# Patient Record
Sex: Female | Born: 1935
Health system: Southern US, Community
[De-identification: ages and names within clinical notes are randomized; demographics above are authoritative.]

## PROBLEM LIST (undated history)

## (undated) DIAGNOSIS — E7800 Pure hypercholesterolemia, unspecified: Secondary | ICD-10-CM

## (undated) DIAGNOSIS — F0390 Unspecified dementia without behavioral disturbance: Secondary | ICD-10-CM

## (undated) DIAGNOSIS — C801 Malignant (primary) neoplasm, unspecified: Secondary | ICD-10-CM

## (undated) DIAGNOSIS — F419 Anxiety disorder, unspecified: Secondary | ICD-10-CM

## (undated) DIAGNOSIS — G4733 Obstructive sleep apnea (adult) (pediatric): Secondary | ICD-10-CM

## (undated) DIAGNOSIS — F329 Major depressive disorder, single episode, unspecified: Secondary | ICD-10-CM

## (undated) DIAGNOSIS — N189 Chronic kidney disease, unspecified: Secondary | ICD-10-CM

## (undated) DIAGNOSIS — M8580 Other specified disorders of bone density and structure, unspecified site: Secondary | ICD-10-CM

## (undated) DIAGNOSIS — I499 Cardiac arrhythmia, unspecified: Secondary | ICD-10-CM

## (undated) DIAGNOSIS — M1990 Unspecified osteoarthritis, unspecified site: Secondary | ICD-10-CM

## (undated) DIAGNOSIS — F32A Depression, unspecified: Secondary | ICD-10-CM

## (undated) DIAGNOSIS — R413 Other amnesia: Secondary | ICD-10-CM

## (undated) HISTORY — DX: Chronic kidney disease, unspecified: N18.9

## (undated) HISTORY — PX: BUNIONECTOMY: SHX129

## (undated) HISTORY — DX: Pure hypercholesterolemia, unspecified: E78.00

## (undated) HISTORY — DX: Obstructive sleep apnea (adult) (pediatric): G47.33

## (undated) HISTORY — DX: Other amnesia: R41.3

## (undated) HISTORY — PX: ANKLE SURGERY: SHX546

## (undated) HISTORY — PX: FRACTURE SURGERY: SHX138

## (undated) HISTORY — DX: Other specified disorders of bone density and structure, unspecified site: M85.80

---
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
## 2009-12-26 ENCOUNTER — Ambulatory Visit (HOSPITAL_COMMUNITY): Admission: RE | Admit: 2009-12-26 | Discharge: 2009-12-26 | Payer: Self-pay | Admitting: Family Medicine

## 2011-01-17 ENCOUNTER — Other Ambulatory Visit (HOSPITAL_COMMUNITY): Payer: Self-pay | Admitting: Family Medicine

## 2011-01-17 DIAGNOSIS — Z1231 Encounter for screening mammogram for malignant neoplasm of breast: Secondary | ICD-10-CM

## 2011-01-29 ENCOUNTER — Ambulatory Visit (HOSPITAL_COMMUNITY)
Admission: RE | Admit: 2011-01-29 | Discharge: 2011-01-29 | Disposition: A | Discharge status: Home / Self Care | Payer: Medicare (Managed Care) | Source: Ambulatory Visit | Attending: Family Medicine | Admitting: Family Medicine

## 2011-01-29 DIAGNOSIS — Z1231 Encounter for screening mammogram for malignant neoplasm of breast: Secondary | ICD-10-CM | POA: Insufficient documentation

## 2012-03-17 ENCOUNTER — Other Ambulatory Visit: Payer: Self-pay | Admitting: Family Medicine

## 2012-03-17 DIAGNOSIS — Z1231 Encounter for screening mammogram for malignant neoplasm of breast: Secondary | ICD-10-CM

## 2012-03-30 ENCOUNTER — Ambulatory Visit: Payer: Medicare (Managed Care)

## 2012-03-31 ENCOUNTER — Ambulatory Visit
Admission: RE | Admit: 2012-03-31 | Discharge: 2012-03-31 | Disposition: A | Discharge status: Home / Self Care | Payer: Medicare Other | Source: Ambulatory Visit | Attending: Family Medicine | Admitting: Family Medicine

## 2012-03-31 DIAGNOSIS — Z1231 Encounter for screening mammogram for malignant neoplasm of breast: Secondary | ICD-10-CM

## 2014-06-14 ENCOUNTER — Other Ambulatory Visit: Payer: Self-pay | Admitting: Family Medicine

## 2014-06-14 DIAGNOSIS — Z1231 Encounter for screening mammogram for malignant neoplasm of breast: Secondary | ICD-10-CM

## 2014-06-29 ENCOUNTER — Ambulatory Visit
Admission: RE | Admit: 2014-06-29 | Discharge: 2014-06-29 | Disposition: A | Discharge status: Home / Self Care | Payer: Medicare Other | Source: Ambulatory Visit | Attending: Family Medicine | Admitting: Family Medicine

## 2014-06-29 DIAGNOSIS — Z1231 Encounter for screening mammogram for malignant neoplasm of breast: Secondary | ICD-10-CM

## 2015-06-30 ENCOUNTER — Other Ambulatory Visit: Payer: Self-pay | Admitting: Family Medicine

## 2015-06-30 DIAGNOSIS — E2839 Other primary ovarian failure: Secondary | ICD-10-CM

## 2015-07-07 ENCOUNTER — Ambulatory Visit
Admission: RE | Admit: 2015-07-07 | Discharge: 2015-07-07 | Disposition: A | Discharge status: Home / Self Care | Payer: Medicare Other | Source: Ambulatory Visit | Attending: Family Medicine | Admitting: Family Medicine

## 2015-07-07 DIAGNOSIS — E2839 Other primary ovarian failure: Secondary | ICD-10-CM

## 2016-12-13 ENCOUNTER — Encounter: Payer: Self-pay | Admitting: *Deleted

## 2016-12-16 ENCOUNTER — Encounter: Payer: Self-pay | Admitting: Diagnostic Neuroimaging

## 2016-12-16 ENCOUNTER — Ambulatory Visit: Payer: Medicare Other | Admitting: Diagnostic Neuroimaging

## 2017-01-08 ENCOUNTER — Encounter: Payer: Self-pay | Admitting: Diagnostic Neuroimaging

## 2017-01-08 ENCOUNTER — Ambulatory Visit (INDEPENDENT_AMBULATORY_CARE_PROVIDER_SITE_OTHER): Payer: PPO | Admitting: Diagnostic Neuroimaging

## 2017-01-08 VITALS — BP 140/84 | HR 86 | Ht 63.0 in | Wt 220.0 lb

## 2017-01-08 DIAGNOSIS — R413 Other amnesia: Secondary | ICD-10-CM | POA: Diagnosis not present

## 2017-01-08 DIAGNOSIS — F03A0 Unspecified dementia, mild, without behavioral disturbance, psychotic disturbance, mood disturbance, and anxiety: Secondary | ICD-10-CM

## 2017-01-08 DIAGNOSIS — F0390 Unspecified dementia without behavioral disturbance: Secondary | ICD-10-CM

## 2017-01-08 NOTE — Patient Instructions (Addendum)
Thank you for coming to see [place] at [facility]. I hope we have been able to provide you high quality care today.  You may receive a patient satisfaction survey over the next few weeks. We would appreciate your feedback and comments so that we may continue to improve ourselves and the health of our patients.   - consider donepezil or memantine  - consider research study  - safety and supervision issues reviewed  - caution with driving; probably to should transition to stop driving   ~~~~~~~~~~~~~~~~~~~~~~~~~~~~~~~~~~~~~~~~~~~~~~~~~~~~~~~~~~~~~~~~~  DR. PENUMALLI'S GUIDE TO HAPPY AND HEALTHY LIVING These are some of my general health and wellness recommendations. Some of them may apply to you better than others. Please use common sense as you try these suggestions and feel free to ask me any questions.   ACTIVITY/FITNESS Mental, social, emotional and physical stimulation are very important for brain and body health. Try learning a new activity (arts, music, language, sports, games).  Keep moving your body to the best of your abilities. You can do this at home, inside or outside, the park, community center, gym or anywhere you like. Consider a physical therapist or personal trainer to get started. Consider the app Sworkit. Fitness trackers such as smart-watches, smart-phones or Fitbits can help as well.   NUTRITION Eat more plants: colorful vegetables, nuts, seeds and berries.  Eat less sugar, salt, preservatives and processed foods.  Avoid toxins such as cigarettes and alcohol.  Drink water when you are thirsty. Warm water with a slice of lemon is an excellent morning drink to start the day.  Consider these websites for more information The Nutrition Source (https://www.henry-hernandez.biz/) Precision Nutrition (WindowBlog.ch)   RELAXATION Consider practicing mindfulness meditation or other relaxation techniques such  as deep breathing, prayer, yoga, tai chi, massage. See website mindful.org or the apps Headspace or Calm to help get started.   SLEEP Try to get at least 7-8+ hours sleep per day. Regular exercise and reduced caffeine will help you sleep better. Practice good sleep hygeine techniques. See website sleep.org for more information.   PLANNING Prepare estate planning, living will, healthcare POA documents. Sometimes this is best planned with the help of an attorney. Theconversationproject.org and agingwithdignity.org are excellent resources.

## 2017-01-08 NOTE — Progress Notes (Signed)
[facility]  PATIENT: Laura Walker DOB: 1930/01/08  REFERRING CLINICIAN: S Wolters HISTORY FROM: patient and daughter [place]) REASON FOR VISIT: new consult / existing patient    HISTORICAL  CHIEF COMPLAINT:  Chief Complaint  Patient presents with  . Dementia    rm 7,  New Pt, dgtr- Libbie, MMSE 27    HISTORY OF PRESENT ILLNESS:   UPDATE 01/08/17: 83 year old female here for evaluation of dementia. Since last visit, patient has had some progression of memory loss over last 5-6 years. This is noted by daughters and son. Still driving short distances. Family slightly concerned about safety, but no accidents recently. Able to do most of her ADLs, but some reminders about hygiene and dressing. Daughter takes care of bills and finances. Patient's other daughter who lives in [place], was visiting a few months ago and concerned about patient's memory problems and dementia symptoms.  PRIOR HPI 10/21/11 (VRP): 83 year old right-handed female with history of hyperlipidemia, seasonal allergies, arthritis, here for evaluation of memory loss.  Patient was previously living independently in [place], [place], then 2 yrs ago moved in with her daughter here in [place].  Around the same time, she and her daughter have noted mild, intermittent short-term memory loss. For example she has difficulty remembering appointment dates, times, names of actors.  She is still able to maintain her activities of daily living.   REVIEW OF SYSTEMS: Full 14 system review of systems performed and negative with exception of: memory loss.   ALLERGIES: Allergies  Allergen Reactions  . Tramadol Nausea Only    HOME MEDICATIONS: No outpatient prescriptions prior to visit.   No facility-administered medications prior to visit.     PAST MEDICAL HISTORY: Past Medical History:  Diagnosis Date  . CKD (chronic kidney disease)   . Hypercholesteremia   . OSA (obstructive sleep apnea)    CPAP    . Osteopenia     PAST SURGICAL HISTORY: Past Surgical History:  Procedure Laterality Date  . ANKLE SURGERY Left    as child, fracture  . BUNIONECTOMY      FAMILY HISTORY: Family History  Problem Relation Age of Onset  . Cancer - Lung Mother   . Heart disease Father   . Atrial fibrillation Sister     SOCIAL HISTORY:  Social History   Social History  . Marital status: Widowed    Spouse name: N/A  . Number of children: 5  . Years of education: 32   Occupational History  .      retired Furniture conservator/restorer   Social History Main Topics  . Smoking status: Former Smoker    Quit date: 12/13/1984  . Smokeless tobacco: Never Used  . Alcohol use Yes     Comment: occas wine  . Drug use: No  . Sexual activity: Not on file   Other Topics Concern  . Not on file   Social History Narrative   Lives with daughter   Caffeine- coffee 2 cups, occas tea     PHYSICAL EXAM  GENERAL EXAM/CONSTITUTIONAL: Vitals:  Vitals:   01/08/17 1507  BP: 140/84  Pulse: 86  Weight: 220 lb (99.8 kg)  Height: '5\' 3"'$  (1.6 m)     Body mass index is 38.97 kg/m.  Visual Acuity Screening   Right eye Left eye Both eyes  Without correction:     With correction: 20/50 20/70   Comments: trifocals    Patient is in no distress; well developed, nourished and groomed; neck is supple  CARDIOVASCULAR:  Examination of carotid arteries is normal; no carotid bruits  Regular rate and rhythm, no murmurs  Examination of peripheral vascular system by observation and palpation is normal  EYES:  Ophthalmoscopic exam of optic discs and posterior segments is normal; no papilledema or hemorrhages  MUSCULOSKELETAL:  Gait, strength, tone, movements noted in Neurologic exam below  NEUROLOGIC: MENTAL STATUS:  MMSE - Mini Mental State Exam 01/08/2017  Orientation to time 4  Orientation to Place 5  Registration 3  Attention/ Calculation 4  Recall 2  Language- name 2 objects 2  Language- repeat 1   Language- follow 3 step command 3  Language- read & follow direction 1  Write a sentence 1  Copy design 1  Total score 27    awake, alert, oriented to person, place and time  recent and remote memory intact  normal attention and concentration  language fluent, comprehension intact, naming intact,   fund of knowledge appropriate  CRANIAL NERVE:   2nd - no papilledema on fundoscopic exam  2nd, 3rd, 4th, 6th - pupils equal and reactive to light, visual fields full to confrontation, extraocular muscles intact, no nystagmus  5th - facial sensation symmetric  7th - facial strength symmetric  8th - hearing intact  9th - palate elevates symmetrically, uvula midline  11th - shoulder shrug symmetric  12th - tongue protrusion midline  MOTOR:   normal bulk and tone, full strength in the BUE, BLE  SENSORY:   normal and symmetric to light touch, temperature, vibration  COORDINATION:   finger-nose-finger, fine finger movements normal  REFLEXES:   deep tendon reflexes TRACE and symmetric  GAIT/STATION:   narrow based gait    DIAGNOSTIC DATA (LABS, IMAGING, TESTING) - I reviewed patient records, labs, notes, testing and imaging myself where available.  No results found for: WBC, HGB, HCT, MCV, PLT No results found for: NA, K, CL, CO2, GLUCOSE, BUN, CREATININE, CALCIUM, PROT, ALBUMIN, AST, ALT, ALKPHOS, BILITOT, GFRNONAA, GFRAA No results found for: CHOL, HDL, LDLCALC, LDLDIRECT, TRIG, CHOLHDL No results found for: HGBA1C No results found for: VITAMINB12 No results found for: TSH   11/07/11  MRI brain (without contrast) demonstrating: 1. Mild perisylvian and mesial temporal atrophy.   2. Moderate chronic small vessel ischemic disease.      ASSESSMENT AND PLAN  83 y.o. year old female here with progressive mild memory loss and cognitive decline since 2010. Patient having some decline in her ADLs. Patient living with daughter in a good supervised and safe  environment. Had long conversation regarding diagnosis, prognosis, treatment options.   Dx: mild dementia  1. Memory loss   2. Mild dementia      PLAN: - consider donepezil or memantine - consider research study - safety and supervision issues reviewed - caution with driving; probably to should transition to stop driving  Return in about 3 months (around 04/07/2017).    Penni Bombard, MD 2/69/4854, 6:27 PM Certified in Neurology, Neurophysiology and Neuroimaging  [facility] [address] [place], [place]  [address] [phone]

## 2017-02-12 DIAGNOSIS — G4733 Obstructive sleep apnea (adult) (pediatric): Secondary | ICD-10-CM | POA: Diagnosis not present

## 2017-02-14 DIAGNOSIS — E559 Vitamin D deficiency, unspecified: Secondary | ICD-10-CM | POA: Diagnosis not present

## 2017-04-15 ENCOUNTER — Ambulatory Visit (INDEPENDENT_AMBULATORY_CARE_PROVIDER_SITE_OTHER): Payer: PPO | Admitting: Diagnostic Neuroimaging

## 2017-04-15 ENCOUNTER — Encounter: Payer: Self-pay | Admitting: Diagnostic Neuroimaging

## 2017-04-15 VITALS — BP 152/84 | HR 71 | Wt 219.6 lb

## 2017-04-15 DIAGNOSIS — R413 Other amnesia: Secondary | ICD-10-CM

## 2017-04-15 DIAGNOSIS — F03A0 Unspecified dementia, mild, without behavioral disturbance, psychotic disturbance, mood disturbance, and anxiety: Secondary | ICD-10-CM

## 2017-04-15 DIAGNOSIS — F0390 Unspecified dementia without behavioral disturbance: Secondary | ICD-10-CM | POA: Diagnosis not present

## 2017-04-15 NOTE — Patient Instructions (Signed)
-   consider research study; (www.[email or web address].clinic for more information)  - caution with driving

## 2017-04-15 NOTE — Progress Notes (Signed)
[facility]  PATIENT: Laura Walker DOB: 1929/12/01  REFERRING CLINICIAN: S Wolters HISTORY FROM: patient REASON FOR VISIT: follow up    HISTORICAL  CHIEF COMPLAINT:  Chief Complaint  Patient presents with  . Memory Loss    rm 7, MMSE 27  . Follow-up    3 month    HISTORY OF PRESENT ILLNESS:   UPDATE 04/15/17: Since last visit, symptoms are stable. Patient here alone for this visit. No new issues or concerns.   UPDATE 01/08/17: 83 year old female here for evaluation of dementia. Since last visit, patient has had some progression of memory loss over last 5-6 years. This is noted by daughters and son. Still driving short distances. Family slightly concerned about safety, but no accidents recently. Able to do most of her ADLs, but some reminders about hygiene and dressing. Daughter takes care of bills and finances. Patient's other daughter who lives in [place], was visiting a few months ago and concerned about patient's memory problems and dementia symptoms.  PRIOR HPI 10/21/11 (VRP): 84 year old right-handed female with history of hyperlipidemia, seasonal allergies, arthritis, here for evaluation of memory loss.  Patient was previously living independently in [place], [place], then 2 yrs ago moved in with her daughter here in [place].  Around the same time, she and her daughter have noted mild, intermittent short-term memory loss. For example she has difficulty remembering appointment dates, times, names of actors.  She is still able to maintain her activities of daily living.   REVIEW OF SYSTEMS: Full 14 system review of systems performed and negative with exception of: memory loss.   ALLERGIES: Allergies  Allergen Reactions  . Tramadol Nausea Only    HOME MEDICATIONS: Outpatient Medications Prior to Visit  Medication Sig Dispense Refill  . Cholecalciferol (VITAMIN D3) 50000 units CAPS Take by mouth.    . Cyanocobalamin (VITAMIN B-12) 5000 MCG LOZG  Take 2,500 mcg by mouth.    . DULoxetine (CYMBALTA) 30 MG capsule 30 mg daily.    . Omega-3 Fatty Acids (FISH OIL ADULT GUMMIES PO) Take by mouth. 1400/900 mg     No facility-administered medications prior to visit.     PAST MEDICAL HISTORY: Past Medical History:  Diagnosis Date  . CKD (chronic kidney disease)   . Hypercholesteremia   . Memory loss   . OSA (obstructive sleep apnea)    CPAP  . Osteopenia     PAST SURGICAL HISTORY: Past Surgical History:  Procedure Laterality Date  . ANKLE SURGERY Left    as child, fracture  . BUNIONECTOMY      FAMILY HISTORY: Family History  Problem Relation Age of Onset  . Cancer - Lung Mother   . Heart disease Father   . Atrial fibrillation Sister     SOCIAL HISTORY:  Social History   Social History  . Marital status: Widowed    Spouse name: N/A  . Number of children: 5  . Years of education: 34   Occupational History  .      retired Furniture conservator/restorer   Social History Main Topics  . Smoking status: Former Smoker    Quit date: 12/13/1984  . Smokeless tobacco: Never Used  . Alcohol use Yes     Comment: occas wine  . Drug use: No  . Sexual activity: Not on file   Other Topics Concern  . Not on file   Social History Narrative   Lives with daughter   Caffeine- coffee 2 cups, occas tea  PHYSICAL EXAM  GENERAL EXAM/CONSTITUTIONAL: Vitals:  Vitals:   04/15/17 1508  BP: (!) 152/84  Pulse: 71  Weight: 219 lb 9.6 oz (99.6 kg)   Body mass index is 38.9 kg/m. No exam data present  Patient is in no distress; well developed, nourished and groomed; neck is supple  CARDIOVASCULAR:  Examination of carotid arteries is normal; no carotid bruits  Regular rate and rhythm, no murmurs  Examination of peripheral vascular system by observation and palpation is normal  EYES:  Ophthalmoscopic exam of optic discs and posterior segments is normal; no papilledema or hemorrhages  MUSCULOSKELETAL:  Gait, strength,  tone, movements noted in Neurologic exam below  NEUROLOGIC: MENTAL STATUS:  MMSE - [place] Exam 04/15/2017 01/08/2017  Orientation to time 5 4  Orientation to Place 5 5  Registration 3 3  Attention/ Calculation 3 4  Recall 2 2  Language- name 2 objects 2 2  Language- repeat 1 1  Language- follow 3 step command 3 3  Language- read & follow direction 1 1  Write a sentence 1 1  Copy design 1 1  Total score 27 27    awake, alert, oriented to person, place and time  recent and remote memory intact  normal attention and concentration  language fluent, comprehension intact, naming intact,   fund of knowledge appropriate  CRANIAL NERVE:   2nd - no papilledema on fundoscopic exam  2nd, 3rd, 4th, 6th - pupils equal and reactive to light, visual fields full to confrontation, extraocular muscles intact, no nystagmus  5th - facial sensation symmetric  7th - facial strength symmetric  8th - hearing intact  9th - palate elevates symmetrically, uvula midline  11th - shoulder shrug symmetric  12th - tongue protrusion midline  MOTOR:   normal bulk and tone, full strength in the BUE, BLE  SENSORY:   normal and symmetric to light touch, temperature  DEC VIB AT HANDS  ABSENT VIB AT TOES  COORDINATION:   finger-nose-finger, fine finger movements normal  REFLEXES:   deep tendon reflexes TRACE and symmetric  GAIT/STATION:   narrow based gait    DIAGNOSTIC DATA (LABS, IMAGING, TESTING) - I reviewed patient records, labs, notes, testing and imaging myself where available.  No results found for: WBC, HGB, HCT, MCV, PLT No results found for: NA, K, CL, CO2, GLUCOSE, BUN, CREATININE, CALCIUM, PROT, ALBUMIN, AST, ALT, ALKPHOS, BILITOT, GFRNONAA, GFRAA No results found for: CHOL, HDL, LDLCALC, LDLDIRECT, TRIG, CHOLHDL No results found for: HGBA1C No results found for: VITAMINB12 No results found for: TSH   11/07/11  MRI brain (without contrast)  demonstrating: 1. Mild perisylvian and mesial temporal atrophy.   2. Moderate chronic small vessel ischemic disease.      ASSESSMENT AND PLAN  83 y.o. year old female here with progressive mild memory loss and cognitive decline since 2010. Patient having some decline in her ADLs. Patient living with daughter in a good supervised and safe environment. Had long conversation regarding diagnosis, prognosis, treatment options.   Dx: mild dementia  1. Memory loss   2. Mild dementia      PLAN: I spent 15 minutes of face to face time with patient. Greater than 50% of time was spent in counseling and coordination of care with patient. In summary we discussed:  - consider research study; patient is interested - safety and supervision issues reviewed - caution with driving; probably to should transition to stop driving  Return in about 4 months (around 08/16/2017). with  daughter     Penni Bombard, MD 05/05/6434, 3:91 PM Certified in Neurology, Neurophysiology and [facility] [address] [place], [place] [address] [phone]

## 2017-07-30 DIAGNOSIS — D692 Other nonthrombocytopenic purpura: Secondary | ICD-10-CM | POA: Diagnosis not present

## 2017-07-30 DIAGNOSIS — E785 Hyperlipidemia, unspecified: Secondary | ICD-10-CM | POA: Diagnosis not present

## 2017-07-30 DIAGNOSIS — Z6838 Body mass index (BMI) 38.0-38.9, adult: Secondary | ICD-10-CM | POA: Diagnosis not present

## 2017-07-30 DIAGNOSIS — Z23 Encounter for immunization: Secondary | ICD-10-CM | POA: Diagnosis not present

## 2017-07-30 DIAGNOSIS — E559 Vitamin D deficiency, unspecified: Secondary | ICD-10-CM | POA: Diagnosis not present

## 2017-07-30 DIAGNOSIS — M1990 Unspecified osteoarthritis, unspecified site: Secondary | ICD-10-CM | POA: Diagnosis not present

## 2017-07-30 DIAGNOSIS — Z79899 Other long term (current) drug therapy: Secondary | ICD-10-CM | POA: Diagnosis not present

## 2017-07-30 DIAGNOSIS — Z0000 Encounter for general adult medical examination without abnormal findings: Secondary | ICD-10-CM | POA: Diagnosis not present

## 2017-07-30 DIAGNOSIS — I10 Essential (primary) hypertension: Secondary | ICD-10-CM | POA: Diagnosis not present

## 2017-08-18 ENCOUNTER — Ambulatory Visit (INDEPENDENT_AMBULATORY_CARE_PROVIDER_SITE_OTHER): Payer: PPO | Admitting: Diagnostic Neuroimaging

## 2017-08-18 ENCOUNTER — Encounter: Payer: Self-pay | Admitting: Diagnostic Neuroimaging

## 2017-08-18 VITALS — BP 164/81 | HR 70 | Ht 63.0 in | Wt 227.0 lb

## 2017-08-18 DIAGNOSIS — R269 Unspecified abnormalities of gait and mobility: Secondary | ICD-10-CM | POA: Diagnosis not present

## 2017-08-18 DIAGNOSIS — R413 Other amnesia: Secondary | ICD-10-CM | POA: Diagnosis not present

## 2017-08-18 DIAGNOSIS — F0390 Unspecified dementia without behavioral disturbance: Secondary | ICD-10-CM

## 2017-08-18 DIAGNOSIS — F03A0 Unspecified dementia, mild, without behavioral disturbance, psychotic disturbance, mood disturbance, and anxiety: Secondary | ICD-10-CM

## 2017-08-18 MED ORDER — MEMANTINE HCL 10 MG PO TABS: 10.0000 mg | ORAL_TABLET | Freq: Two times a day (BID) | ORAL | 12 refills | Status: DC

## 2017-08-18 NOTE — Patient Instructions (Signed)
Thank you for coming to see [place] at [facility]. I hope we have been able to provide you high quality care today.  You may receive a patient satisfaction survey over the next few weeks. We would appreciate your feedback and comments so that we may continue to improve ourselves and the health of our patients.  - start memantine 45m at bedtime; after 1-2 weeks increase to twice a day  - visit [email or web address].[email or web address] for more information on research studies   ~~~~~~~~~~~~~~~~~~~~~~~~~~~~~~~~~~~~~~~~~~~~~~~~~~~~~~~~~~~~~~~~~  DR. Zeppelin Commisso'S GUIDE TO HAPPY AND HEALTHY LIVING These are some of my general health and wellness recommendations. Some of them may apply to you better than others. Please use common sense as you try these suggestions and feel free to ask me any questions.   ACTIVITY/FITNESS Mental, social, emotional and physical stimulation are very important for brain and body health. Try learning a new activity (arts, music, language, sports, games).  Keep moving your body to the best of your abilities.    NUTRITION Eat more plants: colorful vegetables, nuts, seeds and berries.  Eat less sugar, salt, preservatives and processed foods.  Avoid toxins such as cigarettes and alcohol.  Drink water when you are thirsty. Warm water with a slice of lemon is an excellent morning drink to start the day.  Consider these websites for more information The Nutrition Source (hhttps://www.henry-hernandez.biz/ Precision Nutrition (wWindowBlog.ch   RELAXATION Consider practicing mindfulness meditation or other relaxation techniques such as deep breathing, prayer, yoga, tai chi, massage. See website mindful.org or the apps Headspace or Calm to help get started.   SLEEP Try to get at least 7-8+ hours sleep per day. Regular exercise and reduced caffeine will help you sleep better. Practice good sleep hygeine techniques. See website sleep.org for  more information.   PLANNING Prepare estate planning, living will, healthcare POA documents. Sometimes this is best planned with the help of an attorney. Theconversationproject.org and agingwithdignity.org are excellent resources.

## 2017-08-18 NOTE — Progress Notes (Signed)
[facility]  PATIENT: Laura Walker DOB: December 04, 1929  REFERRING CLINICIAN: S Wolters HISTORY FROM: patient and 2 daughters  REASON FOR VISIT: follow up    HISTORICAL  CHIEF COMPLAINT:  Chief Complaint  Patient presents with  . Follow-up  . Memory Loss    stable. 2 daughters with pt.  Had fall, balance.     HISTORY OF PRESENT ILLNESS:   UPDAET 08/18/17: Since last visit, memory loss is stable. She has excellent support from her 2 daughters. Slightly decr insight. She had 1 fall recently, while trying to get up from chair, then right knee gave out slightly. Her daughter was able to slow her fall. No major injuries. Patient using cane most of the time, but not the walker that much.   UPDATE 04/15/17: Since last visit, symptoms are stable. Patient here alone for this visit. No new issues or concerns.   UPDATE 01/08/17: 83 year old female here for evaluation of dementia. Since last visit, patient has had some progression of memory loss over last 5-6 years. This is noted by daughters and son. Still driving short distances. Family slightly concerned about safety, but no accidents recently. Able to do most of her ADLs, but some reminders about hygiene and dressing. Daughter takes care of bills and finances. Patient's other daughter who lives in [place], was visiting a few months ago and concerned about patient's memory problems and dementia symptoms.  PRIOR HPI 10/21/11 (VRP): 83 year old right-handed female with history of hyperlipidemia, seasonal allergies, arthritis, here for evaluation of memory loss.  Patient was previously living independently in [place], [place], then 2 yrs ago moved in with her daughter here in [place].  Around the same time, she and her daughter have noted mild, intermittent short-term memory loss. For example she has difficulty remembering appointment dates, times, names of actors.  She is still able to maintain her activities of daily  living.   REVIEW OF SYSTEMS: Full 14 system review of systems performed and negative with exception of: joint pain incont bladder apnea.     ALLERGIES: Allergies  Allergen Reactions  . Tramadol Nausea Only    HOME MEDICATIONS: Outpatient Medications Prior to Visit  Medication Sig Dispense Refill  . Cyanocobalamin (VITAMIN B-12) 5000 MCG LOZG Take 2,500 mcg by mouth.    . DULoxetine (CYMBALTA) 30 MG capsule 30 mg daily.    . Omega-3 Fatty Acids (FISH OIL ADULT GUMMIES PO) Take by mouth. 1400/900 mg    . Cholecalciferol (VITAMIN D3) 50000 units CAPS Take by mouth.     No facility-administered medications prior to visit.     PAST MEDICAL HISTORY: Past Medical History:  Diagnosis Date  . CKD (chronic kidney disease)   . Hypercholesteremia   . Memory loss   . OSA (obstructive sleep apnea)    CPAP  . Osteopenia     PAST SURGICAL HISTORY: Past Surgical History:  Procedure Laterality Date  . ANKLE SURGERY Left    as child, fracture  . BUNIONECTOMY      FAMILY HISTORY: Family History  Problem Relation Age of Onset  . Cancer - Lung Mother   . Heart disease Father   . Atrial fibrillation Sister     SOCIAL HISTORY:  Social History   Social History  . Marital status: Widowed    Spouse name: N/A  . Number of children: 5  . Years of education: 59   Occupational History  .      retired Furniture conservator/restorer   Social History Main  Topics  . Smoking status: Former Smoker    Quit date: 12/13/1984  . Smokeless tobacco: Never Used  . Alcohol use Yes     Comment: occas wine  . Drug use: No  . Sexual activity: Not on file   Other Topics Concern  . Not on file   Social History Narrative   Lives with daughter   Caffeine- coffee 2 cups, occas tea     PHYSICAL EXAM  GENERAL EXAM/CONSTITUTIONAL: Vitals:  Vitals:   08/18/17 1400  BP: (!) 164/81  Pulse: 70  Weight: 227 lb (103 kg)  Height: 5\' 3"  (1.6 m)   Body mass index is 40.21 kg/m. No exam data  present  Patient is in no distress; well developed, nourished and groomed; neck is supple  CARDIOVASCULAR:  Examination of carotid arteries is normal; no carotid bruits  Regular rate and rhythm, no murmurs  Examination of peripheral vascular system by observation and palpation is normal  EYES:  Ophthalmoscopic exam of optic discs and posterior segments is normal; no papilledema or hemorrhages  MUSCULOSKELETAL:  Gait, strength, tone, movements noted in Neurologic exam below  NEUROLOGIC: MENTAL STATUS:  MMSE - [place] Exam 08/18/2017 04/15/2017 01/08/2017  Orientation to time 5 5 4   Orientation to Place 5 5 5   Registration 3 3 3   Attention/ Calculation 2 3 4   Recall 2 2 2   Language- name 2 objects 2 2 2   Language- repeat 1 1 1   Language- follow 3 step command 3 3 3   Language- read & follow direction 1 1 1   Write a sentence 1 1 1   Copy design 1 1 1   Total score 26 27 27     awake, alert, oriented to person, place and time  recent and remote memory intact  normal attention and concentration  language fluent, comprehension intact, naming intact,   fund of knowledge appropriate  CRANIAL NERVE:   2nd - no papilledema on fundoscopic exam  2nd, 3rd, 4th, 6th - pupils equal and reactive to light, visual fields full to confrontation, extraocular muscles intact, no nystagmus  5th - facial sensation symmetric  7th - facial strength symmetric  8th - hearing intact  9th - palate elevates symmetrically, uvula midline  11th - shoulder shrug symmetric  12th - tongue protrusion midline  MOTOR:   normal bulk and tone, full strength in the BUE, BLE  SENSORY:   normal and symmetric to light touch, temperature  DEC VIB AT HANDS  ABSENT VIB AT TOES  COORDINATION:   finger-nose-finger, fine finger movements normal  REFLEXES:   deep tendon reflexes TRACE and symmetric  GAIT/STATION:   narrow based gait    DIAGNOSTIC DATA (LABS, IMAGING,  TESTING) - I reviewed patient records, labs, notes, testing and imaging myself where available.  No results found for: WBC, HGB, HCT, MCV, PLT No results found for: NA, K, CL, CO2, GLUCOSE, BUN, CREATININE, CALCIUM, PROT, ALBUMIN, AST, ALT, ALKPHOS, BILITOT, GFRNONAA, GFRAA No results found for: CHOL, HDL, LDLCALC, LDLDIRECT, TRIG, CHOLHDL No results found for: HGBA1C No results found for: VITAMINB12 No results found for: TSH   11/07/11  MRI brain (without contrast) demonstrating: 1. Mild perisylvian and mesial temporal atrophy.   2. Moderate chronic small vessel ischemic disease.      ASSESSMENT AND PLAN  83 y.o. year old female here with progressive mild memory loss and cognitive decline since 2010. Patient having some decline in her ADLs. Patient living with daughter in a good supervised and safe environment.  Had long conversation regarding diagnosis, prognosis, treatment options.   Dx: mild dementia  1. Memory loss   2. Mild dementia   3. Gait difficulty      PLAN:  I spent 25 minutes of face to face time with patient. Greater than 50% of time was spent in counseling and coordination of care with patient. In summary we discussed:   MILD DEMENTIA - start memantine 10mg  twice a day  - consider research study; patient and family are interested - safety and supervision issues reviewed; use walker - caution with driving; probably to should transition to stop driving  HYPERTENSION - follow up BP with PCP   Meds ordered this encounter  Medications  . memantine (NAMENDA) 10 MG tablet    Sig: Take 1 tablet (10 mg total) by mouth 2 (two) times daily.    Dispense:  60 tablet    Refill:  12   Return in about 6 months (around 02/15/2018).    Penni Bombard, MD 07/08/4817, 5:63 PM Certified in Neurology, Neurophysiology and Neuroimaging  [facility] [address] [place], [place] [address] [phone]

## 2017-09-30 DIAGNOSIS — G4733 Obstructive sleep apnea (adult) (pediatric): Secondary | ICD-10-CM | POA: Diagnosis not present

## 2017-11-29 DIAGNOSIS — I129 Hypertensive chronic kidney disease with stage 1 through stage 4 chronic kidney disease, or unspecified chronic kidney disease: Secondary | ICD-10-CM | POA: Diagnosis not present

## 2017-11-29 DIAGNOSIS — R7303 Prediabetes: Secondary | ICD-10-CM | POA: Diagnosis not present

## 2017-11-29 DIAGNOSIS — D692 Other nonthrombocytopenic purpura: Secondary | ICD-10-CM | POA: Diagnosis not present

## 2017-11-29 DIAGNOSIS — M19071 Primary osteoarthritis, right ankle and foot: Secondary | ICD-10-CM | POA: Diagnosis not present

## 2017-11-29 DIAGNOSIS — Z87891 Personal history of nicotine dependence: Secondary | ICD-10-CM | POA: Diagnosis not present

## 2017-11-29 DIAGNOSIS — F0390 Unspecified dementia without behavioral disturbance: Secondary | ICD-10-CM | POA: Diagnosis not present

## 2017-11-29 DIAGNOSIS — N2581 Secondary hyperparathyroidism of renal origin: Secondary | ICD-10-CM | POA: Diagnosis not present

## 2017-11-29 DIAGNOSIS — E7800 Pure hypercholesterolemia, unspecified: Secondary | ICD-10-CM | POA: Diagnosis not present

## 2017-11-29 DIAGNOSIS — Z6841 Body Mass Index (BMI) 40.0 and over, adult: Secondary | ICD-10-CM | POA: Diagnosis not present

## 2017-11-29 DIAGNOSIS — M8580 Other specified disorders of bone density and structure, unspecified site: Secondary | ICD-10-CM | POA: Diagnosis not present

## 2017-11-29 DIAGNOSIS — E559 Vitamin D deficiency, unspecified: Secondary | ICD-10-CM | POA: Diagnosis not present

## 2017-11-29 DIAGNOSIS — N183 Chronic kidney disease, stage 3 (moderate): Secondary | ICD-10-CM | POA: Diagnosis not present

## 2017-11-29 DIAGNOSIS — M170 Bilateral primary osteoarthritis of knee: Secondary | ICD-10-CM | POA: Diagnosis not present

## 2017-11-29 DIAGNOSIS — M19072 Primary osteoarthritis, left ankle and foot: Secondary | ICD-10-CM | POA: Diagnosis not present

## 2017-11-29 DIAGNOSIS — G4733 Obstructive sleep apnea (adult) (pediatric): Secondary | ICD-10-CM | POA: Diagnosis not present

## 2017-11-29 DIAGNOSIS — H9190 Unspecified hearing loss, unspecified ear: Secondary | ICD-10-CM | POA: Diagnosis not present

## 2017-12-02 DIAGNOSIS — W19XXXA Unspecified fall, initial encounter: Secondary | ICD-10-CM | POA: Diagnosis not present

## 2017-12-02 DIAGNOSIS — R5381 Other malaise: Secondary | ICD-10-CM | POA: Diagnosis not present

## 2017-12-02 DIAGNOSIS — E559 Vitamin D deficiency, unspecified: Secondary | ICD-10-CM | POA: Diagnosis not present

## 2017-12-02 DIAGNOSIS — M1990 Unspecified osteoarthritis, unspecified site: Secondary | ICD-10-CM | POA: Diagnosis not present

## 2017-12-02 DIAGNOSIS — Z79899 Other long term (current) drug therapy: Secondary | ICD-10-CM | POA: Diagnosis not present

## 2017-12-02 DIAGNOSIS — F0390 Unspecified dementia without behavioral disturbance: Secondary | ICD-10-CM | POA: Diagnosis not present

## 2017-12-08 ENCOUNTER — Ambulatory Visit (INDEPENDENT_AMBULATORY_CARE_PROVIDER_SITE_OTHER): Payer: PPO | Admitting: Orthopedic Surgery

## 2017-12-08 ENCOUNTER — Encounter (INDEPENDENT_AMBULATORY_CARE_PROVIDER_SITE_OTHER): Payer: Self-pay | Admitting: Orthopedic Surgery

## 2017-12-08 ENCOUNTER — Ambulatory Visit (INDEPENDENT_AMBULATORY_CARE_PROVIDER_SITE_OTHER): Payer: PPO

## 2017-12-08 DIAGNOSIS — M170 Bilateral primary osteoarthritis of knee: Secondary | ICD-10-CM

## 2017-12-08 MED ORDER — BUPIVACAINE HCL 0.25 % IJ SOLN
4.0000 mL | INTRAMUSCULAR | Status: AC | PRN
  Administered 2017-12-08: 4 mL via INTRA_ARTICULAR

## 2017-12-08 MED ORDER — LIDOCAINE HCL 1 % IJ SOLN
5.0000 mL | INTRAMUSCULAR | Status: AC | PRN
  Administered 2017-12-08: 5 mL

## 2017-12-08 MED ORDER — TRIAMCINOLONE ACETONIDE 40 MG/ML IJ SUSP
40.0000 mg | INTRAMUSCULAR | Status: AC | PRN
  Administered 2017-12-08: 40 mg via INTRA_ARTICULAR

## 2017-12-08 NOTE — Progress Notes (Signed)
Office Visit Note   Patient: Laura Walker           Date of Birth: 01-Jun-1930           MRN: [id number] Visit Date: 12/08/2017 Requested by: [name], MD [place], [place] [address] PCP: [name], MD  Subjective: Chief Complaint  Patient presents with  . Left Knee - Pain  . Right Knee - Pain    HPI: Laura Walker is an 84 year old patient with bilateral knee pain right worse than left.  She uses a cane and walker to ambulate.  She had gel injections 8 years ago and gave her good relief but now she is having recurrent pain and presents now for further intervention.  She denies any other interval injuries.  She takes meloxicam at night to help with the pain.  She is here with her daughter.  They do not want to proceed with any type of knee replacement due to the patient's age which is 84 years old.              ROS: All systems reviewed are negative as they relate to the chief complaint within the history of present illness.  Patient denies  fevers or chills.   Assessment & Plan: Visit Diagnoses:  1. Bilateral primary osteoarthritis of knee     Plan: Impression is bilateral knee arthritis with some limitation of walking endurance as would be expected with the severity of the arthritis that she has.  Plan is bilateral knee injections today with cortisone and preapproved for for gel injection.  Once these cortisone shots wear off we will start her with the gel injections and then proceed with a every 3 month regimen of injections into both knees.  Follow-Up Instructions: No Follow-up on file.   Orders:  Orders Placed This Encounter  Procedures  . XR Knee 1-2 Views Right  . XR Knee 1-2 Views Left   No orders of the defined types were placed in this encounter.     Procedures: Large Joint Inj: bilateral knee on 12/08/2017 2:12 PM Indications: diagnostic evaluation, joint swelling and pain Details: 18 G 1.5 in needle, superolateral  approach  Arthrogram: No  Medications (Right): 5 mL lidocaine 1 %; 40 mg triamcinolone acetonide 40 MG/ML; 4 mL bupivacaine 0.25 % Medications (Left): 5 mL lidocaine 1 %; 40 mg triamcinolone acetonide 40 MG/ML; 4 mL bupivacaine 0.25 % Outcome: tolerated well, no immediate complications Procedure, treatment alternatives, risks and benefits explained, specific risks discussed. Consent was given by the patient. Immediately prior to procedure a time out was called to verify the correct patient, procedure, equipment, support staff and site/side marked as required. Patient was prepped and draped in the usual sterile fashion.       Clinical Data: No additional findings.  Objective: Vital Signs: There were no vitals taken for this visit.  Physical Exam:   Constitutional: Patient appears well-developed HEENT:  Head: Normocephalic Eyes:EOM are normal Neck: Normal range of motion Cardiovascular: Normal rate Pulmonary/chest: Effort normal Neurologic: Patient is alert Skin: Skin is warm Psychiatric: Patient has normal mood and affect    Ortho Exam: Orthopedic exam demonstrates perfused feet with good ankle dorsiflexion plantarflexion strength.  Right leg has some valgus alignment.  Extensor mechanism is intact bilaterally with no groin pain with internal and external rotation.  Knees have good range of motion past 90 degrees on both sides but just past 90.  Collateral and cruciate ligaments are stable.  Specialty Comments:  No specialty comments available.  Imaging: Xr Knee 1-2 Views Left  Result Date: 12/08/2017 AP lateral left knee reviewed.  End-stage tricompartmental osteoarthritis is present with spurring noted in all 3 compartments.  There is no fracture or dislocation.  Patellar tendon height is reduced consistent with patellofemoral arthritis which is severe.  Xr Knee 1-2 Views Right  Result Date: 12/08/2017 AP lateral right knee reviewed.  End stage tricompartmental  osteoarthritis is present with most severely affected lateral compartment showing erosions and spurring.  Slight valgus alignment is also noted.  There is no fracture or dislocation.  Posterior spurring is present along with mild arterial calcification.    PMFS History: There are no active problems to display for this patient.  Past Medical History:  Diagnosis Date  . CKD (chronic kidney disease)   . Hypercholesteremia   . Memory loss   . OSA (obstructive sleep apnea)    CPAP  . Osteopenia     Family History  Problem Relation Age of Onset  . Cancer - Lung Mother   . Heart disease Father   . Atrial fibrillation Sister     Past Surgical History:  Procedure Laterality Date  . ANKLE SURGERY Left    as child, fracture  . BUNIONECTOMY     Social History   Occupational History    Comment: retired Furniture conservator/restorer  Tobacco Use  . Smoking status: Former Smoker    Last attempt to quit: 12/13/1984    Years since quitting: 33.0  . Smokeless tobacco: Never Used  Substance and Sexual Activity  . Alcohol use: Yes    Comment: occas wine  . Drug use: No  . Sexual activity: Not on file

## 2017-12-12 ENCOUNTER — Telehealth (INDEPENDENT_AMBULATORY_CARE_PROVIDER_SITE_OTHER): Payer: Self-pay

## 2017-12-12 NOTE — Telephone Encounter (Signed)
IC s/w patient about monovisc injection for bilateral knees that Dr Marlou Sa had discussed with her at previous [place]. Per BV her insurance would cover at 80% leaving her to cover the 20% prior to reaching OOPM. Patient will think about it and call us back if she wishes to proceed.

## 2017-12-15 ENCOUNTER — Ambulatory Visit (INDEPENDENT_AMBULATORY_CARE_PROVIDER_SITE_OTHER): Payer: PPO | Admitting: Orthopedic Surgery

## 2017-12-22 DIAGNOSIS — G4733 Obstructive sleep apnea (adult) (pediatric): Secondary | ICD-10-CM | POA: Diagnosis not present

## 2018-01-22 DIAGNOSIS — G4733 Obstructive sleep apnea (adult) (pediatric): Secondary | ICD-10-CM | POA: Diagnosis not present

## 2018-01-27 DIAGNOSIS — Z1211 Encounter for screening for malignant neoplasm of colon: Secondary | ICD-10-CM | POA: Diagnosis not present

## 2018-01-27 DIAGNOSIS — F0390 Unspecified dementia without behavioral disturbance: Secondary | ICD-10-CM | POA: Diagnosis not present

## 2018-01-27 DIAGNOSIS — N183 Chronic kidney disease, stage 3 (moderate): Secondary | ICD-10-CM | POA: Diagnosis not present

## 2018-01-27 DIAGNOSIS — E785 Hyperlipidemia, unspecified: Secondary | ICD-10-CM | POA: Diagnosis not present

## 2018-01-27 DIAGNOSIS — M1990 Unspecified osteoarthritis, unspecified site: Secondary | ICD-10-CM | POA: Diagnosis not present

## 2018-01-27 DIAGNOSIS — N2581 Secondary hyperparathyroidism of renal origin: Secondary | ICD-10-CM | POA: Diagnosis not present

## 2018-01-27 DIAGNOSIS — Z79899 Other long term (current) drug therapy: Secondary | ICD-10-CM | POA: Diagnosis not present

## 2018-02-17 ENCOUNTER — Ambulatory Visit: Payer: PPO | Admitting: Diagnostic Neuroimaging

## 2018-02-18 ENCOUNTER — Ambulatory Visit: Payer: PPO | Admitting: Diagnostic Neuroimaging

## 2018-02-19 DIAGNOSIS — G4733 Obstructive sleep apnea (adult) (pediatric): Secondary | ICD-10-CM | POA: Diagnosis not present

## 2018-04-13 NOTE — Telephone Encounter (Signed)
Can you f/u on this one and call them?

## 2018-04-13 NOTE — Telephone Encounter (Signed)
Duplicate

## 2018-04-13 NOTE — Telephone Encounter (Signed)
Patients daughter called and her mom is ready to proceed with monovisc injection, she would like an estimate of what the 20% that they would have to cover would be. Please advise daughter [name] # [phone]

## 2018-04-14 ENCOUNTER — Telehealth (INDEPENDENT_AMBULATORY_CARE_PROVIDER_SITE_OTHER): Payer: Self-pay

## 2018-04-14 NOTE — Telephone Encounter (Signed)
Talked with patient's daughter concerning patient's 20% OOP for Monovisc injection, bilateral knee.   Appt.scheduled for 04/23/18.

## 2018-04-14 NOTE — Telephone Encounter (Signed)
Talked with patient's daughter [name] and advised her that patient would be responsible for 20% OOP, which could be an estimate of $350.00.   Covered at 80% Monovisc, Bilateral Knee Buy & Bill Appt.scheduled 04/23/18.

## 2018-04-23 ENCOUNTER — Ambulatory Visit (INDEPENDENT_AMBULATORY_CARE_PROVIDER_SITE_OTHER): Payer: PPO | Admitting: Orthopedic Surgery

## 2018-04-23 ENCOUNTER — Encounter (INDEPENDENT_AMBULATORY_CARE_PROVIDER_SITE_OTHER): Payer: Self-pay | Admitting: Orthopedic Surgery

## 2018-04-23 DIAGNOSIS — M1712 Unilateral primary osteoarthritis, left knee: Secondary | ICD-10-CM | POA: Diagnosis not present

## 2018-04-23 DIAGNOSIS — M1711 Unilateral primary osteoarthritis, right knee: Secondary | ICD-10-CM | POA: Diagnosis not present

## 2018-04-25 ENCOUNTER — Encounter (INDEPENDENT_AMBULATORY_CARE_PROVIDER_SITE_OTHER): Payer: Self-pay | Admitting: Orthopedic Surgery

## 2018-04-25 DIAGNOSIS — M1711 Unilateral primary osteoarthritis, right knee: Secondary | ICD-10-CM

## 2018-04-25 DIAGNOSIS — M1712 Unilateral primary osteoarthritis, left knee: Secondary | ICD-10-CM

## 2018-04-25 MED ORDER — LIDOCAINE HCL 1 % IJ SOLN
5.0000 mL | INTRAMUSCULAR | Status: AC | PRN
  Administered 2018-04-25: 5 mL

## 2018-04-25 MED ORDER — HYALURONAN 88 MG/4ML IX SOSY
88.0000 mg | PREFILLED_SYRINGE | INTRA_ARTICULAR | Status: AC | PRN
  Administered 2018-04-25: 88 mg via INTRA_ARTICULAR

## 2018-04-25 NOTE — Progress Notes (Signed)
   Procedure Note  Patient: Laura Walker             Date of Birth: January 30, 1930           MRN: [id number]             Visit Date: 04/23/2018  Procedures: Visit Diagnoses: Unilateral primary osteoarthritis, left knee  Unilateral primary osteoarthritis, right knee  Large Joint Inj: bilateral knee on 04/25/2018 9:53 PM Indications: diagnostic evaluation, joint swelling and pain Details: 18 G 1.5 in needle, superolateral approach  Arthrogram: No  Medications (Right): 5 mL lidocaine 1 %; 88 mg Hyaluronan 88 MG/4ML Medications (Left): 5 mL lidocaine 1 %; 88 mg Hyaluronan 88 MG/4ML Outcome: tolerated well, no immediate complications Procedure, treatment alternatives, risks and benefits explained, specific risks discussed. Consent was given by the patient. Immediately prior to procedure a time out was called to verify the correct patient, procedure, equipment, support staff and site/side marked as required. Patient was prepped and draped in the usual sterile fashion.

## 2018-06-26 DIAGNOSIS — B351 Tinea unguium: Secondary | ICD-10-CM | POA: Diagnosis not present

## 2018-06-26 DIAGNOSIS — R159 Full incontinence of feces: Secondary | ICD-10-CM | POA: Diagnosis not present

## 2018-06-26 DIAGNOSIS — F0390 Unspecified dementia without behavioral disturbance: Secondary | ICD-10-CM | POA: Diagnosis not present

## 2018-06-26 DIAGNOSIS — F322 Major depressive disorder, single episode, severe without psychotic features: Secondary | ICD-10-CM | POA: Diagnosis not present

## 2018-06-26 DIAGNOSIS — R7303 Prediabetes: Secondary | ICD-10-CM | POA: Diagnosis not present

## 2018-06-26 DIAGNOSIS — N183 Chronic kidney disease, stage 3 (moderate): Secondary | ICD-10-CM | POA: Diagnosis not present

## 2018-06-26 DIAGNOSIS — E559 Vitamin D deficiency, unspecified: Secondary | ICD-10-CM | POA: Diagnosis not present

## 2018-06-26 DIAGNOSIS — L219 Seborrheic dermatitis, unspecified: Secondary | ICD-10-CM | POA: Diagnosis not present

## 2018-06-26 DIAGNOSIS — N2581 Secondary hyperparathyroidism of renal origin: Secondary | ICD-10-CM | POA: Diagnosis not present

## 2018-06-26 DIAGNOSIS — G4733 Obstructive sleep apnea (adult) (pediatric): Secondary | ICD-10-CM | POA: Diagnosis not present

## 2018-06-26 DIAGNOSIS — M129 Arthropathy, unspecified: Secondary | ICD-10-CM | POA: Diagnosis not present

## 2018-06-26 DIAGNOSIS — E785 Hyperlipidemia, unspecified: Secondary | ICD-10-CM | POA: Diagnosis not present

## 2018-07-29 ENCOUNTER — Encounter (INDEPENDENT_AMBULATORY_CARE_PROVIDER_SITE_OTHER): Payer: Self-pay | Admitting: Orthopedic Surgery

## 2018-07-29 ENCOUNTER — Ambulatory Visit (INDEPENDENT_AMBULATORY_CARE_PROVIDER_SITE_OTHER): Payer: PPO | Admitting: Orthopedic Surgery

## 2018-07-29 DIAGNOSIS — M1711 Unilateral primary osteoarthritis, right knee: Secondary | ICD-10-CM | POA: Diagnosis not present

## 2018-07-29 DIAGNOSIS — M1712 Unilateral primary osteoarthritis, left knee: Secondary | ICD-10-CM | POA: Diagnosis not present

## 2018-07-29 MED ORDER — LIDOCAINE HCL 1 % IJ SOLN
5.0000 mL | INTRAMUSCULAR | Status: AC | PRN
  Administered 2018-07-29: 5 mL

## 2018-07-29 MED ORDER — BUPIVACAINE HCL 0.25 % IJ SOLN
4.0000 mL | INTRAMUSCULAR | Status: AC | PRN
  Administered 2018-07-29: 4 mL via INTRA_ARTICULAR

## 2018-07-29 MED ORDER — METHYLPREDNISOLONE ACETATE 40 MG/ML IJ SUSP
40.0000 mg | INTRAMUSCULAR | Status: AC | PRN
Start: 2018-07-29 — End: 2018-07-29
  Administered 2018-07-29: 40 mg via INTRA_ARTICULAR

## 2018-07-29 MED ORDER — BUPIVACAINE HCL 0.25 % IJ SOLN
4.0000 mL | INTRAMUSCULAR | Status: AC | PRN
Start: 2018-07-29 — End: 2018-07-29
  Administered 2018-07-29: 4 mL via INTRA_ARTICULAR

## 2018-07-29 MED ORDER — METHYLPREDNISOLONE ACETATE 40 MG/ML IJ SUSP
40.0000 mg | INTRAMUSCULAR | Status: AC | PRN
  Administered 2018-07-29: 40 mg via INTRA_ARTICULAR

## 2018-07-29 NOTE — Progress Notes (Signed)
Office Visit Note   Patient: Laura Walker           Date of Birth: 09-24-1930           MRN: [id number] Visit Date: 07/29/2018 Requested by: [name], MD [facility], [place] [address] PCP: [name], MD  Subjective: Chief Complaint  Patient presents with  . Right Knee - Pain  . Left Knee - Pain    HPI: Laura Walker is a patient with known bilateral knee arthritis.  She had gel injection in May 2019 without much relief.  Cortisone injections have done better for her.  She is here with her daughter.  She does use assistive devices to walk.  Denies any interval history of injury.              ROS: All systems reviewed are negative as they relate to the chief complaint within the history of present illness.  Patient denies  fevers or chills.   Assessment & Plan: Visit Diagnoses:  1. Unilateral primary osteoarthritis, left knee   2. Unilateral primary osteoarthritis, right knee     Plan: Impression is bilateral knee arthritis.  Plan is bilateral knee cortisone injections today.  Left knee is aspirated of about 30 cc.  Both knees injected.  We will plan to see her back in mid December for another injection.  Follow-Up Instructions: Return in about 14 weeks (around 11/04/2018).   Orders:  No orders of the defined types were placed in this encounter.  No orders of the defined types were placed in this encounter.     Procedures: Large Joint Inj: bilateral knee on 07/29/2018 10:03 PM Indications: diagnostic evaluation, joint swelling and pain Details: 18 G 1.5 in needle, superolateral approach  Arthrogram: No  Medications (Right): 5 mL lidocaine 1 %; 40 mg methylPREDNISolone acetate 40 MG/ML; 4 mL bupivacaine 0.25 % Medications (Left): 5 mL lidocaine 1 %; 40 mg methylPREDNISolone acetate 40 MG/ML; 4 mL bupivacaine 0.25 % Outcome: tolerated well, no immediate complications Procedure, treatment alternatives, risks and benefits explained,  specific risks discussed. Consent was given by the patient. Immediately prior to procedure a time out was called to verify the correct patient, procedure, equipment, support staff and site/side marked as required. Patient was prepped and draped in the usual sterile fashion.       Clinical Data: No additional findings.  Objective: Vital Signs: There were no vitals taken for this visit.  Physical Exam:   Constitutional: Patient appears well-developed HEENT:  Head: Normocephalic Eyes:EOM are normal Neck: Normal range of motion Cardiovascular: Normal rate Pulmonary/chest: Effort normal Neurologic: Patient is alert Skin: Skin is warm Psychiatric: Patient has normal mood and affect    Ortho Exam: Ortho exam demonstrates full active and passive range of motion of the hips and knees.  Pedal pulses intact.  Extensor mechanism is intact.  Only slight flexion contractures present.  Significant bilateral patellofemoral crepitus is present.  Effusion present on the left knee.  Specialty Comments:  No specialty comments available.  Imaging: No results found.   PMFS History: There are no active problems to display for this patient.  Past Medical History:  Diagnosis Date  . CKD (chronic kidney disease)   . Hypercholesteremia   . Memory loss   . OSA (obstructive sleep apnea)    CPAP  . Osteopenia     Family History  Problem Relation Age of Onset  . Cancer - Lung Mother   . Heart disease Father   .  Atrial fibrillation Sister     Past Surgical History:  Procedure Laterality Date  . ANKLE SURGERY Left    as child, fracture  . BUNIONECTOMY     Social History   Occupational History    Comment: retired Furniture conservator/restorer  Tobacco Use  . Smoking status: Former Smoker    Last attempt to quit: 12/13/1984    Years since quitting: 33.6  . Smokeless tobacco: Never Used  Substance and Sexual Activity  . Alcohol use: Yes    Comment: occas wine  . Drug use: No  . Sexual  activity: Not on file

## 2018-08-03 DIAGNOSIS — G4733 Obstructive sleep apnea (adult) (pediatric): Secondary | ICD-10-CM | POA: Diagnosis not present

## 2018-08-06 ENCOUNTER — Other Ambulatory Visit: Payer: Self-pay | Admitting: Family Medicine

## 2018-08-06 DIAGNOSIS — Z1231 Encounter for screening mammogram for malignant neoplasm of breast: Secondary | ICD-10-CM

## 2018-08-13 DIAGNOSIS — G4733 Obstructive sleep apnea (adult) (pediatric): Secondary | ICD-10-CM | POA: Diagnosis not present

## 2018-08-24 ENCOUNTER — Encounter: Payer: Self-pay | Admitting: Diagnostic Neuroimaging

## 2018-08-24 ENCOUNTER — Encounter

## 2018-08-24 ENCOUNTER — Ambulatory Visit (INDEPENDENT_AMBULATORY_CARE_PROVIDER_SITE_OTHER): Payer: PPO | Admitting: Diagnostic Neuroimaging

## 2018-08-24 VITALS — BP 130/80 | HR 64 | Ht 63.0 in | Wt 215.0 lb

## 2018-08-24 DIAGNOSIS — F0390 Unspecified dementia without behavioral disturbance: Secondary | ICD-10-CM

## 2018-08-24 DIAGNOSIS — F03A0 Unspecified dementia, mild, without behavioral disturbance, psychotic disturbance, mood disturbance, and anxiety: Secondary | ICD-10-CM

## 2018-08-24 MED ORDER — MEMANTINE HCL 10 MG PO TABS: 10.0000 mg | ORAL_TABLET | Freq: Two times a day (BID) | ORAL | 12 refills | Status: DC

## 2018-08-24 MED ORDER — DONEPEZIL HCL 5 MG PO TABS: 5.0000 mg | ORAL_TABLET | Freq: Every day | ORAL | 0 refills | Status: DC

## 2018-08-24 MED ORDER — DONEPEZIL HCL 10 MG PO TABS: 10.0000 mg | ORAL_TABLET | Freq: Every day | ORAL | 12 refills | Status: DC

## 2018-08-24 NOTE — Progress Notes (Signed)
[facility]  PATIENT: Laura Walker DOB: 05/13/1930  REFERRING CLINICIAN: S Wolters HISTORY FROM: patient and daughter REASON FOR VISIT: follow up    HISTORICAL  CHIEF COMPLAINT:  Chief Complaint  Patient presents with  . Memory Loss    rm 7, dgtr- [place]  MMSE 22  . Follow-up    one year    HISTORY OF PRESENT ILLNESS:   UPDATE (08/24/18, VRP): Since last visit, symptoms are progressing. Severity is mild to moderate. No alleviating or aggravating factors. Tolerating memantine.    UPDATE 08/18/17: Since last visit, memory loss is stable. She has excellent support from her 2 daughters. Slightly decr insight. She had 1 fall recently, while trying to get up from chair, then right knee gave out slightly. Her daughter was able to slow her fall. No major injuries. Patient using cane most of the time, but not the walker that much.   UPDATE 04/15/17: Since last visit, symptoms are stable. Patient here alone for this visit. No new issues or concerns.   UPDATE 01/08/17: 84 year old female here for evaluation of dementia. Since last visit, patient has had some progression of memory loss over last 5-6 years. This is noted by daughters and son. Still driving short distances. Family slightly concerned about safety, but no accidents recently. Able to do most of her ADLs, but some reminders about hygiene and dressing. Daughter takes care of bills and finances. Patient's other daughter who lives in [place], was visiting a few months ago and concerned about patient's memory problems and dementia symptoms.  PRIOR HPI 10/21/11 (VRP): 84 year old right-handed female with history of hyperlipidemia, seasonal allergies, arthritis, here for evaluation of memory loss.  Patient was previously living independently in [place], [place], then 2 yrs ago moved in with her daughter here in [place].  Around the same time, she and her daughter have noted mild, intermittent short-term memory loss. For  example she has difficulty remembering appointment dates, times, names of actors.  She is still able to maintain her activities of daily living.   REVIEW OF SYSTEMS: Full 14 system review of systems performed and negative with exception of: memory loss.     ALLERGIES: Allergies  Allergen Reactions  . Tramadol Nausea Only    HOME MEDICATIONS: Outpatient Medications Prior to Visit  Medication Sig Dispense Refill  . acetaminophen (TYLENOL) 325 MG tablet Take 650 mg by mouth.    . cholecalciferol (VITAMIN D) 1000 units tablet Take 5,000 Units by mouth daily.    . ciclopirox (PENLAC) 8 % solution APPLY 1 DROP TOPICALLY TO AFFECTED AREA ONCE DAILY  4  . Cyanocobalamin (VITAMIN B-12) 5000 MCG LOZG Take 2,500 mcg by mouth.    . DULoxetine (CYMBALTA) 60 MG capsule Take 60 mg by mouth daily.  4  . meloxicam (MOBIC) 7.5 MG tablet TAKE 1 TO 2 TABLETS BY MOUTH ONCE DAILY AS NEEDED  4  . memantine (NAMENDA) 10 MG tablet Take 1 tablet (10 mg total) by mouth 2 (two) times daily. 60 tablet 12  . Omega-3 Fatty Acids (FISH OIL ADULT GUMMIES PO) Take by mouth. 1400/900 mg    . Red Yeast Rice Extract (RED YEAST RICE PO) Take by mouth.    . DULoxetine (CYMBALTA) 30 MG capsule 30 mg daily.    Marland Kitchen acetaminophen-codeine (TYLENOL #3) 300-30 MG tablet Take by mouth 2 (two) times daily as needed for moderate pain.     No facility-administered medications prior to visit.     PAST MEDICAL HISTORY: Past  Medical History:  Diagnosis Date  . CKD (chronic kidney disease)   . Hypercholesteremia   . Memory loss   . OSA (obstructive sleep apnea)    CPAP  . Osteopenia     PAST SURGICAL HISTORY: Past Surgical History:  Procedure Laterality Date  . ANKLE SURGERY Left    as child, fracture  . BUNIONECTOMY      FAMILY HISTORY: Family History  Problem Relation Age of Onset  . Cancer - Lung Mother   . Heart disease Father   . Atrial fibrillation Sister     SOCIAL HISTORY:  Social History    Socioeconomic History  . Marital status: Widowed    Spouse name: Not on file  . Number of children: 5  . Years of education: 51  . Highest education level: Not on file  Occupational History    Comment: retired Furniture conservator/restorer  Social Needs  . Financial resource strain: Not on file  . Food insecurity:    Worry: Not on file    Inability: Not on file  . Transportation needs:    Medical: Not on file    Non-medical: Not on file  Tobacco Use  . Smoking status: Former Smoker    Last attempt to quit: 12/13/1984    Years since quitting: 33.7  . Smokeless tobacco: Never Used  Substance and Sexual Activity  . Alcohol use: Yes    Comment: occas wine  . Drug use: No  . Sexual activity: Not on file  Lifestyle  . Physical activity:    Days per week: Not on file    Minutes per session: Not on file  . Stress: Not on file  Relationships  . Social connections:    Talks on phone: Not on file    Gets together: Not on file    Attends religious service: Not on file    Active member of club or organization: Not on file    Attends meetings of clubs or organizations: Not on file    Relationship status: Not on file  . Intimate partner violence:    Fear of current or ex partner: Not on file    Emotionally abused: Not on file    Physically abused: Not on file    Forced sexual activity: Not on file  Other Topics Concern  . Not on file  Social History Narrative   Lives with daughter   Caffeine- coffee 2 cups, occas tea     PHYSICAL EXAM  GENERAL EXAM/CONSTITUTIONAL: Vitals:  Vitals:   08/24/18 1552  BP: 130/80  Pulse: 64  Weight: 215 lb (97.5 kg)  Height: 5\' 3"  (1.6 m)   Body mass index is 38.09 kg/m. No exam data present  Patient is in no distress; well developed, nourished and groomed; neck is supple  CARDIOVASCULAR:  Examination of carotid arteries is normal; no carotid bruits  Regular rate and rhythm, no murmurs  Examination of peripheral vascular system by  observation and palpation is normal  EYES:  Ophthalmoscopic exam of optic discs and posterior segments is normal; no papilledema or hemorrhages  MUSCULOSKELETAL:  Gait, strength, tone, movements noted in Neurologic exam below  NEUROLOGIC: MENTAL STATUS:  MMSE - [place] Exam 08/24/2018 08/18/2017 04/15/2017  Orientation to time 1 5 5   Orientation to Place 5 5 5   Registration 3 3 3   Attention/ Calculation 2 2 3   Recall 2 2 2   Language- name 2 objects 2 2 2   Language- repeat 1 1 1   Language-  follow 3 step command 3 3 3   Language- read & follow direction 1 1 1   Write a sentence 1 1 1   Copy design 1 1 1   Total score 22 26 27     awake, alert, oriented to person  [place]idden attention and concentration  language fluent, comprehension intact, naming intact,   fund of knowledge appropriate  CRANIAL NERVE:   2nd - no papilledema on fundoscopic exam  2nd, 3rd, 4th, 6th - pupils equal and reactive to light, visual fields full to confrontation, extraocular muscles intact, no nystagmus  5th - facial sensation symmetric  7th - facial strength symmetric  8th - hearing intact  9th - palate elevates symmetrically, uvula midline  11th - shoulder shrug symmetric  12th - tongue protrusion midline  MOTOR:   normal bulk and tone, full strength in the BUE, BLE  SENSORY:   normal and symmetric to light touch, temperature  DEC VIB AT HANDS  ABSENT VIB AT TOES  COORDINATION:   finger-nose-finger, fine finger movements normal  REFLEXES:   deep tendon reflexes TRACE and symmetric  GAIT/STATION:   narrow based gait    DIAGNOSTIC DATA (LABS, IMAGING, TESTING) - I reviewed patient records, labs, notes, testing and imaging myself where available.  No results found for: WBC, HGB, HCT, MCV, PLT No results found for: NA, K, CL, CO2, GLUCOSE, BUN, CREATININE, CALCIUM, PROT, ALBUMIN, AST, ALT, ALKPHOS, BILITOT, GFRNONAA, GFRAA No results found for: CHOL,  HDL, LDLCALC, LDLDIRECT, TRIG, CHOLHDL No results found for: HGBA1C No results found for: VITAMINB12 No results found for: TSH   11/07/11  MRI brain (without contrast) demonstrating: 1. Mild perisylvian and mesial temporal atrophy.   2. Moderate chronic small vessel ischemic disease.      ASSESSMENT AND PLAN  84 y.o. year old female here with progressive mild memory loss and cognitive decline since 2010. Patient having some decline in her ADLs. Patient living with daughter in a good supervised and safe environment. Had long conversation regarding diagnosis, prognosis, treatment options.   Dx: mild dementia  1. Mild dementia ([place])      PLAN:  MILD DEMENTIA (worsening) - continue memantine 10mg  twice a day  - start donepezil 5mg  daily; then increase to 10mg  daily after 2 weeks - safety and supervision issues reviewed; use walker - no driving  HYPERTENSION - follow up BP with PCP  Meds ordered this encounter  Medications  . memantine (NAMENDA) 10 MG tablet    Sig: Take 1 tablet (10 mg total) by mouth 2 (two) times daily.    Dispense:  60 tablet    Refill:  12  . donepezil (ARICEPT) 5 MG tablet    Sig: Take 1 tablet (5 mg total) by mouth at bedtime.    Dispense:  30 tablet    Refill:  0  . donepezil (ARICEPT) 10 MG tablet    Sig: Take 1 tablet (10 mg total) by mouth at bedtime.    Dispense:  30 tablet    Refill:  12   Return if symptoms worsen or fail to improve, for return to PCP.    Penni Bombard, MD 7/94/8016, 5:53 PM Certified in Neurology, Neurophysiology and Neuroimaging  [facility] [address] [place], [place] [address] [phone]

## 2018-08-24 NOTE — Patient Instructions (Signed)
-   continue memantine 10mg  twice a day   - start donepezil 5mg  daily; then increase to 10mg  daily after 2 weeks

## 2018-09-02 DIAGNOSIS — G4733 Obstructive sleep apnea (adult) (pediatric): Secondary | ICD-10-CM | POA: Diagnosis not present

## 2018-09-08 DIAGNOSIS — G4733 Obstructive sleep apnea (adult) (pediatric): Secondary | ICD-10-CM | POA: Diagnosis not present

## 2018-09-08 DIAGNOSIS — N183 Chronic kidney disease, stage 3 (moderate): Secondary | ICD-10-CM | POA: Diagnosis not present

## 2018-09-08 DIAGNOSIS — F322 Major depressive disorder, single episode, severe without psychotic features: Secondary | ICD-10-CM | POA: Diagnosis not present

## 2018-09-08 DIAGNOSIS — M1990 Unspecified osteoarthritis, unspecified site: Secondary | ICD-10-CM | POA: Diagnosis not present

## 2018-09-08 DIAGNOSIS — F0390 Unspecified dementia without behavioral disturbance: Secondary | ICD-10-CM | POA: Diagnosis not present

## 2018-09-08 DIAGNOSIS — E559 Vitamin D deficiency, unspecified: Secondary | ICD-10-CM | POA: Diagnosis not present

## 2018-09-08 DIAGNOSIS — N2581 Secondary hyperparathyroidism of renal origin: Secondary | ICD-10-CM | POA: Diagnosis not present

## 2018-09-08 DIAGNOSIS — R7303 Prediabetes: Secondary | ICD-10-CM | POA: Diagnosis not present

## 2018-09-08 DIAGNOSIS — I129 Hypertensive chronic kidney disease with stage 1 through stage 4 chronic kidney disease, or unspecified chronic kidney disease: Secondary | ICD-10-CM | POA: Diagnosis not present

## 2018-09-08 DIAGNOSIS — E785 Hyperlipidemia, unspecified: Secondary | ICD-10-CM | POA: Diagnosis not present

## 2018-09-08 DIAGNOSIS — Z79899 Other long term (current) drug therapy: Secondary | ICD-10-CM | POA: Diagnosis not present

## 2018-09-08 DIAGNOSIS — Z0000 Encounter for general adult medical examination without abnormal findings: Secondary | ICD-10-CM | POA: Diagnosis not present

## 2018-09-14 ENCOUNTER — Ambulatory Visit: Payer: Self-pay

## 2018-09-30 ENCOUNTER — Ambulatory Visit
Admission: RE | Admit: 2018-09-30 | Discharge: 2018-09-30 | Disposition: A | Discharge status: Home / Self Care | Payer: PPO | Source: Ambulatory Visit | Attending: Family Medicine | Admitting: Family Medicine

## 2018-09-30 DIAGNOSIS — Z1231 Encounter for screening mammogram for malignant neoplasm of breast: Secondary | ICD-10-CM

## 2018-10-03 DIAGNOSIS — G4733 Obstructive sleep apnea (adult) (pediatric): Secondary | ICD-10-CM | POA: Diagnosis not present

## 2018-11-02 DIAGNOSIS — G4733 Obstructive sleep apnea (adult) (pediatric): Secondary | ICD-10-CM | POA: Diagnosis not present

## 2018-11-09 ENCOUNTER — Encounter (INDEPENDENT_AMBULATORY_CARE_PROVIDER_SITE_OTHER): Payer: Self-pay | Admitting: Orthopedic Surgery

## 2018-11-09 ENCOUNTER — Ambulatory Visit (INDEPENDENT_AMBULATORY_CARE_PROVIDER_SITE_OTHER): Payer: PPO | Admitting: Orthopedic Surgery

## 2018-11-09 DIAGNOSIS — M1711 Unilateral primary osteoarthritis, right knee: Secondary | ICD-10-CM | POA: Diagnosis not present

## 2018-11-09 DIAGNOSIS — M1712 Unilateral primary osteoarthritis, left knee: Secondary | ICD-10-CM

## 2018-11-11 ENCOUNTER — Encounter (INDEPENDENT_AMBULATORY_CARE_PROVIDER_SITE_OTHER): Payer: Self-pay | Admitting: Orthopedic Surgery

## 2018-11-11 DIAGNOSIS — M1711 Unilateral primary osteoarthritis, right knee: Secondary | ICD-10-CM | POA: Diagnosis not present

## 2018-11-11 DIAGNOSIS — M1712 Unilateral primary osteoarthritis, left knee: Secondary | ICD-10-CM | POA: Diagnosis not present

## 2018-11-11 MED ORDER — METHYLPREDNISOLONE ACETATE 80 MG/ML IJ SUSP
80.0000 mg | INTRAMUSCULAR | Status: AC | PRN
  Administered 2018-11-11: 80 mg via INTRA_ARTICULAR

## 2018-11-11 MED ORDER — BUPIVACAINE HCL 0.25 % IJ SOLN
4.0000 mL | INTRAMUSCULAR | Status: AC | PRN
  Administered 2018-11-11: 4 mL via INTRA_ARTICULAR

## 2018-11-11 MED ORDER — LIDOCAINE HCL 1 % IJ SOLN
5.0000 mL | INTRAMUSCULAR | Status: AC | PRN
  Administered 2018-11-11: 5 mL

## 2018-11-11 NOTE — Progress Notes (Signed)
   Procedure Note  Patient: Laura Walker             Date of Birth: 1930-05-12           MRN: [id number]             Visit Date: 11/09/2018  Procedures: Visit Diagnoses: Unilateral primary osteoarthritis, left knee  Unilateral primary osteoarthritis, right knee  Large Joint Inj: bilateral knee on 11/11/2018 12:27 PM Indications: diagnostic evaluation, joint swelling and pain Details: 18 G 1.5 in needle, superolateral approach  Arthrogram: No  Medications (Right): 5 mL lidocaine 1 %; 4 mL bupivacaine 0.25 %; 80 mg methylPREDNISolone acetate 80 MG/ML Medications (Left): 5 mL lidocaine 1 %; 4 mL bupivacaine 0.25 %; 80 mg methylPREDNISolone acetate 80 MG/ML Outcome: tolerated well, no immediate complications Procedure, treatment alternatives, risks and benefits explained, specific risks discussed. Consent was given by the patient. Immediately prior to procedure a time out was called to verify the correct patient, procedure, equipment, support staff and site/side marked as required. Patient was prepped and draped in the usual sterile fashion.

## 2018-11-16 DIAGNOSIS — G4733 Obstructive sleep apnea (adult) (pediatric): Secondary | ICD-10-CM | POA: Diagnosis not present

## 2018-11-23 DIAGNOSIS — G4733 Obstructive sleep apnea (adult) (pediatric): Secondary | ICD-10-CM | POA: Diagnosis not present

## 2018-12-03 DIAGNOSIS — G4733 Obstructive sleep apnea (adult) (pediatric): Secondary | ICD-10-CM | POA: Diagnosis not present

## 2019-01-03 DIAGNOSIS — G4733 Obstructive sleep apnea (adult) (pediatric): Secondary | ICD-10-CM | POA: Diagnosis not present

## 2019-02-01 DIAGNOSIS — G4733 Obstructive sleep apnea (adult) (pediatric): Secondary | ICD-10-CM | POA: Diagnosis not present

## 2019-02-08 ENCOUNTER — Ambulatory Visit (INDEPENDENT_AMBULATORY_CARE_PROVIDER_SITE_OTHER): Payer: PPO | Admitting: Orthopedic Surgery

## 2019-02-16 ENCOUNTER — Telehealth (INDEPENDENT_AMBULATORY_CARE_PROVIDER_SITE_OTHER): Payer: Self-pay | Admitting: *Deleted

## 2019-02-16 NOTE — Telephone Encounter (Signed)
Called pt and asked COVID-19 Pre-Screening Questions and they answered no to all questions listed.

## 2019-02-17 ENCOUNTER — Other Ambulatory Visit: Payer: Self-pay

## 2019-02-17 ENCOUNTER — Encounter (INDEPENDENT_AMBULATORY_CARE_PROVIDER_SITE_OTHER): Payer: Self-pay | Admitting: Orthopedic Surgery

## 2019-02-17 ENCOUNTER — Ambulatory Visit (INDEPENDENT_AMBULATORY_CARE_PROVIDER_SITE_OTHER): Payer: PPO | Admitting: Orthopedic Surgery

## 2019-02-17 DIAGNOSIS — M170 Bilateral primary osteoarthritis of knee: Secondary | ICD-10-CM | POA: Diagnosis not present

## 2019-02-17 MED ORDER — METHYLPREDNISOLONE ACETATE 40 MG/ML IJ SUSP
40.0000 mg | INTRAMUSCULAR | Status: AC | PRN
  Administered 2019-02-17: 40 mg via INTRA_ARTICULAR

## 2019-02-17 MED ORDER — BUPIVACAINE HCL 0.25 % IJ SOLN
4.0000 mL | INTRAMUSCULAR | Status: AC | PRN
  Administered 2019-02-17: 4 mL via INTRA_ARTICULAR

## 2019-02-17 MED ORDER — LIDOCAINE HCL 1 % IJ SOLN
5.0000 mL | INTRAMUSCULAR | Status: AC | PRN
  Administered 2019-02-17: 5 mL

## 2019-02-17 NOTE — Progress Notes (Signed)
Office Visit Note   Patient: Laura Walker           Date of Birth: [birth year]           MRN: [id number] Visit Date: 02/17/2019 Requested by: [name], MD [address], [place] [address] PCP: [name], MD  Subjective: Chief Complaint  Patient presents with  . Knee Pain    HPI: Laura Walker is a patient who lives at home with her daughter.  She reports bilateral knee pain.  She would like to get knee injections today if possible.  She would like to discuss that.  She is considering knee replacement surgery but we discussed that as well as the inherent risk and stress on the body.  I think she is going to postpone that for now.  She states that topical does help and she is able to get around her house.              ROS: All systems reviewed are negative as they relate to the chief complaint within the history of present illness.  Patient denies  fevers or chills.   Assessment & Plan: Visit Diagnoses:  1. Bilateral primary osteoarthritis of knee     Plan: Impression is bilateral knee pain and arthritis.  Plan is bilateral cortisone injections today.  Gel injections do not help much.  I will see her back in about 4 months and will reassess.  Continue with range of motion and strengthening exercises as much as possible.  Follow-Up Instructions: Return in about 4 months (around 06/19/2019).   Orders:  No orders of the defined types were placed in this encounter.  No orders of the defined types were placed in this encounter.     Procedures: Large Joint Inj: bilateral knee on 02/17/2019 1:09 PM Indications: diagnostic evaluation, joint swelling and pain Details: 18 G 1.5 in needle, superolateral approach  Arthrogram: No  Medications (Right): 5 mL lidocaine 1 %; 40 mg methylPREDNISolone acetate 40 MG/ML; 4 mL bupivacaine 0.25 % Medications (Left): 5 mL lidocaine 1 %; 40 mg methylPREDNISolone acetate 40 MG/ML; 4 mL bupivacaine 0.25 % Outcome:  tolerated well, no immediate complications Procedure, treatment alternatives, risks and benefits explained, specific risks discussed. Consent was given by the patient. Immediately prior to procedure a time out was called to verify the correct patient, procedure, equipment, support staff and site/side marked as required. Patient was prepped and draped in the usual sterile fashion.       Clinical Data: No additional findings.  Objective: Vital Signs: There were no vitals taken for this visit.  Physical Exam:   Constitutional: Patient appears well-developed HEENT:  Head: Normocephalic Eyes:EOM are normal Neck: Normal range of motion Cardiovascular: Normal rate Pulmonary/chest: Effort normal Neurologic: Patient is alert Skin: Skin is warm Psychiatric: Patient has normal mood and affect    Ortho Exam: Ortho exam demonstrates full active and passive range of motion of the hips and ankles.  She has a slight valgus alignment bilateral lower extremities with palpable pedal pulses and not much in way of pitting edema.  She has good range of motion in both knees with only about a 5 degree flexion contracture and flexion past 90 degrees easily.  Extensor mechanism is intact bilaterally  Specialty Comments:  No specialty comments available.  Imaging: No results found.   PMFS History: Patient Active Problem List   Diagnosis Date Noted  . Mild dementia ([place]) 08/24/2018   Past Medical History:  Diagnosis  Date  . CKD (chronic kidney disease)   . Hypercholesteremia   . Memory loss   . OSA (obstructive sleep apnea)    CPAP  . Osteopenia     Family History  Problem Relation Age of Onset  . Cancer - Lung Mother   . Heart disease Father   . Atrial fibrillation Sister     Past Surgical History:  Procedure Laterality Date  . ANKLE SURGERY Left    as child, fracture  . BUNIONECTOMY     Social History   Occupational History    Comment: retired Furniture conservator/restorer  Tobacco Use   . Smoking status: Former Smoker    Last attempt to quit: 12/13/1984    Years since quitting: 34.2  . Smokeless tobacco: Never Used  Substance and Sexual Activity  . Alcohol use: Yes    Comment: occas wine  . Drug use: No  . Sexual activity: Not on file

## 2019-03-04 DIAGNOSIS — E139 Other specified diabetes mellitus without complications: Secondary | ICD-10-CM | POA: Diagnosis not present

## 2019-03-04 DIAGNOSIS — G4733 Obstructive sleep apnea (adult) (pediatric): Secondary | ICD-10-CM | POA: Diagnosis not present

## 2019-04-03 DIAGNOSIS — G4733 Obstructive sleep apnea (adult) (pediatric): Secondary | ICD-10-CM | POA: Diagnosis not present

## 2019-08-11 DIAGNOSIS — Z23 Encounter for immunization: Secondary | ICD-10-CM | POA: Diagnosis not present

## 2019-09-06 ENCOUNTER — Other Ambulatory Visit: Payer: Self-pay | Admitting: Diagnostic Neuroimaging

## 2019-09-07 NOTE — Telephone Encounter (Signed)
Refilled donepezil, namenda x 1 month with note to pharmacy: future refills through PCP.

## 2019-09-08 ENCOUNTER — Other Ambulatory Visit: Payer: Self-pay | Admitting: Diagnostic Neuroimaging

## 2019-10-09 ENCOUNTER — Other Ambulatory Visit: Payer: Self-pay | Admitting: Diagnostic Neuroimaging

## 2019-10-20 ENCOUNTER — Telehealth: Payer: Self-pay | Admitting: Diagnostic Neuroimaging

## 2019-10-20 DIAGNOSIS — L219 Seborrheic dermatitis, unspecified: Secondary | ICD-10-CM | POA: Diagnosis not present

## 2019-10-20 DIAGNOSIS — R399 Unspecified symptoms and signs involving the genitourinary system: Secondary | ICD-10-CM | POA: Diagnosis not present

## 2019-10-20 DIAGNOSIS — F0390 Unspecified dementia without behavioral disturbance: Secondary | ICD-10-CM | POA: Diagnosis not present

## 2019-10-20 NOTE — Telephone Encounter (Signed)
Called daughter, Golden Circle and advised her of previous call today. She stated that she was the person who called PCP. PCP had refused to fill memory medications, was asking for last office note. I advised Tammi Sou fax note to PCP today. Libby  verbalized understanding, appreciation. Notes form Sept 2019 faxed to Dr Solon Palm.

## 2019-10-20 NOTE — Telephone Encounter (Signed)
Dr. [name] office called stating that the pt called telling them to order office notes and a prescription so that she can fill her prescriptions. They stated that the RN needs to call the daughter Golden Circle at [phone] to clear this up. Please advise.

## 2019-10-28 DIAGNOSIS — N2581 Secondary hyperparathyroidism of renal origin: Secondary | ICD-10-CM | POA: Diagnosis not present

## 2019-10-28 DIAGNOSIS — F322 Major depressive disorder, single episode, severe without psychotic features: Secondary | ICD-10-CM | POA: Diagnosis not present

## 2019-10-28 DIAGNOSIS — I10 Essential (primary) hypertension: Secondary | ICD-10-CM | POA: Diagnosis not present

## 2019-10-28 DIAGNOSIS — D692 Other nonthrombocytopenic purpura: Secondary | ICD-10-CM | POA: Diagnosis not present

## 2019-10-28 DIAGNOSIS — R32 Unspecified urinary incontinence: Secondary | ICD-10-CM | POA: Diagnosis not present

## 2019-10-28 DIAGNOSIS — N183 Chronic kidney disease, stage 3 unspecified: Secondary | ICD-10-CM | POA: Diagnosis not present

## 2019-10-28 DIAGNOSIS — F0390 Unspecified dementia without behavioral disturbance: Secondary | ICD-10-CM | POA: Diagnosis not present

## 2019-10-28 NOTE — Telephone Encounter (Signed)
Patient daughter called stating that she was informed by PCP office  that notes from sep 2019 have not been received and would like to know if notes could be re-faxed.   Patient daughter provided 2 fax numbers notes could be resent to:  FAX#1: [phone] FAX#2: [phone]  Please follow up

## 2019-11-02 ENCOUNTER — Telehealth: Payer: Self-pay | Admitting: *Deleted

## 2019-11-02 NOTE — Telephone Encounter (Signed)
Done I will update daughter.

## 2019-11-02 NOTE — Telephone Encounter (Signed)
Hilda Blades could you please assist with sending records and update the pt's daughter? Thanks!

## 2019-11-24 DIAGNOSIS — G4733 Obstructive sleep apnea (adult) (pediatric): Secondary | ICD-10-CM | POA: Diagnosis not present

## 2019-12-07 DIAGNOSIS — F0390 Unspecified dementia without behavioral disturbance: Secondary | ICD-10-CM | POA: Diagnosis not present

## 2019-12-07 DIAGNOSIS — R531 Weakness: Secondary | ICD-10-CM | POA: Diagnosis not present

## 2019-12-08 DIAGNOSIS — R531 Weakness: Secondary | ICD-10-CM | POA: Diagnosis not present

## 2019-12-22 ENCOUNTER — Ambulatory Visit (INDEPENDENT_AMBULATORY_CARE_PROVIDER_SITE_OTHER): Payer: PPO | Admitting: Otolaryngology

## 2019-12-22 ENCOUNTER — Other Ambulatory Visit: Payer: Self-pay

## 2019-12-22 VITALS — Temp 97.5°F

## 2019-12-22 DIAGNOSIS — H6123 Impacted cerumen, bilateral: Secondary | ICD-10-CM | POA: Diagnosis not present

## 2019-12-22 DIAGNOSIS — H60313 Diffuse otitis externa, bilateral: Secondary | ICD-10-CM | POA: Diagnosis not present

## 2019-12-22 NOTE — Progress Notes (Signed)
HPI: Laura Walker is a 86 y.o. female who presents for evaluation of cerumen buildup.  She is [facility] by [facility].  She has longstanding hearing loss and wears bilateral hearing aids.  She has had these worked on and they felt like wax was contributing to her hearing difficulty..  Past Medical History:  Diagnosis Date  . CKD (chronic kidney disease)   . Hypercholesteremia   . Memory loss   . OSA (obstructive sleep apnea)    CPAP  . Osteopenia    Past Surgical History:  Procedure Laterality Date  . ANKLE SURGERY Left    as child, fracture  . BUNIONECTOMY     Social History   Socioeconomic History  . Marital status: Widowed    Spouse name: Not on file  . Number of children: 5  . Years of education: 17  . Highest education level: Not on file  Occupational History    Comment: retired sub teacher/tutor  Tobacco Use  . Smoking status: Former Smoker    Quit date: 12/13/1984    Years since quitting: 35.0  . Smokeless tobacco: Never Used  Substance and Sexual Activity  . Alcohol use: Yes    Comment: occas wine  . Drug use: No  . Sexual activity: Not on file  Other Topics Concern  . Not on file  Social History Narrative   Lives with daughter   Caffeine- coffee 2 cups, occas tea   Social Determinants of Health   Financial Resource Strain:   . Difficulty of Paying Living Expenses: Not on file  Food Insecurity:   . Worried About Charity fundraiser in the Last Year: Not on file  . Ran Out of Food in the Last Year: Not on file  Transportation Needs:   . Lack of Transportation (Medical): Not on file  . Lack of Transportation (Non-Medical): Not on file  Physical Activity:   . Days of Exercise per Week: Not on file  . Minutes of Exercise per Session: Not on file  Stress:   . Feeling of Stress : Not on file  Social Connections:   . Frequency of Communication with Friends and Family: Not on file  . Frequency of Social Gatherings with Friends and Family: Not on  file  . Attends Religious Services: Not on file  . Active Member of Clubs or Organizations: Not on file  . Attends Archivist Meetings: Not on file  . Marital Status: Not on file   Family History  Problem Relation Age of Onset  . Cancer - Lung Mother   . Heart disease Father   . Atrial fibrillation Sister    Allergies  Allergen Reactions  . Tramadol Nausea Only   Prior to Admission medications   Medication Sig Start Date End Date Taking? Authorizing Provider  acetaminophen (TYLENOL) 325 MG tablet Take 650 mg by mouth.   Yes [provider]  acetaminophen-codeine (TYLENOL #3) 300-30 MG tablet Take by mouth 2 (two) times daily as needed for moderate pain.   Yes [provider]  cholecalciferol (VITAMIN D) 1000 units tablet Take 5,000 Units by mouth daily.   Yes [provider]  ciclopirox (PENLAC) 8 % solution APPLY 1 DROP TOPICALLY TO AFFECTED AREA ONCE DAILY 08/01/18  Yes [provider]  Cyanocobalamin (VITAMIN B-12) 5000 MCG LOZG Take 2,500 mcg by mouth.   Yes [provider]  donepezil (ARICEPT) 10 MG tablet TAKE 1 TABLET BY MOUTH AT BEDTIME 09/07/19  Yes Penumalli, Vikram  R, MD  DULoxetine (CYMBALTA) 60 MG capsule Take 60 mg by mouth daily. 08/04/18  Yes [provider]  meloxicam (MOBIC) 7.5 MG tablet TAKE 1 TO 2 TABLETS BY MOUTH ONCE DAILY AS NEEDED 08/01/18  Yes [provider]  memantine (NAMENDA) 10 MG tablet Take 1 tablet by mouth twice daily 09/07/19  Yes Penumalli, Earlean Polka, MD  Omega-3 Fatty Acids (FISH OIL ADULT GUMMIES PO) Take by mouth. 1400/900 mg   Yes [provider]  Red Yeast Rice Extract (RED YEAST RICE PO) Take by mouth.   Yes [provider]     Positive ROS: Otherwise negative  All other systems have been reviewed and were otherwise negative with the exception of those mentioned in the HPI and as above.  Physical Exam: Constitutional: Alert, well-appearing, no acute  distress Ears: External ears without lesions or tenderness. Ear canals are small bilaterally.  She has slight chronic inflammatory changes of both ear canals.  Both ear canals were cleaned with suction.  Can only visualize the posterior portion of the TM but the TMs appear clear.  Of note she has chronic eczema changes of the external ears and concha area bilaterally.. Nasal: External nose without lesions. Clear nasal passages Oral: Oropharynx clear. Neck: No palpable adenopathy or masses Respiratory: Breathing comfortably  Skin: No facial/neck lesions or rash noted.  Cerumen impaction removal  Date/Time: 12/22/2019 12:45 PM Performed by: Rozetta Nunnery, MD Authorized by: Rozetta Nunnery, MD   Consent:    Consent obtained:  Verbal   Consent given by:  Patient   Risks discussed:  Pain and bleeding Procedure details:    Location:  L ear and R ear   Procedure type: curette and suction   Post-procedure details:    Inspection:  TM intact   Hearing quality:  Improved   Patient tolerance of procedure:  Tolerated well, no immediate complications Comments:     She has small ear canals bilaterally.  Both ear canals were cleaned with suction.  TMs appeared otherwise clear.    Assessment: Bilateral severe sensorineural hearing loss. Wax buildup External ear canal eczema and chronic ear canal otitis externa and stenosis.  Plan: Suggested trying Diprolene 0.05% cream to the external ears for the eczema and reviewed this with her caregiver. She will follow-up with [facility] otherwise.  Radene Journey, MD

## 2020-01-06 ENCOUNTER — Other Ambulatory Visit: Payer: Self-pay | Admitting: Oncology

## 2020-01-06 ENCOUNTER — Emergency Department (HOSPITAL_COMMUNITY): Payer: PPO

## 2020-01-06 ENCOUNTER — Other Ambulatory Visit: Payer: Self-pay

## 2020-01-06 ENCOUNTER — Ambulatory Visit: Payer: PPO | Admitting: Orthopedic Surgery

## 2020-01-06 ENCOUNTER — Emergency Department (HOSPITAL_COMMUNITY)
Admission: EM | Admit: 2020-01-06 | Discharge: 2020-01-06 | Disposition: A | Discharge status: Home / Self Care | Payer: PPO | Attending: Emergency Medicine | Admitting: Emergency Medicine

## 2020-01-06 ENCOUNTER — Encounter (HOSPITAL_COMMUNITY): Payer: Self-pay | Admitting: Emergency Medicine

## 2020-01-06 DIAGNOSIS — Z79899 Other long term (current) drug therapy: Secondary | ICD-10-CM | POA: Diagnosis not present

## 2020-01-06 DIAGNOSIS — R5381 Other malaise: Secondary | ICD-10-CM | POA: Diagnosis not present

## 2020-01-06 DIAGNOSIS — I4891 Unspecified atrial fibrillation: Secondary | ICD-10-CM | POA: Diagnosis not present

## 2020-01-06 DIAGNOSIS — R918 Other nonspecific abnormal finding of lung field: Secondary | ICD-10-CM | POA: Diagnosis not present

## 2020-01-06 DIAGNOSIS — F0390 Unspecified dementia without behavioral disturbance: Secondary | ICD-10-CM | POA: Diagnosis not present

## 2020-01-06 DIAGNOSIS — R4182 Altered mental status, unspecified: Secondary | ICD-10-CM | POA: Diagnosis not present

## 2020-01-06 DIAGNOSIS — Z20822 Contact with and (suspected) exposure to covid-19: Secondary | ICD-10-CM | POA: Diagnosis not present

## 2020-01-06 DIAGNOSIS — Z87891 Personal history of nicotine dependence: Secondary | ICD-10-CM | POA: Insufficient documentation

## 2020-01-06 DIAGNOSIS — R69 Illness, unspecified: Secondary | ICD-10-CM | POA: Diagnosis not present

## 2020-01-06 DIAGNOSIS — W19XXXA Unspecified fall, initial encounter: Secondary | ICD-10-CM | POA: Diagnosis not present

## 2020-01-06 LAB — COMPREHENSIVE METABOLIC PANEL
ALT: 15 U/L (ref 0–44)
AST: 17 U/L (ref 15–41)
Albumin: 3.6 g/dL (ref 3.5–5.0)
Alkaline Phosphatase: 74 U/L (ref 38–126)
Anion gap: 12 (ref 5–15)
BUN: 22 mg/dL (ref 8–23)
CO2: 26 mmol/L (ref 22–32)
Calcium: 9.7 mg/dL (ref 8.9–10.3)
Chloride: 105 mmol/L (ref 98–111)
Creatinine, Ser: 0.93 mg/dL (ref 0.44–1.00)
GFR calc Af Amer: >60 mL/min (ref >60)
GFR calc non Af Amer: 55 mL/min — ABNORMAL LOW (ref >60)
Glucose, Bld: 105 mg/dL — ABNORMAL HIGH (ref 70–99)
Potassium: 4.1 mmol/L (ref 3.5–5.1)
Sodium: 143 mmol/L (ref 135–145)
Total Bilirubin: 0.9 mg/dL (ref 0.3–1.2)
Total Protein: 6.5 g/dL (ref 6.5–8.1)

## 2020-01-06 LAB — CBC WITH DIFFERENTIAL/PLATELET
Abs Immature Granulocytes: 0.02 10*3/uL (ref 0.00–0.07)
Basophils Absolute: 0.1 10*3/uL (ref 0.0–0.1)
Basophils Relative: 1 %
Eosinophils Absolute: 0.1 10*3/uL (ref 0.0–0.5)
Eosinophils Relative: 1 %
HCT: 47 % — ABNORMAL HIGH (ref 36.0–46.0)
Hemoglobin: 14.3 g/dL (ref 12.0–15.0)
Immature Granulocytes: 0 %
Lymphocytes Relative: 17 %
Lymphs Abs: 1.7 10*3/uL (ref 0.7–4.0)
MCH: 29.1 pg (ref 26.0–34.0)
MCHC: 30.4 g/dL (ref 30.0–36.0)
MCV: 95.5 fL (ref 80.0–100.0)
Monocytes Absolute: 1.1 10*3/uL — ABNORMAL HIGH (ref 0.1–1.0)
Monocytes Relative: 11 %
Neutro Abs: 6.9 10*3/uL (ref 1.7–7.7)
Neutrophils Relative %: 70 %
Platelets: 264 10*3/uL (ref 150–400)
RBC: 4.92 MIL/uL (ref 3.87–5.11)
RDW: 13.9 % (ref 11.5–15.5)
WBC: 10 10*3/uL (ref 4.0–10.5)
nRBC: 0 % (ref 0.0–0.2)

## 2020-01-06 LAB — URINALYSIS, ROUTINE W REFLEX MICROSCOPIC
Bilirubin Urine: NEGATIVE
Glucose, UA: NEGATIVE mg/dL
Hgb urine dipstick: NEGATIVE
Ketones, ur: 5 mg/dL — AB
Leukocytes,Ua: NEGATIVE
Nitrite: NEGATIVE
Protein, ur: NEGATIVE mg/dL
Specific Gravity, Urine: 1.017 (ref 1.005–1.030)
pH: 8 (ref 5.0–8.0)

## 2020-01-06 LAB — RESPIRATORY PANEL BY RT PCR (FLU A&B, COVID)
Influenza A by PCR: NEGATIVE
Influenza B by PCR: NEGATIVE
SARS Coronavirus 2 by RT PCR: NEGATIVE

## 2020-01-06 LAB — TROPONIN I (HIGH SENSITIVITY)
Troponin I (High Sensitivity): 10 ng/L (ref <18)
Troponin I (High Sensitivity): 12 ng/L (ref <18)

## 2020-01-06 MED ORDER — SODIUM CHLORIDE 0.9 % IV BOLUS
500.0000 mL | Freq: Once | INTRAVENOUS | Status: AC
  Administered 2020-01-06: 500 mL via INTRAVENOUS

## 2020-01-06 MED ORDER — IOHEXOL 300 MG/ML  SOLN
75.0000 mL | Freq: Once | INTRAMUSCULAR | Status: AC | PRN
  Administered 2020-01-06: 75 mL via INTRAVENOUS

## 2020-01-06 NOTE — ED Notes (Signed)
Purewick is in place and hooked to suction canister.

## 2020-01-06 NOTE — ED Notes (Signed)
Bladder scan showed 15 ml.

## 2020-01-06 NOTE — ED Provider Notes (Signed)
Signout note  86 year old lady with weakness, fall.  Labs were normal limits, UA negative, Covid negative.  CXR concerning for right lung mass, CT head ordered due to possible head trauma, CT chest ordered to evaluate lung mass.  Received signout pending CTs  Reviewed CT lung findings in detail with patient and daughter at bedside, reviewed findings with oncology, Dr. Jana Hakim, he will forward to thoracic oncology team and office will reach out to schedule appointment. Patient is well appearing, has no acute complaints, O2 on RA is 99%. Additionally recommended following up with PCP.    Lucrezia Starch, MD 01/06/20 1743

## 2020-01-06 NOTE — ED Notes (Signed)
Pt's daughter stated pt wears CPAP at home, sats to mid-80's when falling asleep. 2LNC applied

## 2020-01-06 NOTE — ED Notes (Signed)
Pt SPO2 99% on RA

## 2020-01-06 NOTE — Discharge Instructions (Signed)
I have discussed with our oncologist on-call, he has notified the office and you should receive a phone call to set up an appointment with their specialist to discuss next steps regarding your lung mass.  If you have worsening difficulty in breathing, any episodes of passing out, additional falls or other new concerning symptom, recommend return to ER for reassessment.

## 2020-01-06 NOTE — ED Provider Notes (Signed)
[facility] EMERGENCY DEPARTMENT Provider Note   CSN: [id number] Arrival date & time: 01/06/20  1011     History No chief complaint on file.   Laura Walker is a 86 y.o. female.  86 yo female with PMH of dementia presents today for altered mental status and fall. Patient resides at home with the assistance of an aide. Mental status barring patient's participation so history is primarily from the aide, EMS. Per EMS report, aide noted that the patient has had foul smelling urine over the past few days and became more confused today compared to her baseline. Additionally, patient underwent an unwitnessed fall this morning while transitioning out of the bed. Unclear if she LOC or hit her head.She denies patient complaining of fever, chills, urinary changes, abdominal pain, n/v, diarrhea, cough, shortness of breath. No apparent PMH of CVA. Not on anticoagulation.   level 5 caveat    Past Medical History:  Diagnosis Date  . CKD (chronic kidney disease)   . Hypercholesteremia   . Memory loss   . OSA (obstructive sleep apnea)    CPAP  . Osteopenia     Patient Active Problem List   Diagnosis Date Noted  . Mild dementia ([place]) 08/24/2018    Past Surgical History:  Procedure Laterality Date  . ANKLE SURGERY Left    as child, fracture  . BUNIONECTOMY       OB History   No obstetric history on file.     Family History  Problem Relation Age of Onset  . Cancer - Lung Mother   . Heart disease Father   . Atrial fibrillation Sister     Social History   Tobacco Use  . Smoking status: Former Smoker    Quit date: 12/13/1984    Years since quitting: 35.0  . Smokeless tobacco: Never Used  Substance Use Topics  . Alcohol use: Yes    Comment: occas wine  . Drug use: No    Home Medications Prior to Admission medications   Medication Sig Start Date End Date Taking? Authorizing Provider  acetaminophen (TYLENOL) 325 MG tablet Take 650 mg by mouth.     [provider]  acetaminophen-codeine (TYLENOL #3) 300-30 MG tablet Take by mouth 2 (two) times daily as needed for moderate pain.    [provider]  cholecalciferol (VITAMIN D) 1000 units tablet Take 5,000 Units by mouth daily.    [provider]  ciclopirox (PENLAC) 8 % solution APPLY 1 DROP TOPICALLY TO AFFECTED AREA ONCE DAILY 08/01/18   [provider]  Cyanocobalamin (VITAMIN B-12) 5000 MCG LOZG Take 2,500 mcg by mouth.    [provider]  donepezil (ARICEPT) 10 MG tablet TAKE 1 TABLET BY MOUTH AT BEDTIME 09/07/19   Penumalli, Vikram R, MD  DULoxetine (CYMBALTA) 60 MG capsule Take 60 mg by mouth daily. 08/04/18   [provider]  meloxicam (MOBIC) 7.5 MG tablet TAKE 1 TO 2 TABLETS BY MOUTH ONCE DAILY AS NEEDED 08/01/18   [provider]  memantine (NAMENDA) 10 MG tablet Take 1 tablet by mouth twice daily 09/07/19   Penumalli, Earlean Polka, MD  Omega-3 Fatty Acids (FISH OIL ADULT GUMMIES PO) Take by mouth. 1400/900 mg    [provider]  Red Yeast Rice Extract (RED YEAST RICE PO) Take by mouth.    [provider]    Allergies    Tramadol  Review of Systems   Review of Systems  Unable to perform ROS: Dementia  LEVEL 5 CAVEAT  Physical Exam Updated Vital Signs BP (!) 129/99   Pulse 78   Temp 98.2 F (36.8 C) (Oral)   Resp 16   Wt 97.5 kg   SpO2 99%   BMI 38.08 kg/m   Physical Exam Constitutional:      General: She is not in acute distress. HENT:     Head: Atraumatic.  Eyes:     Extraocular Movements: Extraocular movements intact.     Pupils: Pupils are equal, round, and reactive to light.  Cardiovascular:     Rate and Rhythm: Regular rhythm. Tachycardia present.     Heart sounds: Normal heart sounds.  Pulmonary:     Effort: No respiratory distress.     Breath sounds: Normal breath sounds.  Abdominal:     General: There is no distension.     Tenderness: There is no abdominal tenderness.   Musculoskeletal:     Cervical back: Normal range of motion.     Right lower leg: Edema present.     Left lower leg: Edema present.  Skin:    General: Skin is warm and dry.     Capillary Refill: Capillary refill takes 2 to 3 seconds.  Neurological:     General: No focal deficit present.     Mental Status: She is alert.     Comments: ORIENTED TO PERSON AND PLACE  Psychiatric:        Mood and Affect: Mood normal.     ED Results / Procedures / Treatments   Labs (all labs ordered are listed, but only abnormal results are displayed) Labs Reviewed  CBC WITH DIFFERENTIAL/PLATELET - Abnormal; Notable for the following components:      Result Value   HCT 47.0 (*)    Monocytes Absolute 1.1 (*)    All other components within normal limits  URINALYSIS, ROUTINE W REFLEX MICROSCOPIC - Abnormal; Notable for the following components:   APPearance HAZY (*)    Ketones, ur 5 (*)    All other components within normal limits  COMPREHENSIVE METABOLIC PANEL - Abnormal; Notable for the following components:   Glucose, Bld 105 (*)    GFR calc non Af Amer 55 (*)    All other components within normal limits  RESPIRATORY PANEL BY RT PCR (FLU A&B, COVID)  URINE CULTURE  TROPONIN I (HIGH SENSITIVITY)  TROPONIN I (HIGH SENSITIVITY)    EKG EKG Interpretation  Date/Time:  Thursday January 06 2020 10:13:10 EST Ventricular Rate:  78 PR Interval:    QRS Duration: 119 QT Interval:  415 QTC Calculation: 473 R Axis:   48 Text Interpretation: Atrial fibrillation Nonspecific intraventricular conduction delay Probable anterior infarct, age indeterminate Baseline wander in lead(s) V5 No old tracing to compare Confirmed by Isla Pence [id number]) on 01/06/2020 10:18:00 AM   Radiology DG Chest Port 1 View  Result Date: 01/06/2020 CLINICAL DATA:  Altered mental status. EXAM: PORTABLE CHEST 1 VIEW COMPARISON:  None. FINDINGS: Cardiac silhouette is borderline enlarged. No mediastinal or hilar masses or  evidence of adenopathy. There is opacity in the medial right upper lobe, superior to the right hilum. Remainder of the lungs is clear. No convincing pleural effusion and no pneumothorax. Skeletal structures are demineralized but grossly intact. IMPRESSION: 1. Focal opacity in the medial right upper lobe. This could reflect infection. A mass is possible. Recommend follow-up chest CT, preferably with contrast, for further assessment. Electronically Signed   By: Lajean Manes M.D.   On: 01/06/2020 10:58  Medications Ordered in ED Medications  sodium chloride 0.9 % bolus 500 mL (500 mLs Intravenous New Bag/Given 01/06/20 1254)    ED Course  I have reviewed the triage vital signs and the nursing notes.  Pertinent labs & imaging results that were available during my care of the patient were reviewed by me and considered in my medical decision making (see chart for details).  Clinical Course as of Jan 05 1441  Thu Jan 06, 2020  1108 Initial assessment. 86 yo with one day history of altered mental status. Unwitnessed fall out of bed this morning.Aide notes that patient has had foul smelling urine. Infectious workup pending. CXR showing an infiltrate vs mass. CT chest ordered. Also obtaining head CT to r/o bleed vs CVA.   [RC]  3295 Ddx: infectious vs metabolic vs vascular   [RC]  1125 EKG rhythm appears to be afib. No document PMH of this. Will repeat EKG for re-evaluation.   [RC]  1339 Less likely metabolic as no significant lab abnormalities are apparent. Suprisingly, urine looks good, so unlikely to be UTI. No leukocytosis to suggest an infectious process. If head/chest CT look ok, she will likely be stable for discharge and may need to follow up outpatient.   [RC]    Clinical Course User Index [RC] Mitzi Hansen, MD   MDM Rules/Calculators/A&P                      Please see ED course for MDM.  CHADVASC score: 1. 2.8% risk of event per year. Final Clinical Impression(s) / ED  Diagnoses Final diagnoses:  Altered mental status, unspecified altered mental status type    Rx / DC Orders ED Discharge Orders    None       Mitzi Hansen, MD 01/06/20 1443    Isla Pence, MD 01/13/20 [phone]

## 2020-01-06 NOTE — ED Triage Notes (Signed)
Pt in from home via [facility] after unwitnessed fall, slipped from bed per home health aide. Denies any pain, but pt more confused per aide, has dementia baseline. Also walks independently baseline, but unable to walk today per EMS. Strong odor of urine per aide. Went to bed normal, no neuro deficits reported. When EMS arrived, pt also reported sob and heart fluttering. Pt's daughter Pete Glatter) is POA [phone]

## 2020-01-06 NOTE — ED Notes (Signed)
Patient verbalizes understanding of discharge instructions. Opportunity for questioning and answers were provided. Armband removed by staff, pt discharged from ED via wheelchair.  

## 2020-01-07 LAB — URINE CULTURE: Culture: 10000 — AB

## 2020-01-10 ENCOUNTER — Encounter: Payer: Self-pay | Admitting: *Deleted

## 2020-01-10 ENCOUNTER — Telehealth: Payer: Self-pay | Admitting: *Deleted

## 2020-01-10 DIAGNOSIS — R918 Other nonspecific abnormal finding of lung field: Secondary | ICD-10-CM

## 2020-01-10 NOTE — Progress Notes (Signed)
Oncology Nurse Navigator Documentation  Oncology Nurse Navigator Flowsheets 01/10/2020  Navigator Location [facility]-  Referral Date to RadOnc/MedOnc 01/07/2020  Navigator Encounter Type Telephone/patient daughter called back and is able to bring patient to her appt on 01/13/20.  She verbalized understanding of appt time and place.   Telephone Outgoing Call  Treatment Phase Abnormal Scans  Barriers/Navigation Needs Coordination of Care;Education  Education Other  Interventions Coordination of Care;Education  Acuity Level 2-Minimal Needs (1-2 Barriers Identified)  Coordination of Care Appts  Education Method Verbal  Time Spent with Patient 30

## 2020-01-10 NOTE — Telephone Encounter (Signed)
I received referral on Laura Walker.  I called and spoke with her daughter.  Her daughter states that the patient can't walk well and is not moving around well.  I asked that she call PCP and she states they have virtual visit today.  The daughter will call me back with an update.

## 2020-01-11 DIAGNOSIS — R5381 Other malaise: Secondary | ICD-10-CM | POA: Diagnosis not present

## 2020-01-11 DIAGNOSIS — R918 Other nonspecific abnormal finding of lung field: Secondary | ICD-10-CM | POA: Diagnosis not present

## 2020-01-11 DIAGNOSIS — I4891 Unspecified atrial fibrillation: Secondary | ICD-10-CM | POA: Diagnosis not present

## 2020-01-11 DIAGNOSIS — R296 Repeated falls: Secondary | ICD-10-CM | POA: Diagnosis not present

## 2020-01-13 ENCOUNTER — Other Ambulatory Visit: Payer: Self-pay

## 2020-01-13 ENCOUNTER — Inpatient Hospital Stay: Payer: PPO

## 2020-01-13 ENCOUNTER — Encounter: Payer: Self-pay | Admitting: *Deleted

## 2020-01-13 ENCOUNTER — Inpatient Hospital Stay: Payer: PPO | Attending: Internal Medicine | Admitting: Internal Medicine

## 2020-01-13 ENCOUNTER — Encounter: Payer: Self-pay | Admitting: Internal Medicine

## 2020-01-13 ENCOUNTER — Other Ambulatory Visit: Payer: Self-pay | Admitting: *Deleted

## 2020-01-13 VITALS — BP 124/85 | HR 83 | Temp 97.8°F | Resp 17 | Wt 225.7 lb

## 2020-01-13 DIAGNOSIS — R911 Solitary pulmonary nodule: Secondary | ICD-10-CM | POA: Insufficient documentation

## 2020-01-13 DIAGNOSIS — I6782 Cerebral ischemia: Secondary | ICD-10-CM | POA: Diagnosis not present

## 2020-01-13 DIAGNOSIS — F0390 Unspecified dementia without behavioral disturbance: Secondary | ICD-10-CM | POA: Diagnosis not present

## 2020-01-13 DIAGNOSIS — E7800 Pure hypercholesterolemia, unspecified: Secondary | ICD-10-CM | POA: Diagnosis not present

## 2020-01-13 DIAGNOSIS — Z87891 Personal history of nicotine dependence: Secondary | ICD-10-CM | POA: Insufficient documentation

## 2020-01-13 DIAGNOSIS — Z801 Family history of malignant neoplasm of trachea, bronchus and lung: Secondary | ICD-10-CM | POA: Diagnosis not present

## 2020-01-13 DIAGNOSIS — R918 Other nonspecific abnormal finding of lung field: Secondary | ICD-10-CM

## 2020-01-13 DIAGNOSIS — Z79899 Other long term (current) drug therapy: Secondary | ICD-10-CM | POA: Diagnosis not present

## 2020-01-13 DIAGNOSIS — R5383 Other fatigue: Secondary | ICD-10-CM | POA: Insufficient documentation

## 2020-01-13 DIAGNOSIS — N189 Chronic kidney disease, unspecified: Secondary | ICD-10-CM | POA: Diagnosis not present

## 2020-01-13 DIAGNOSIS — G4730 Sleep apnea, unspecified: Secondary | ICD-10-CM | POA: Diagnosis not present

## 2020-01-13 DIAGNOSIS — M8580 Other specified disorders of bone density and structure, unspecified site: Secondary | ICD-10-CM | POA: Diagnosis not present

## 2020-01-13 DIAGNOSIS — E785 Hyperlipidemia, unspecified: Secondary | ICD-10-CM | POA: Diagnosis not present

## 2020-01-13 DIAGNOSIS — F03A0 Unspecified dementia, mild, without behavioral disturbance, psychotic disturbance, mood disturbance, and anxiety: Secondary | ICD-10-CM

## 2020-01-13 LAB — CMP (CANCER CENTER ONLY)
ALT: 14 U/L (ref 0–44)
AST: 10 U/L — ABNORMAL LOW (ref 15–41)
Albumin: 3.6 g/dL (ref 3.5–5.0)
Alkaline Phosphatase: 77 U/L (ref 38–126)
Anion gap: 9 (ref 5–15)
BUN: 16 mg/dL (ref 8–23)
CO2: 27 mmol/L (ref 22–32)
Calcium: 9.6 mg/dL (ref 8.9–10.3)
Chloride: 105 mmol/L (ref 98–111)
Creatinine: 0.9 mg/dL (ref 0.44–1.00)
GFR, Est AFR Am: >60 mL/min (ref >60)
GFR, Estimated: 57 mL/min — ABNORMAL LOW (ref >60)
Glucose, Bld: 71 mg/dL (ref 70–99)
Potassium: 4.3 mmol/L (ref 3.5–5.1)
Sodium: 141 mmol/L (ref 135–145)
Total Bilirubin: 0.3 mg/dL (ref 0.3–1.2)
Total Protein: 6.8 g/dL (ref 6.5–8.1)

## 2020-01-13 LAB — CBC WITH DIFFERENTIAL (CANCER CENTER ONLY)
Abs Immature Granulocytes: 0.02 10*3/uL (ref 0.00–0.07)
Basophils Absolute: 0.1 10*3/uL (ref 0.0–0.1)
Basophils Relative: 1 %
Eosinophils Absolute: 0.3 10*3/uL (ref 0.0–0.5)
Eosinophils Relative: 3 %
HCT: 46.3 % — ABNORMAL HIGH (ref 36.0–46.0)
Hemoglobin: 14.4 g/dL (ref 12.0–15.0)
Immature Granulocytes: 0 %
Lymphocytes Relative: 22 %
Lymphs Abs: 1.8 10*3/uL (ref 0.7–4.0)
MCH: 28.9 pg (ref 26.0–34.0)
MCHC: 31.1 g/dL (ref 30.0–36.0)
MCV: 93 fL (ref 80.0–100.0)
Monocytes Absolute: 0.8 10*3/uL (ref 0.1–1.0)
Monocytes Relative: 10 %
Neutro Abs: 5.3 10*3/uL (ref 1.7–7.7)
Neutrophils Relative %: 64 %
Platelet Count: 285 10*3/uL (ref 150–400)
RBC: 4.98 MIL/uL (ref 3.87–5.11)
RDW: 13.8 % (ref 11.5–15.5)
WBC Count: 8.3 10*3/uL (ref 4.0–10.5)
nRBC: 0 % (ref 0.0–0.2)

## 2020-01-13 NOTE — Progress Notes (Signed)
The proposed treatment discussed in cancer conference 01/13/20 is for discussion purpose only and is not a binding recommendation.  The patient was not physically examined nor present for their treatment options.  Therefore, final treatment plans cannot be decided.

## 2020-01-13 NOTE — Progress Notes (Signed)
Oncology Nurse Navigator Documentation  Oncology Nurse Navigator Flowsheets 01/13/2020  Abnormal Finding Date 01/06/2020  Diagnosis Status Additional Work Up  Navigator Follow Up Date: 01/17/2020  Navigator Follow Up Reason: Appointment Review  Navigator Location [facility]  Referral Date to RadOnc/MedOnc -  Navigator Encounter Type Clinic/MDC/I spoke with patient and daughter today during her first visit with Dr. Julien Nordmann.  She will need further work up and tisue dx of lung mass.  Education on her next steps were given.  I will contact auth coordinator to get PET scan auth.  I will also update Dr. Leonarda Salon office of referral.   [facility] Date 01/13/2020  Multidisiplinary Clinic Type Thoracic  Patient Visit Type MedOnc  Treatment Phase Abnormal Scans  Barriers/Navigation Needs Coordination of Care;Education  Education Other  Interventions Coordination of Care;Education  Acuity Level 3-Moderate Needs (3-4 Barriers Identified)  Coordination of Care Other  Education Method Verbal  Time Spent with Patient 32

## 2020-01-13 NOTE — Progress Notes (Signed)
[facility] [phone]   [phone]  CONSULT NOTE  REFERRING PHYSICIAN: Dr. Alessandra Grout  REASON FOR CONSULTATION:  86 years old white female with right upper lobe lung mass  HPI Laura Walker is a 86 y.o. female with past medical history significant for dyslipidemia, obstructive sleep apnea, osteopenia, dementia as well as history of smoking but quit in 1986.  The patient was supposed to see her orthopedic surgeon for steroid injections in the knee.  Her caregiver saw her early in the morning and found her laying on the floor.  She presented to the emergency department for further evaluation of her condition.  Chest x-ray was performed on 01/06/2020 and showed focal opacity in the medial right upper lobe.  This was followed by CT scan of the chest with contrast on 01/06/2020 and showed 3.6 x 3.2 x 3.1 cm right upper lobe lung mass consistent with a primary lung neoplasm.  There was no other pulmonary lesions or nodules.  There was small 0.8 cm mediastinal lymph node.  CT of the head without contrast performed on the same day showed no acute intracranial abnormalities or skull fractures.  There was remote lacunar infarcts in the left thalamus.  The patient was referred to me today for evaluation and recommendation regarding this abnormality in her scan. When seen today she continues to have memory issues with shortness of breath with exertion as well as fatigue.  She denied having any chest pain, cough or hemoptysis.  She denied having any recent weight loss or night sweats.  She has no nausea, vomiting but has occasional diarrhea with no constipation or abdominal pain.  The patient denied having any headache or visual changes. Family history significant for mother died from lung cancer and she was a non-smoker.  Father died in his sleep. The patient is a widow and has 5 children.  She was accompanied today by her daughter Laura Walker.  The patient used to work as  an Psychologist, prison and probation services.  She has a history of smoking but quit in 1986.  She drinks alcohol occasionally and no history of drug abuse.  HPI  Past Medical History:  Diagnosis Date  . CKD (chronic kidney disease)   . Hypercholesteremia   . Memory loss   . OSA (obstructive sleep apnea)    CPAP  . Osteopenia     Past Surgical History:  Procedure Laterality Date  . ANKLE SURGERY Left    as child, fracture  . BUNIONECTOMY      Family History  Problem Relation Age of Onset  . Cancer - Lung Mother   . Heart disease Father   . Atrial fibrillation Sister     Social History Social History   Tobacco Use  . Smoking status: Former Smoker    Quit date: 12/13/1984    Years since quitting: 35.1  . Smokeless tobacco: Never Used  Substance Use Topics  . Alcohol use: Yes    Comment: occas wine  . Drug use: No    Allergies  Allergen Reactions  . Tramadol Nausea Only    Current Outpatient Medications  Medication Sig Dispense Refill  . acetaminophen (TYLENOL) 325 MG tablet Take 650 mg by mouth.    . cholecalciferol (VITAMIN D) 1000 units tablet Take 5,000 Units by mouth daily.    . ciclopirox (PENLAC) 8 % solution APPLY 1 DROP TOPICALLY TO AFFECTED AREA ONCE DAILY  4  . Cyanocobalamin (VITAMIN B-12) 5000 MCG LOZG Take 2,500 mcg  by mouth.    . donepezil (ARICEPT) 10 MG tablet TAKE 1 TABLET BY MOUTH AT BEDTIME 30 tablet 0  . DULoxetine (CYMBALTA) 60 MG capsule Take 60 mg by mouth daily.  4  . meloxicam (MOBIC) 7.5 MG tablet TAKE 1 TO 2 TABLETS BY MOUTH ONCE DAILY AS NEEDED  4  . memantine (NAMENDA) 10 MG tablet Take 1 tablet by mouth twice daily 60 tablet 0  . Omega-3 Fatty Acids (FISH OIL ADULT GUMMIES PO) Take by mouth. 1400/900 mg    . Red Yeast Rice Extract (RED YEAST RICE PO) Take by mouth.    Marland Kitchen acetaminophen-codeine (TYLENOL #3) 300-30 MG tablet Take by mouth 2 (two) times daily as needed for moderate pain.     No current facility-administered medications for this visit.     Review of Systems  Constitutional: positive for fatigue Eyes: negative Ears, nose, mouth, throat, and face: negative Respiratory: positive for cough and dyspnea on exertion Cardiovascular: negative Gastrointestinal: negative Genitourinary:negative Integument/breast: negative Hematologic/lymphatic: negative Musculoskeletal:negative Neurological: negative Behavioral/Psych: negative Endocrine: negative Allergic/Immunologic: negative  Physical Exam  GGY:IRSWN, healthy, no distress, well nourished and well developed SKIN: skin color, texture, turgor are normal, no rashes or significant lesions HEAD: Normocephalic, No masses, lesions, tenderness or abnormalities EYES: normal, PERRLA, Conjunctiva are pink and non-injected EARS: External ears normal, Canals clear OROPHARYNX:no exudate, no erythema and lips, buccal mucosa, and tongue normal  NECK: supple, no adenopathy, no JVD LYMPH:  no palpable lymphadenopathy, no hepatosplenomegaly BREAST:not examined LUNGS: clear to auscultation , and palpation HEART: regular rate & rhythm, no murmurs and no gallops ABDOMEN:abdomen soft, non-tender, normal bowel sounds and no masses or organomegaly BACK: No CVA tenderness, Range of motion is normal EXTREMITIES:no joint deformities, effusion, or inflammation, no edema  NEURO: alert & oriented x 3 with fluent speech, no focal motor/sensory deficits  PERFORMANCE STATUS: ECOG 1  LABORATORY DATA: Lab Results  Component Value Date   WBC 8.3 01/13/2020   HGB 14.4 01/13/2020   HCT 46.3 (H) 01/13/2020   MCV 93.0 01/13/2020   PLT 285 01/13/2020      Chemistry      Component Value Date/Time   NA 143 01/06/2020 1201   K 4.1 01/06/2020 1201   CL 105 01/06/2020 1201   CO2 26 01/06/2020 1201   BUN 22 01/06/2020 1201   CREATININE 0.93 01/06/2020 1201      Component Value Date/Time   CALCIUM 9.7 01/06/2020 1201   ALKPHOS 74 01/06/2020 1201   AST 17 01/06/2020 1201   ALT 15 01/06/2020 1201    BILITOT 0.9 01/06/2020 1201       RADIOGRAPHIC STUDIES: CT Head Wo Contrast  Result Date: 01/06/2020 CLINICAL DATA:  Head trauma, minor (Age >= 65y) Dementia patient with altered mental status and fall. EXAM: CT HEAD WITHOUT CONTRAST TECHNIQUE: Contiguous axial images were obtained from the base of the skull through the vertex without intravenous contrast. COMPARISON:  None. FINDINGS: Brain: No intracranial hemorrhage, mass effect, or midline shift. Age related atrophy. No hydrocephalus. The basilar cisterns are patent. Advanced periventricular white matter hypodensity, most commonly chronic small vessel ischemia. Remote lacunar infarct versus prominent perivascular space in the left thalamus. No evidence of territorial infarct or acute ischemia. No extra-axial or intracranial fluid collection. Vascular: Atherosclerosis of skullbase vasculature without hyperdense vessel or abnormal calcification. Skull: No fracture or focal lesion. Sinuses/Orbits: Paranasal sinuses and mastoid air cells are clear. The visualized orbits are unremarkable. Other: None. IMPRESSION: 1. No acute intracranial abnormality. No  skull fracture. 2. Age related atrophy.  Advanced chronic small vessel ischemia. 3. Remote lacunar infarct versus prominent perivascular space in the left thalamus. Electronically Signed   By: Keith Rake M.D.   On: 01/06/2020 15:42   CT Chest W Contrast  Result Date: 01/06/2020 CLINICAL DATA:  Followup abnormal chest x-ray. EXAM: CT CHEST WITH CONTRAST TECHNIQUE: Multidetector CT imaging of the chest was performed during intravenous contrast administration. CONTRAST:  54mL OMNIPAQUE IOHEXOL 300 MG/ML  SOLN COMPARISON:  Chest x-ray, same date. FINDINGS: Cardiovascular: The heart is normal in size for age. No pericardial effusion. Moderate atherosclerotic calcifications involving the thoracic aorta but no focal aneurysm or dissection. Scattered three-vessel coronary artery calcifications are noted. The  pulmonary arteries appear normal. Mediastinum/Nodes: Small, sub 8 mm mediastinal lymph nodes. No mass or overt adenopathy. The esophagus is grossly normal. Lungs/Pleura: There is a 3.6 x 3.2 x 3.1 cm right upper lobe lung mass correlating with the x-ray abnormality. Findings most consistent with a primary lung neoplasm. No other pulmonary lesions or pulmonary nodules. No infiltrates or effusions. Upper Abdomen: No significant upper abdominal findings are identified. No worrisome hepatic or adrenal gland lesions. Moderate atherosclerotic calcifications involving the upper abdominal aorta and branch vessels. Bilateral parapelvic renal cysts are noted. Musculoskeletal: No breast masses, supraclavicular or axillary lymphadenopathy. The bony thorax is intact. Moderate degenerative changes involving the spine. IMPRESSION: 1. 3.6 x 3.2 x 3.1 cm right upper lobe lung mass most consistent with primary lung neoplasm. Recommend referral to multi-disciplinary thoracic oncology clinic [facility]). Patient will likely need PET-CT for further evaluation and staging. 2. No mediastinal or hilar mass or adenopathy. 3. No findings for upper abdominal metastatic disease. 4. Age related atherosclerotic calcifications involving the thoracic and abdominal aorta and branch vessels including the coronary arteries. Aortic Atherosclerosis (ICD10-I70.0). Electronically Signed   By: Marijo Sanes M.D.   On: 01/06/2020 16:19   DG Chest Port 1 View  Result Date: 01/06/2020 CLINICAL DATA:  Altered mental status. EXAM: PORTABLE CHEST 1 VIEW COMPARISON:  None. FINDINGS: Cardiac silhouette is borderline enlarged. No mediastinal or hilar masses or evidence of adenopathy. There is opacity in the medial right upper lobe, superior to the right hilum. Remainder of the lungs is clear. No convincing pleural effusion and no pneumothorax. Skeletal structures are demineralized but grossly intact. IMPRESSION: 1. Focal opacity in the medial right upper lobe. This  could reflect infection. A mass is possible. Recommend follow-up chest CT, preferably with contrast, for further assessment. Electronically Signed   By: Lajean Manes M.D.   On: 01/06/2020 10:58    ASSESSMENT: This is a very pleasant 86 years old white female with highly suspicious stage Ib (T2a, N0, M0) lung cancer pending further staging work-up and tissue diagnosis.  She presented with right upper lobe lung mass and no clear mediastinal adenopathy at this point.   PLAN: I had a lengthy discussion with the patient and her daughter today about her current condition and further investigation to confirm her diagnosis and also to complete her staging work-up. I personally and independently reviewed the scan images and discussed the result and showed the images to the patient and her daughter. I recommended for the patient to complete the staging work-up by ordering a PET scan to rule out any other metastatic disease. I will also refer the patient to cardiothoracic surgery for evaluation and consideration of bronchoscopy for tissue diagnosis and also to evaluate the patient for any surgical resection but this is less likely. I  will arrange for the patient to come back for follow-up visit in 2 weeks for evaluation and more detailed discussion of her treatment options based on the final staging work-up and tissue diagnosis. If the patient has no evidence of metastatic disease or other lesions and she is not a good surgical candidate, she would benefit from curative radiotherapy to the right upper lobe lung mass. She was advised to call immediately if she has any concerning symptoms in the interval. The patient voices understanding of current disease status and treatment options and is in agreement with the current care plan.  All questions were answered. The patient knows to call the clinic with any problems, questions or concerns. We can certainly see the patient much sooner if necessary.  Thank you so  much for allowing me to participate in the care of Laura Walker. I will continue to follow up the patient with you and assist in her care.  The total time spent in the appointment was 60 minutes.  Disclaimer: This note was dictated with voice recognition software. Similar sounding words can inadvertently be transcribed and may not be corrected upon review.   Eilleen Kempf January 13, 2020, 2:14 PM

## 2020-01-14 ENCOUNTER — Telehealth: Payer: Self-pay | Admitting: General Practice

## 2020-01-14 NOTE — Telephone Encounter (Signed)
[facility] CSW Progress Notes  Call to daughter at request of Norton Blizzard, nurse navigator.  Daughter lives in town, 3 other siblings live out of [place].  Local daughter is primary caregiver and also works full time job. Reviewed expected stresses of phase of cancer treatment patient is in - newly diagnosed, still in assessment/treatment planning phase.  Provided support/resources for caregiver needs, encouraged to contact us as needed.  Edwyna Shell, LCSW Clinical Social Worker Phone:  [phone] Cell:  [phone]

## 2020-01-17 ENCOUNTER — Telehealth: Payer: Self-pay

## 2020-01-17 NOTE — Telephone Encounter (Signed)
Patient's daughter wanted to know if Dr. Julien Nordmann would like to see patient before her thoracic surgeon appointment or after. Patient's next provider visit is 01/26/20 at 11:00 AM and thoracic surgeon visit is same day at 4:15 PM.

## 2020-01-17 NOTE — Telephone Encounter (Signed)
After she meets with the thoracic surgeon.  Thank you.

## 2020-01-18 ENCOUNTER — Telehealth: Payer: Self-pay | Admitting: Internal Medicine

## 2020-01-18 ENCOUNTER — Telehealth: Payer: Self-pay

## 2020-01-18 NOTE — Telephone Encounter (Signed)
LVM for patient's daughter that Dr. Julien Nordmann would see patient after her visit with the thoracic surgeon.  Scheduling will call with exact date and time.  Scheduling message sent.

## 2020-01-18 NOTE — Telephone Encounter (Signed)
Rescheduled 3/3 appt to 3/9 per 2/23 sch msg. Pt's daughter is aware of new appt date and time.

## 2020-01-21 ENCOUNTER — Ambulatory Visit: Payer: PPO | Attending: Internal Medicine

## 2020-01-21 ENCOUNTER — Telehealth: Payer: Self-pay | Admitting: Medical Oncology

## 2020-01-21 ENCOUNTER — Encounter: Payer: Self-pay | Admitting: Internal Medicine

## 2020-01-21 DIAGNOSIS — Z23 Encounter for immunization: Secondary | ICD-10-CM

## 2020-01-21 NOTE — Progress Notes (Signed)
   Covid-19 Vaccination Clinic  Name:  Laura Walker    MRN: [id number] DOB: [birth year]  01/21/2020  [name] was observed post Covid-19 immunization for 15 minutes without incidence. She was provided with Vaccine Information Sheet and instruction to access the V-Safe system.   [name] was instructed to call 911 with any severe reactions post vaccine: Marland Kitchen Difficulty breathing  . Swelling of your face and throat  . A fast heartbeat  . A bad rash all over your body  . Dizziness and weakness    Immunizations Administered    Name Date Dose VIS Date Route   Pfizer COVID-19 Vaccine 01/21/2020 11:22 AM 0.3 mL 11/05/2019 Intramuscular   Manufacturer: [employer]   Lot: [id number]   [place]: [address]-0

## 2020-01-21 NOTE — Telephone Encounter (Signed)
PA needed for non emergent transportation . I faxed the form to [facility] at [phone].

## 2020-01-24 ENCOUNTER — Telehealth: Payer: Self-pay | Admitting: Medical Oncology

## 2020-01-24 ENCOUNTER — Encounter: Payer: Self-pay | Admitting: Medical Oncology

## 2020-01-24 NOTE — Telephone Encounter (Signed)
DME-E-Mail sent to pt to contact Dr Stephanie Acre  for  hospital bed and wheelchair .

## 2020-01-24 NOTE — Telephone Encounter (Signed)
[place]tar, [place] [facility].  Spoke w/Valerie at [phone]. Called this information to [facility] .

## 2020-01-24 NOTE — Telephone Encounter (Signed)
email sent to pt to  get the name of the ambulance transport company that will transport her. It is needed for the PA form.

## 2020-01-24 NOTE — Telephone Encounter (Signed)
Ambulance transport auth # [id number].

## 2020-01-25 ENCOUNTER — Encounter (HOSPITAL_COMMUNITY)
Admission: RE | Admit: 2020-01-25 | Discharge: 2020-01-25 | Disposition: A | Discharge status: Home / Self Care | Payer: PPO | Source: Ambulatory Visit | Attending: Internal Medicine | Admitting: Internal Medicine

## 2020-01-25 ENCOUNTER — Other Ambulatory Visit: Payer: Self-pay

## 2020-01-25 ENCOUNTER — Encounter: Payer: PPO | Admitting: Thoracic Surgery (Cardiothoracic Vascular Surgery)

## 2020-01-25 DIAGNOSIS — K5730 Diverticulosis of large intestine without perforation or abscess without bleeding: Secondary | ICD-10-CM | POA: Diagnosis not present

## 2020-01-25 DIAGNOSIS — K449 Diaphragmatic hernia without obstruction or gangrene: Secondary | ICD-10-CM | POA: Insufficient documentation

## 2020-01-25 DIAGNOSIS — I2510 Atherosclerotic heart disease of native coronary artery without angina pectoris: Secondary | ICD-10-CM | POA: Diagnosis not present

## 2020-01-25 DIAGNOSIS — K429 Umbilical hernia without obstruction or gangrene: Secondary | ICD-10-CM | POA: Diagnosis not present

## 2020-01-25 DIAGNOSIS — R911 Solitary pulmonary nodule: Secondary | ICD-10-CM | POA: Diagnosis not present

## 2020-01-25 DIAGNOSIS — I517 Cardiomegaly: Secondary | ICD-10-CM | POA: Insufficient documentation

## 2020-01-25 DIAGNOSIS — M47816 Spondylosis without myelopathy or radiculopathy, lumbar region: Secondary | ICD-10-CM | POA: Diagnosis not present

## 2020-01-25 DIAGNOSIS — R918 Other nonspecific abnormal finding of lung field: Secondary | ICD-10-CM | POA: Diagnosis not present

## 2020-01-25 LAB — GLUCOSE, CAPILLARY: Glucose-Capillary: 97 mg/dL (ref 70–99)

## 2020-01-25 MED ORDER — FLUDEOXYGLUCOSE F - 18 (FDG) INJECTION
11.2800 | Freq: Once | INTRAVENOUS | Status: AC | PRN
  Administered 2020-01-25: 11.28 via INTRAVENOUS

## 2020-01-26 ENCOUNTER — Encounter: Payer: Self-pay | Admitting: Thoracic Surgery (Cardiothoracic Vascular Surgery)

## 2020-01-26 ENCOUNTER — Institutional Professional Consult (permissible substitution): Payer: PPO | Admitting: Thoracic Surgery (Cardiothoracic Vascular Surgery)

## 2020-01-26 ENCOUNTER — Telehealth: Payer: Self-pay | Admitting: Medical Oncology

## 2020-01-26 ENCOUNTER — Encounter: Payer: Self-pay | Admitting: Internal Medicine

## 2020-01-26 ENCOUNTER — Inpatient Hospital Stay: Payer: PPO | Admitting: Internal Medicine

## 2020-01-26 VITALS — BP 112/69 | HR 74 | Temp 97.5°F | Resp 20 | Ht 63.0 in | Wt 225.0 lb

## 2020-01-26 DIAGNOSIS — R911 Solitary pulmonary nodule: Secondary | ICD-10-CM | POA: Diagnosis not present

## 2020-01-26 DIAGNOSIS — G4733 Obstructive sleep apnea (adult) (pediatric): Secondary | ICD-10-CM | POA: Diagnosis not present

## 2020-01-26 NOTE — Telephone Encounter (Signed)
Dtr paid someone to transport her mother yesterday and today to her appts.  Pt auth for [facility] is valid beginning 01/25/20.Dtr notified that she can call [facility] for setting up future transportation.

## 2020-01-26 NOTE — Progress Notes (Signed)
PCP is [name], MD Referring Provider is Curt Bears, MD  Chief Complaint  Patient presents with  . Lung Lesion    Surgical eval, PET scan 01/25/20, Chest CT and Head CT 01/06/20    HPI: Laura Walker is sent for consultation regarding a right upper lobe lung mass  Laura Walker is an 86 year old woman with a history of obesity, obstructive sleep apnea, osteopenia, hyperlipidemia, chronic kidney disease, and dementia.  She is very hard of hearing and the majority of her history comes from her daughter.  She lives with her daughter.  Her daughter goes to work early in the morning and then a caregiver usually comes just before Laura Walker wakes up.  On 01/06/2020 the caregiver found her on the floor.  It is unclear how long she was on the floor but Laura Walker thinks it was about half an hour.  She was taken to the emergency room.  A CT of the head showed remote infarct but no acute intracranial abnormality.  Chest x-ray showed a probable lung mass.  A CT of the chest showed a 3.6 x 3.2 x 3.1 cm spiculated right upper lobe lung mass.  There was no mediastinal or hilar adenopathy.  She does have a history of tobacco abuse but quit in 1986.  Her mother was a non-smoker and died of lung cancer in her late 59s.  Laura Walker denies shortness of breath but her daughter says that she is very short of breath with even minimal ambulation.  She says that her mother has gone downhill considerably over the past 6 months.  Appetite is gone down as well, but she has not lost any weight.  Zubrod Score: At the time of surgery this patient's most appropriate activity status/level should be described as: []     0    Normal activity, no symptoms []     1    Restricted in physical strenuous activity but ambulatory, able to do out light work []     2    Ambulatory and capable of self care, unable to do work activities, up and about >50 % of waking hours                              [x]     3    Only limited self  care, in bed greater than 50% of waking hours []     4    Completely disabled, no self care, confined to bed or chair []     5    Moribund  Past Medical History:  Diagnosis Date  . CKD (chronic kidney disease)   . Hypercholesteremia   . Memory loss   . OSA (obstructive sleep apnea)    CPAP  . Osteopenia     Past Surgical History:  Procedure Laterality Date  . ANKLE SURGERY Left    as child, fracture  . BUNIONECTOMY      Family History  Problem Relation Age of Onset  . Cancer - Lung Mother   . Heart disease Father   . Atrial fibrillation Sister     Social History Social History   Tobacco Use  . Smoking status: Former Smoker    Quit date: 12/13/1984    Years since quitting: 35.1  . Smokeless tobacco: Never Used  Substance Use Topics  . Alcohol use: Yes    Comment: occas wine  . Drug use: No    Current Outpatient Medications  Medication  Sig Dispense Refill  . acetaminophen (TYLENOL) 325 MG tablet Take 650 mg by mouth.    . cholecalciferol (VITAMIN D) 1000 units tablet Take 5,000 Units by mouth daily.    . ciclopirox (PENLAC) 8 % solution APPLY 1 DROP TOPICALLY TO AFFECTED AREA ONCE DAILY  4  . Cyanocobalamin (VITAMIN B-12) 5000 MCG LOZG Take 2,500 mcg by mouth.    . donepezil (ARICEPT) 10 MG tablet TAKE 1 TABLET BY MOUTH AT BEDTIME 30 tablet 0  . DULoxetine (CYMBALTA) 60 MG capsule Take 60 mg by mouth daily. Takes 1/2 tab (30 mg)  4  . meloxicam (MOBIC) 7.5 MG tablet TAKE 1 TO 2 TABLETS BY MOUTH ONCE DAILY AS NEEDED  4  . memantine (NAMENDA) 10 MG tablet Take 1 tablet by mouth twice daily 60 tablet 0  . Omega-3 Fatty Acids (FISH OIL ADULT GUMMIES PO) Take by mouth. 1400/900 mg    . Red Yeast Rice Extract (RED YEAST RICE PO) Take by mouth.    Marland Kitchen acetaminophen-codeine (TYLENOL #3) 300-30 MG tablet Take by mouth 2 (two) times daily as needed for moderate pain.     No current facility-administered medications for this visit.    Allergies  Allergen Reactions  .  Tramadol Nausea Only    Review of Systems  Constitutional: Positive for appetite change. Negative for unexpected weight change.  HENT: Positive for hearing loss. Negative for trouble swallowing and voice change.   Respiratory: Positive for shortness of breath. Negative for cough and wheezing.   Cardiovascular: Negative for chest pain.  Genitourinary: Negative for difficulty urinating and dysuria.  Musculoskeletal: Positive for arthralgias and joint swelling.  Neurological: Positive for weakness.  Hematological: Negative for adenopathy. Bruises/bleeds easily.  All other systems reviewed and are negative.   BP 112/69 (BP Location: Right Arm, Patient Position: Sitting, Cuff Size: Large)   Pulse 74   Temp (!) 97.5 F (36.4 C) (Temporal)   Resp 20   Ht 5\' 3"  (1.6 m)   Wt 225 lb (102.1 kg)   SpO2 97% Comment: RA  BMI 39.86 kg/m  Physical Exam Vitals reviewed.  Constitutional:      General: She is not in acute distress.    Appearance: She is obese.  Eyes:     Extraocular Movements: Extraocular movements intact.  Cardiovascular:     Rate and Rhythm: Normal rate and regular rhythm.     Heart sounds: No murmur.  Pulmonary:     Effort: Pulmonary effort is normal. No respiratory distress.     Breath sounds: Normal breath sounds. No wheezing or rales.  Abdominal:     General: There is no distension.     Tenderness: There is no abdominal tenderness.  Musculoskeletal:     Right lower leg: Edema (Mild) present.     Left lower leg: Edema (Mild) present.  Lymphadenopathy:     Cervical: No cervical adenopathy.  Skin:    General: Skin is warm and dry.  Neurological:     General: No focal deficit present.     Mental Status: She is alert and oriented to person, place, and time.    Diagnostic Tests: CT CHEST WITH CONTRAST  TECHNIQUE: Multidetector CT imaging of the chest was performed during intravenous contrast administration.  CONTRAST:  64mL OMNIPAQUE IOHEXOL 300 MG/ML   SOLN  COMPARISON:  Chest x-ray, same date.  FINDINGS: Cardiovascular: The heart is normal in size for age. No pericardial effusion. Moderate atherosclerotic calcifications involving the thoracic aorta but no focal aneurysm  or dissection. Scattered three-vessel coronary artery calcifications are noted. The pulmonary arteries appear normal.  Mediastinum/Nodes: Small, sub 8 mm mediastinal lymph nodes. No mass or overt adenopathy. The esophagus is grossly normal.  Lungs/Pleura: There is a 3.6 x 3.2 x 3.1 cm right upper lobe lung mass correlating with the x-ray abnormality. Findings most consistent with a primary lung neoplasm.  No other pulmonary lesions or pulmonary nodules. No infiltrates or effusions.  Upper Abdomen: No significant upper abdominal findings are identified. No worrisome hepatic or adrenal gland lesions. Moderate atherosclerotic calcifications involving the upper abdominal aorta and branch vessels. Bilateral parapelvic renal cysts are noted.  Musculoskeletal: No breast masses, supraclavicular or axillary lymphadenopathy.  The bony thorax is intact. Moderate degenerative changes involving the spine.  IMPRESSION: 1. 3.6 x 3.2 x 3.1 cm right upper lobe lung mass most consistent with primary lung neoplasm. Recommend referral to multi-disciplinary thoracic oncology clinic [facility]). Patient will likely need PET-CT for further evaluation and staging. 2. No mediastinal or hilar mass or adenopathy. 3. No findings for upper abdominal metastatic disease. 4. Age related atherosclerotic calcifications involving the thoracic and abdominal aorta and branch vessels including the coronary arteries.  Aortic Atherosclerosis (ICD10-I70.0).   Electronically Signed   By: Marijo Sanes M.D.   On: 01/06/2020 16:19 NUCLEAR MEDICINE PET SKULL BASE TO THIGH  TECHNIQUE: 11.3 mCi F-18 FDG was injected intravenously. Full-ring PET imaging was performed from the skull  base to thigh after the radiotracer. CT data was obtained and used for attenuation correction and anatomic localization.  Fasting blood glucose: 97 mg/dl  COMPARISON:  Chest CT 01/06/2020  FINDINGS: Mediastinal blood pool activity: SUV max 3.0  Liver activity: SUV max NA  NECK: No significant abnormal hypermetabolic activity in this region.  Incidental CT findings: Bilateral common carotid atherosclerotic calcification  CHEST: 3.3 by 2.2 cm right upper lobe mass has maximum SUV of 4.3, favoring low-grade malignancy.  Incidental CT findings: Coronary, aortic arch, and branch vessel atherosclerotic vascular disease. Mild cardiomegaly. Small type 1 hiatal hernia.  ABDOMEN/PELVIS: Focally accentuated activity in the distal transverse colon, with a 1.1 cm focus of activity having a maximum SUV of 14.0. Other segmental accentuated bowel activity is most likely physiologic, but this focus of activity is nonspecific and could be physiologic or due to a polyp.  Incidental CT findings: Descending and sigmoid colon diverticulosis. Aortoiliac atherosclerotic vascular disease. Peripelvic renal cysts bilaterally. Left renal atrophy. Questionable punctate 1-2 mm nonobstructive renal calculi in the lower poles of both kidneys.  SKELETON: No significant abnormal hypermetabolic activity in this region.  Incidental CT findings: Bilateral degenerative glenohumeral arthropathy. Moderate-sized umbilical hernia contains adipose tissue. Lumbar spondylosis and degenerative disc disease.  IMPRESSION: 1. The 3.2 by 2.2 cm right upper lobe mass has low-grade metabolic activity, maximum SUV 4.3, favoring low-grade malignancy. No findings of distant metastatic spread. 2. Small focus of accentuated activity in the distal transverse colon, polyp versus focal physiologic activity. Correlate with the patient's colon screening histor the findings noted above.  Y in determining whether  colonoscopy is appropriate. 3. Other imaging findings of potential clinical significance: Aortic Atherosclerosis (ICD10-I70.0). Coronary atherosclerosis. Mild cardiomegaly. Small type 1 hiatal hernia. Moderate-sized umbilical hernia containing adipose tissue. Descending and sigmoid colon diverticulosis. Left renal atrophy. Probable mild punctate nonobstructive bilateral nephrolithiasis. Degenerative glenohumeral arthropathy bilaterally. Lumbar spondylosis and degenerative disc disease.   Electronically Signed   By: Van Clines M.D.   On: 01/25/2020 14:10 I personally reviewed the CT and PET/CT images and concur  with the findings noted above  Impression: Laura Walker is an 86 year old woman with a history of obesity, obstructive sleep apnea, hyperlipidemia, osteopenia, and dementia.  She was brought to the emergency room about a month ago after being found in the floor.  She was not unconscious.  Work-up showed a 3-1/2 cm spiculated right upper lobe lung mass highly suspicious for a primary bronchogenic carcinoma.  On PET CT that mass is hypermetabolic with an SUV of 4.3.  This most likely is a primary bronchogenic carcinoma and needs to be considered that unless it can be proven otherwise.  Infectious and inflammatory nodules are also in the differential but are less likely.  I had a long discussion with Laura Walker and her daughter.  I reviewed the CT and PET/CT images with them.  It is unclear how much of the conversation that Laura Walker was able to follow.    I discussed the possibility of doing a navigational bronchoscopy for diagnostic purposes.  They understand this would be an outpatient procedure, but done in the operating room under general anesthesia.  They understand this is generally a safe procedure but at her age there is always a risk for significant complications.  I informed them of the indications, risks, benefits, and alternatives.  They understand this would be  strictly diagnostic.  They understand the risks include, but are not limited to death, stroke, MI, blood clots, pneumothorax, bleeding, and failure to make a diagnosis.  They understand there is no guarantee of a definitive diagnosis although I think in all likelihood we should be able to get one based on the size and location.  We discussed issues around biopsying the lesion and wanting to make sure that she was interested in pursuing treatment before we did so.  She is not a surgical candidate, but could be treated with radiation.  She might also be a candidate for targeted therapy, although I do not think she would be a candidate for chemotherapy.  Although her daughter is interested in pursuing a diagnosis and treatment, Mrs. Citro has some reservations about that.  They are going to discuss this issue and also talked to her other children before making a final decision as to how they would like to proceed.  I did offer to arrange for them to talk to radiation oncology before the biopsy if they so desired.   Plan: Mrs. Morlock and her daughter will discuss the issue and also speak with other family members. They will call if they would like to proceed with navigational bronchoscopy for diagnostic purposes.  Melrose Nakayama, MD [facility] [phone]

## 2020-01-26 NOTE — H&P (View-Only) (Signed)
PCP is [name], MD Referring Provider is Curt Bears, MD  Chief Complaint  Patient presents with  . Lung Lesion    Surgical eval, PET scan 01/25/20, Chest CT and Head CT 01/06/20    HPI: Laura Walker is sent for consultation regarding a right upper lobe lung mass  Laura Walker is an 86 year old woman with a history of obesity, obstructive sleep apnea, osteopenia, hyperlipidemia, chronic kidney disease, and dementia.  She is very hard of hearing and the majority of her history comes from her daughter.  She lives with her daughter.  Her daughter goes to work early in the morning and then a caregiver usually comes just before Laura Walker wakes up.  On 01/06/2020 the caregiver found her on the floor.  It is unclear how long she was on the floor but Laura Walker thinks it was about half an hour.  She was taken to the emergency room.  A CT of the head showed remote infarct but no acute intracranial abnormality.  Chest x-ray showed a probable lung mass.  A CT of the chest showed a 3.6 x 3.2 x 3.1 cm spiculated right upper lobe lung mass.  There was no mediastinal or hilar adenopathy.  She does have a history of tobacco abuse but quit in 1986.  Her mother was a non-smoker and died of lung cancer in her late 68s.  Laura Walker denies shortness of breath but her daughter says that she is very short of breath with even minimal ambulation.  She says that her mother has gone downhill considerably over the past 6 months.  Appetite is gone down as well, but she has not lost any weight.  Zubrod Score: At the time of surgery this patient's most appropriate activity status/level should be described as: []     0    Normal activity, no symptoms []     1    Restricted in physical strenuous activity but ambulatory, able to do out light work []     2    Ambulatory and capable of self care, unable to do work activities, up and about >50 % of waking hours                              [x]     3    Only limited self  care, in bed greater than 50% of waking hours []     4    Completely disabled, no self care, confined to bed or chair []     5    Moribund  Past Medical History:  Diagnosis Date  . CKD (chronic kidney disease)   . Hypercholesteremia   . Memory loss   . OSA (obstructive sleep apnea)    CPAP  . Osteopenia     Past Surgical History:  Procedure Laterality Date  . ANKLE SURGERY Left    as child, fracture  . BUNIONECTOMY      Family History  Problem Relation Age of Onset  . Cancer - Lung Mother   . Heart disease Father   . Atrial fibrillation Sister     Social History Social History   Tobacco Use  . Smoking status: Former Smoker    Quit date: 12/13/1984    Years since quitting: 35.1  . Smokeless tobacco: Never Used  Substance Use Topics  . Alcohol use: Yes    Comment: occas wine  . Drug use: No    Current Outpatient Medications  Medication  Sig Dispense Refill  . acetaminophen (TYLENOL) 325 MG tablet Take 650 mg by mouth.    . cholecalciferol (VITAMIN D) 1000 units tablet Take 5,000 Units by mouth daily.    . ciclopirox (PENLAC) 8 % solution APPLY 1 DROP TOPICALLY TO AFFECTED AREA ONCE DAILY  4  . Cyanocobalamin (VITAMIN B-12) 5000 MCG LOZG Take 2,500 mcg by mouth.    . donepezil (ARICEPT) 10 MG tablet TAKE 1 TABLET BY MOUTH AT BEDTIME 30 tablet 0  . DULoxetine (CYMBALTA) 60 MG capsule Take 60 mg by mouth daily. Takes 1/2 tab (30 mg)  4  . meloxicam (MOBIC) 7.5 MG tablet TAKE 1 TO 2 TABLETS BY MOUTH ONCE DAILY AS NEEDED  4  . memantine (NAMENDA) 10 MG tablet Take 1 tablet by mouth twice daily 60 tablet 0  . Omega-3 Fatty Acids (FISH OIL ADULT GUMMIES PO) Take by mouth. 1400/900 mg    . Red Yeast Rice Extract (RED YEAST RICE PO) Take by mouth.    Marland Kitchen acetaminophen-codeine (TYLENOL #3) 300-30 MG tablet Take by mouth 2 (two) times daily as needed for moderate pain.     No current facility-administered medications for this visit.    Allergies  Allergen Reactions  .  Tramadol Nausea Only    Review of Systems  Constitutional: Positive for appetite change. Negative for unexpected weight change.  HENT: Positive for hearing loss. Negative for trouble swallowing and voice change.   Respiratory: Positive for shortness of breath. Negative for cough and wheezing.   Cardiovascular: Negative for chest pain.  Genitourinary: Negative for difficulty urinating and dysuria.  Musculoskeletal: Positive for arthralgias and joint swelling.  Neurological: Positive for weakness.  Hematological: Negative for adenopathy. Bruises/bleeds easily.  All other systems reviewed and are negative.   BP 112/69 (BP Location: Right Arm, Patient Position: Sitting, Cuff Size: Large)   Pulse 74   Temp (!) 97.5 F (36.4 C) (Temporal)   Resp 20   Ht 5\' 3"  (1.6 m)   Wt 225 lb (102.1 kg)   SpO2 97% Comment: RA  BMI 39.86 kg/m  Physical Exam Vitals reviewed.  Constitutional:      General: She is not in acute distress.    Appearance: She is obese.  Eyes:     Extraocular Movements: Extraocular movements intact.  Cardiovascular:     Rate and Rhythm: Normal rate and regular rhythm.     Heart sounds: No murmur.  Pulmonary:     Effort: Pulmonary effort is normal. No respiratory distress.     Breath sounds: Normal breath sounds. No wheezing or rales.  Abdominal:     General: There is no distension.     Tenderness: There is no abdominal tenderness.  Musculoskeletal:     Right lower leg: Edema (Mild) present.     Left lower leg: Edema (Mild) present.  Lymphadenopathy:     Cervical: No cervical adenopathy.  Skin:    General: Skin is warm and dry.  Neurological:     General: No focal deficit present.     Mental Status: She is alert and oriented to person, place, and time.    Diagnostic Tests: CT CHEST WITH CONTRAST  TECHNIQUE: Multidetector CT imaging of the chest was performed during intravenous contrast administration.  CONTRAST:  18mL OMNIPAQUE IOHEXOL 300 MG/ML   SOLN  COMPARISON:  Chest x-ray, same date.  FINDINGS: Cardiovascular: The heart is normal in size for age. No pericardial effusion. Moderate atherosclerotic calcifications involving the thoracic aorta but no focal aneurysm  or dissection. Scattered three-vessel coronary artery calcifications are noted. The pulmonary arteries appear normal.  Mediastinum/Nodes: Small, sub 8 mm mediastinal lymph nodes. No mass or overt adenopathy. The esophagus is grossly normal.  Lungs/Pleura: There is a 3.6 x 3.2 x 3.1 cm right upper lobe lung mass correlating with the x-ray abnormality. Findings most consistent with a primary lung neoplasm.  No other pulmonary lesions or pulmonary nodules. No infiltrates or effusions.  Upper Abdomen: No significant upper abdominal findings are identified. No worrisome hepatic or adrenal gland lesions. Moderate atherosclerotic calcifications involving the upper abdominal aorta and branch vessels. Bilateral parapelvic renal cysts are noted.  Musculoskeletal: No breast masses, supraclavicular or axillary lymphadenopathy.  The bony thorax is intact. Moderate degenerative changes involving the spine.  IMPRESSION: 1. 3.6 x 3.2 x 3.1 cm right upper lobe lung mass most consistent with primary lung neoplasm. Recommend referral to multi-disciplinary thoracic oncology clinic [facility]). Patient will likely need PET-CT for further evaluation and staging. 2. No mediastinal or hilar mass or adenopathy. 3. No findings for upper abdominal metastatic disease. 4. Age related atherosclerotic calcifications involving the thoracic and abdominal aorta and branch vessels including the coronary arteries.  Aortic Atherosclerosis (ICD10-I70.0).   Electronically Signed   By: Marijo Sanes M.D.   On: 01/06/2020 16:19 NUCLEAR MEDICINE PET SKULL BASE TO THIGH  TECHNIQUE: 11.3 mCi F-18 FDG was injected intravenously. Full-ring PET imaging was performed from the skull  base to thigh after the radiotracer. CT data was obtained and used for attenuation correction and anatomic localization.  Fasting blood glucose: 97 mg/dl  COMPARISON:  Chest CT 01/06/2020  FINDINGS: Mediastinal blood pool activity: SUV max 3.0  Liver activity: SUV max NA  NECK: No significant abnormal hypermetabolic activity in this region.  Incidental CT findings: Bilateral common carotid atherosclerotic calcification  CHEST: 3.3 by 2.2 cm right upper lobe mass has maximum SUV of 4.3, favoring low-grade malignancy.  Incidental CT findings: Coronary, aortic arch, and branch vessel atherosclerotic vascular disease. Mild cardiomegaly. Small type 1 hiatal hernia.  ABDOMEN/PELVIS: Focally accentuated activity in the distal transverse colon, with a 1.1 cm focus of activity having a maximum SUV of 14.0. Other segmental accentuated bowel activity is most likely physiologic, but this focus of activity is nonspecific and could be physiologic or due to a polyp.  Incidental CT findings: Descending and sigmoid colon diverticulosis. Aortoiliac atherosclerotic vascular disease. Peripelvic renal cysts bilaterally. Left renal atrophy. Questionable punctate 1-2 mm nonobstructive renal calculi in the lower poles of both kidneys.  SKELETON: No significant abnormal hypermetabolic activity in this region.  Incidental CT findings: Bilateral degenerative glenohumeral arthropathy. Moderate-sized umbilical hernia contains adipose tissue. Lumbar spondylosis and degenerative disc disease.  IMPRESSION: 1. The 3.2 by 2.2 cm right upper lobe mass has low-grade metabolic activity, maximum SUV 4.3, favoring low-grade malignancy. No findings of distant metastatic spread. 2. Small focus of accentuated activity in the distal transverse colon, polyp versus focal physiologic activity. Correlate with the patient's colon screening histor the findings noted above.  Y in determining whether  colonoscopy is appropriate. 3. Other imaging findings of potential clinical significance: Aortic Atherosclerosis (ICD10-I70.0). Coronary atherosclerosis. Mild cardiomegaly. Small type 1 hiatal hernia. Moderate-sized umbilical hernia containing adipose tissue. Descending and sigmoid colon diverticulosis. Left renal atrophy. Probable mild punctate nonobstructive bilateral nephrolithiasis. Degenerative glenohumeral arthropathy bilaterally. Lumbar spondylosis and degenerative disc disease.   Electronically Signed   By: Van Clines M.D.   On: 01/25/2020 14:10 I personally reviewed the CT and PET/CT images and concur  with the findings noted above  Impression: Laura Walker is an 86 year old woman with a history of obesity, obstructive sleep apnea, hyperlipidemia, osteopenia, and dementia.  She was brought to the emergency room about a month ago after being found in the floor.  She was not unconscious.  Work-up showed a 3-1/2 cm spiculated right upper lobe lung mass highly suspicious for a primary bronchogenic carcinoma.  On PET CT that mass is hypermetabolic with an SUV of 4.3.  This most likely is a primary bronchogenic carcinoma and needs to be considered that unless it can be proven otherwise.  Infectious and inflammatory nodules are also in the differential but are less likely.  I had a long discussion with Laura Walker and her daughter.  I reviewed the CT and PET/CT images with them.  It is unclear how much of the conversation that Laura Walker was able to follow.    I discussed the possibility of doing a navigational bronchoscopy for diagnostic purposes.  They understand this would be an outpatient procedure, but done in the operating room under general anesthesia.  They understand this is generally a safe procedure but at her age there is always a risk for significant complications.  I informed them of the indications, risks, benefits, and alternatives.  They understand this would be  strictly diagnostic.  They understand the risks include, but are not limited to death, stroke, MI, blood clots, pneumothorax, bleeding, and failure to make a diagnosis.  They understand there is no guarantee of a definitive diagnosis although I think in all likelihood we should be able to get one based on the size and location.  We discussed issues around biopsying the lesion and wanting to make sure that she was interested in pursuing treatment before we did so.  She is not a surgical candidate, but could be treated with radiation.  She might also be a candidate for targeted therapy, although I do not think she would be a candidate for chemotherapy.  Although her daughter is interested in pursuing a diagnosis and treatment, Laura Walker has some reservations about that.  They are going to discuss this issue and also talked to her other children before making a final decision as to how they would like to proceed.  I did offer to arrange for them to talk to radiation oncology before the biopsy if they so desired.   Plan: Laura Walker and her daughter will discuss the issue and also speak with other family members. They will call if they would like to proceed with navigational bronchoscopy for diagnostic purposes.  Melrose Nakayama, MD [facility] [phone]

## 2020-01-27 ENCOUNTER — Other Ambulatory Visit: Payer: Self-pay | Admitting: *Deleted

## 2020-01-27 DIAGNOSIS — R918 Other nonspecific abnormal finding of lung field: Secondary | ICD-10-CM

## 2020-01-28 ENCOUNTER — Telehealth: Payer: Self-pay | Admitting: Internal Medicine

## 2020-01-28 ENCOUNTER — Telehealth: Payer: Self-pay | Admitting: *Deleted

## 2020-01-28 NOTE — Telephone Encounter (Signed)
R/s appt per 3/4 sch message - pt daughter is aware of appt date and time

## 2020-01-28 NOTE — Telephone Encounter (Signed)
Oncology Nurse Navigator Documentation  Oncology Nurse Navigator Flowsheets 01/28/2020  Abnormal Finding Date -  Diagnosis Status -  Navigator Follow Up Date: -  Navigator Follow Up Reason: -  Navigator Location [facility]  Referral Date to RadOnc/MedOnc -  Navigator Encounter Type Telephone/I followed up on Laura Walker and her schedule. She is set up for her bx and follow up with Dr. Julien Nordmann.  I called daughter Laura Walker to see if she had any questions. I was unable to reach but did leave vm message with my name and phone number to call.   Telephone Outgoing Call  [facility] Date -  Multidisiplinary Clinic Type -  Patient Visit Type -  Treatment Phase Abnormal Scans  Barriers/Navigation Needs Education  Education Other  Interventions Education  Acuity Level 2-Minimal Needs (1-2 Barriers Identified)  Coordination of Care -  Education Method Verbal  Time Spent with Patient 15

## 2020-01-28 NOTE — Progress Notes (Signed)
[place] [place] [address] Phone: [phone] Fax: [phone]      Your procedure is scheduled on February 02, 2020.  Report to [facility] Main Entrance "A" at 6:30 A.M., and check in at the Admitting office.  Call this number if you have problems the morning of surgery:  [phone]  Call [phone] if you have any questions prior to your surgery date Monday-Friday 8am-4pm    Remember:  Do not eat or drink after midnight the night before your surgery   Take these medicines the morning of surgery with A SIP OF WATER: DULoxetine (CYMBALTA) memantine (NAMENDA)  carboxymethylcellulose (REFRESH TEARS) - as needed Naphazoline-Pheniramine (OPCON-A) - as needed   As of today, STOP taking any Aspirin (unless otherwise instructed by your surgeon), Aleve, Naproxen, Ibuprofen, Motrin, Advil, Goody's, BC's, all herbal medications, fish oil, and all vitamins.    The Morning of Surgery  Do not wear jewelry, make-up or nail polish.  Do not wear lotions, powders, or perfumes or deodorant  Do not shave 48 hours prior to surgery.    Do not bring valuables to the hospital.  [facility] is not responsible for any belongings or valuables.  If you are a smoker, DO NOT Smoke 24 hours prior to surgery  If you wear a CPAP at night please bring your mask the morning of surgery   Remember that you must have someone to transport you home after your surgery, and remain with you for 24 hours if you are discharged the same day.   Please bring cases for contacts, glasses, hearing aids, dentures or bridgework because it cannot be worn into surgery.    Leave your suitcase in the car.  After surgery it may be brought to your room.  For patients admitted to the hospital, discharge time will be determined by your treatment team.  Patients discharged the day of surgery will not be allowed to drive home.    Special  instructions:   [place]- Preparing For Surgery  Before surgery, you can play an important role. Because skin is not sterile, your skin needs to be as free of germs as possible. You can reduce the number of germs on your skin by washing with CHG (chlorahexidine gluconate) Soap before surgery.  CHG is an antiseptic cleaner which kills germs and bonds with the skin to continue killing germs even after washing.    Oral Hygiene is also important to reduce your risk of infection.  Remember - BRUSH YOUR TEETH THE MORNING OF SURGERY WITH YOUR REGULAR TOOTHPASTE  Please do not use if you have an allergy to CHG or antibacterial soaps. If your skin becomes reddened/irritated stop using the CHG.  Do not shave (including legs and underarms) for at least 48 hours prior to first CHG shower. It is OK to shave your face.  Please follow these instructions carefully.   1. Shower the NIGHT BEFORE SURGERY and the MORNING OF SURGERY with CHG Soap.   2. If you chose to wash your hair, wash your hair first as usual with your normal shampoo.  3. After you shampoo, rinse your hair and body thoroughly to remove the shampoo.  4. Use CHG as you would any other liquid soap. You can apply CHG directly to the skin and wash gently with a scrungie or a clean washcloth.   5. Apply the CHG Soap to your body ONLY FROM THE NECK  DOWN.  Do not use on open wounds or open sores. Avoid contact with your eyes, ears, mouth and genitals (private parts). Wash Face and genitals (private parts)  with your normal soap.   6. Wash thoroughly, paying special attention to the area where your surgery will be performed.  7. Thoroughly rinse your body with warm water from the neck down.  8. DO NOT shower/wash with your normal soap after using and rinsing off the CHG Soap.  9. Pat yourself dry with a CLEAN TOWEL.  10. Wear CLEAN PAJAMAS to bed the night before surgery, wear comfortable clothes the morning of surgery  11. Place CLEAN  SHEETS on your bed the night of your first shower and DO NOT SLEEP WITH PETS.    Day of Surgery:  Please shower the morning of surgery with the CHG soap Do not apply any deodorants/lotions. Please wear clean clothes to the hospital/surgery center.   Remember to brush your teeth WITH YOUR REGULAR TOOTHPASTE.   Please read over the following fact sheets that you were given.

## 2020-01-30 DIAGNOSIS — I4891 Unspecified atrial fibrillation: Secondary | ICD-10-CM | POA: Diagnosis not present

## 2020-01-30 DIAGNOSIS — R918 Other nonspecific abnormal finding of lung field: Secondary | ICD-10-CM | POA: Diagnosis not present

## 2020-01-30 DIAGNOSIS — F0390 Unspecified dementia without behavioral disturbance: Secondary | ICD-10-CM | POA: Diagnosis not present

## 2020-01-30 DIAGNOSIS — E559 Vitamin D deficiency, unspecified: Secondary | ICD-10-CM | POA: Diagnosis not present

## 2020-01-30 DIAGNOSIS — H9190 Unspecified hearing loss, unspecified ear: Secondary | ICD-10-CM | POA: Diagnosis not present

## 2020-01-30 DIAGNOSIS — E7800 Pure hypercholesterolemia, unspecified: Secondary | ICD-10-CM | POA: Diagnosis not present

## 2020-01-30 DIAGNOSIS — M170 Bilateral primary osteoarthritis of knee: Secondary | ICD-10-CM | POA: Diagnosis not present

## 2020-01-30 DIAGNOSIS — Z87891 Personal history of nicotine dependence: Secondary | ICD-10-CM | POA: Diagnosis not present

## 2020-01-30 DIAGNOSIS — F329 Major depressive disorder, single episode, unspecified: Secondary | ICD-10-CM | POA: Diagnosis not present

## 2020-01-30 DIAGNOSIS — N183 Chronic kidney disease, stage 3 unspecified: Secondary | ICD-10-CM | POA: Diagnosis not present

## 2020-01-30 DIAGNOSIS — G4733 Obstructive sleep apnea (adult) (pediatric): Secondary | ICD-10-CM | POA: Diagnosis not present

## 2020-01-30 DIAGNOSIS — E211 Secondary hyperparathyroidism, not elsewhere classified: Secondary | ICD-10-CM | POA: Diagnosis not present

## 2020-01-30 DIAGNOSIS — R296 Repeated falls: Secondary | ICD-10-CM | POA: Diagnosis not present

## 2020-01-30 DIAGNOSIS — M8580 Other specified disorders of bone density and structure, unspecified site: Secondary | ICD-10-CM | POA: Diagnosis not present

## 2020-01-30 DIAGNOSIS — I129 Hypertensive chronic kidney disease with stage 1 through stage 4 chronic kidney disease, or unspecified chronic kidney disease: Secondary | ICD-10-CM | POA: Diagnosis not present

## 2020-01-30 DIAGNOSIS — Z9181 History of falling: Secondary | ICD-10-CM | POA: Diagnosis not present

## 2020-01-31 ENCOUNTER — Encounter (HOSPITAL_COMMUNITY): Payer: Self-pay

## 2020-01-31 ENCOUNTER — Other Ambulatory Visit: Payer: Self-pay

## 2020-01-31 ENCOUNTER — Encounter (HOSPITAL_COMMUNITY)
Admission: RE | Admit: 2020-01-31 | Discharge: 2020-01-31 | Disposition: A | Discharge status: Home / Self Care | Payer: PPO | Source: Ambulatory Visit | Attending: Thoracic Surgery (Cardiothoracic Vascular Surgery) | Admitting: Thoracic Surgery (Cardiothoracic Vascular Surgery)

## 2020-01-31 ENCOUNTER — Ambulatory Visit (HOSPITAL_COMMUNITY)
Admission: RE | Admit: 2020-01-31 | Discharge: 2020-01-31 | Disposition: A | Discharge status: Home / Self Care | Payer: PPO | Source: Ambulatory Visit | Attending: Thoracic Surgery (Cardiothoracic Vascular Surgery) | Admitting: Thoracic Surgery (Cardiothoracic Vascular Surgery)

## 2020-01-31 ENCOUNTER — Other Ambulatory Visit (HOSPITAL_COMMUNITY)
Admission: RE | Admit: 2020-01-31 | Discharge: 2020-01-31 | Disposition: A | Discharge status: Home / Self Care | Payer: PPO | Source: Ambulatory Visit | Attending: Thoracic Surgery (Cardiothoracic Vascular Surgery) | Admitting: Thoracic Surgery (Cardiothoracic Vascular Surgery)

## 2020-01-31 DIAGNOSIS — E669 Obesity, unspecified: Secondary | ICD-10-CM | POA: Insufficient documentation

## 2020-01-31 DIAGNOSIS — Z01812 Encounter for preprocedural laboratory examination: Secondary | ICD-10-CM | POA: Diagnosis not present

## 2020-01-31 DIAGNOSIS — Z6839 Body mass index (BMI) 39.0-39.9, adult: Secondary | ICD-10-CM | POA: Insufficient documentation

## 2020-01-31 DIAGNOSIS — Z7401 Bed confinement status: Secondary | ICD-10-CM | POA: Diagnosis not present

## 2020-01-31 DIAGNOSIS — F329 Major depressive disorder, single episode, unspecified: Secondary | ICD-10-CM | POA: Insufficient documentation

## 2020-01-31 DIAGNOSIS — J9811 Atelectasis: Secondary | ICD-10-CM | POA: Diagnosis not present

## 2020-01-31 DIAGNOSIS — R279 Unspecified lack of coordination: Secondary | ICD-10-CM | POA: Diagnosis not present

## 2020-01-31 DIAGNOSIS — R918 Other nonspecific abnormal finding of lung field: Secondary | ICD-10-CM | POA: Insufficient documentation

## 2020-01-31 DIAGNOSIS — Z20822 Contact with and (suspected) exposure to covid-19: Secondary | ICD-10-CM | POA: Insufficient documentation

## 2020-01-31 DIAGNOSIS — F419 Anxiety disorder, unspecified: Secondary | ICD-10-CM | POA: Insufficient documentation

## 2020-01-31 DIAGNOSIS — N189 Chronic kidney disease, unspecified: Secondary | ICD-10-CM | POA: Insufficient documentation

## 2020-01-31 DIAGNOSIS — Z79899 Other long term (current) drug therapy: Secondary | ICD-10-CM | POA: Insufficient documentation

## 2020-01-31 DIAGNOSIS — Z791 Long term (current) use of non-steroidal anti-inflammatories (NSAID): Secondary | ICD-10-CM | POA: Insufficient documentation

## 2020-01-31 DIAGNOSIS — Z87891 Personal history of nicotine dependence: Secondary | ICD-10-CM | POA: Insufficient documentation

## 2020-01-31 DIAGNOSIS — E7800 Pure hypercholesterolemia, unspecified: Secondary | ICD-10-CM | POA: Diagnosis not present

## 2020-01-31 DIAGNOSIS — G4733 Obstructive sleep apnea (adult) (pediatric): Secondary | ICD-10-CM | POA: Diagnosis not present

## 2020-01-31 HISTORY — DX: Depression, unspecified: F32.A

## 2020-01-31 HISTORY — DX: Unspecified dementia, unspecified severity, without behavioral disturbance, psychotic disturbance, mood disturbance, and anxiety: F03.90

## 2020-01-31 HISTORY — DX: Unspecified osteoarthritis, unspecified site: M19.90

## 2020-01-31 HISTORY — DX: Cardiac arrhythmia, unspecified: I49.9

## 2020-01-31 HISTORY — DX: Anxiety disorder, unspecified: F41.9

## 2020-01-31 LAB — CBC
HCT: 43.6 % (ref 36.0–46.0)
Hemoglobin: 13.5 g/dL (ref 12.0–15.0)
MCH: 28.5 pg (ref 26.0–34.0)
MCHC: 31 g/dL (ref 30.0–36.0)
MCV: 92 fL (ref 80.0–100.0)
Platelets: 251 10*3/uL (ref 150–400)
RBC: 4.74 MIL/uL (ref 3.87–5.11)
RDW: 14 % (ref 11.5–15.5)
WBC: 8.6 10*3/uL (ref 4.0–10.5)
nRBC: 0 % (ref 0.0–0.2)

## 2020-01-31 LAB — COMPREHENSIVE METABOLIC PANEL
ALT: 13 U/L (ref 0–44)
AST: 12 U/L — ABNORMAL LOW (ref 15–41)
Albumin: 3.4 g/dL — ABNORMAL LOW (ref 3.5–5.0)
Alkaline Phosphatase: 72 U/L (ref 38–126)
Anion gap: 8 (ref 5–15)
BUN: 24 mg/dL — ABNORMAL HIGH (ref 8–23)
CO2: 25 mmol/L (ref 22–32)
Calcium: 9.6 mg/dL (ref 8.9–10.3)
Chloride: 105 mmol/L (ref 98–111)
Creatinine, Ser: 0.91 mg/dL (ref 0.44–1.00)
GFR calc Af Amer: >60 mL/min (ref >60)
GFR calc non Af Amer: 57 mL/min — ABNORMAL LOW (ref >60)
Glucose, Bld: 104 mg/dL — ABNORMAL HIGH (ref 70–99)
Potassium: 4.3 mmol/L (ref 3.5–5.1)
Sodium: 138 mmol/L (ref 135–145)
Total Bilirubin: 0.9 mg/dL (ref 0.3–1.2)
Total Protein: 6.1 g/dL — ABNORMAL LOW (ref 6.5–8.1)

## 2020-01-31 LAB — APTT: aPTT: 28 seconds (ref 24–36)

## 2020-01-31 LAB — PROTIME-INR
INR: 1 (ref 0.8–1.2)
Prothrombin Time: 13.2 seconds (ref 11.4–15.2)

## 2020-01-31 LAB — SARS CORONAVIRUS 2 (TAT 6-24 HRS): SARS Coronavirus 2: NEGATIVE

## 2020-01-31 NOTE — Progress Notes (Signed)
PCP - Dr. [name] Cardiologist - denies  PPM/ICD - N/A Device Orders -N/A  Rep Notified - N/A  Chest x-ray - 01/31/20 EKG - 01/06/20 Stress Test - denies ECHO - denies Cardiac Cath - denies  Sleep Study - OSA+ CPAP - uses nightly   Blood Thinner Instructions: N/A Aspirin Instructions:N/A  ERAS Protcol -N/A PRE-SURGERY Ensure or G2- N/A  COVID TEST- Scheduled for today after PAT appointment. Pt aware of quarantine instructions.    Pt was brought in by EMS d/t daughter being unable to transport her alone. Pt transferred to wheelchair upon arrival to admitting office. Pt's daughter stated that this will be the plan for DOS. Will make flow coordinator, Lindsi aware. Pt's daughter is capable of transporting pt home after surgery. Pt has a caregiver at home that will assist daughter with getting patient in the home.   Anesthesia review: Yes, EKG review.   Patient denies shortness of breath, fever, cough and chest pain at PAT appointment   All instructions explained to the patient, with a verbal understanding of the material. Patient agrees to go over the instructions while at home for a better understanding. Patient also instructed to self quarantine after being tested for COVID-19. The opportunity to ask questions was provided.    Coronavirus Screening  Have you experienced the following symptoms:  Cough yes/no: No Fever (>100.45F)  yes/no: No Runny nose yes/no: No Sore throat yes/no: No Difficulty breathing/shortness of breath  yes/no: No  Have you or a family member traveled in the last 14 days and where? yes/no: No   If the patient indicates "YES" to the above questions, their PAT will be rescheduled to limit the exposure to others and, the surgeon will be notified. THE PATIENT WILL NEED TO BE ASYMPTOMATIC FOR 14 DAYS.   If the patient is not experiencing any of these symptoms, the PAT nurse will instruct them to NOT bring anyone with them to their appointment since  they may have these symptoms or traveled as well.   Please remind your patients and families that hospital visitation restrictions are in effect and the importance of the restrictions.

## 2020-02-01 ENCOUNTER — Ambulatory Visit: Payer: PPO | Admitting: Internal Medicine

## 2020-02-01 NOTE — Progress Notes (Signed)
Anesthesia Chart Review:  Case: [id number] Date/Time: 02/02/20 0845   Procedure: VIDEO BRONCHOSCOPY WITH ENDOBRONCHIAL NAVIGATION (N/A )   Anesthesia type: General   Pre-op diagnosis: lung mass   Location: [facility] OR ROOM 10 / [place] [facility]   Surgeons: Melrose Nakayama, MD      DISCUSSION: Patient is an 86 year old female scheduled for the above procedure.  History includes former smoker (quit 12/13/84), afib (new finding 01/06/20), hypercholesterolemia, CKD, OSA (CPAP use), anxiety, dementia. BMI is consistent with obesity.   ED visit 01/06/20 with AMS and fall. She lives with her daughter but has a [facility] aide during the day. She has known dementia. Also with report of malodorous urine but with negative leukocytes, nitrites on UA. CXR showed RUL opacity, infection versus mass. Chest CT showed RUL mass suspicious for lung neoplasm. Head CT showed possible remote lacunar infarct but no acute findings. EKG showed rate controlled afib without known prior history.  There was mention of repeating EKG but no additional tracing noted. She was discharged home with thoracic oncology follow-up.  I contacted Dr. Stephanie Acre' office. Virtual visit for ED follow-up 01/11/20. She notes new afib, possible old lacunar infarct, right lung mass, and HH needs for physical debility (for bed and wheelchair). She held off on anticoagulation as well as echo pending HEM-ONC recommendations since biopsy may be warranted. However, after discussion with several anesthesiologists including Oren Bracket, MD, recommendation is for preoperative cardiology evaluation. (Dr. Roxan Hockey classified Zubrod score of 3 (only limited self care, in bed > 50% of waking hours.).   01/31/20 presurgical COVID-19 test negative. Patient is arriving by ambulance transport. Her daughter Luciana Axe ([phone]) can be contacted to answer questions prior to surgery.   [facility] staff aware of need for preoperative cardiology evaluation. Given her transportation  issues (intermittent need for ambulance transfers), Dr. Blase Mess has requested cardiology to see patient when she arrives for surgery. Will go ahead a order a repeat EKG on her arrival (to evaluate for persistent afib), so it is available for cardiology to review. Trish with [facility] reported that a cardiology provider will see patient asap after 7:30 AM. Dr. Roxan Hockey aware that case may start late--pending cardiology input.    VS: BP 113/75   Pulse 87   Temp 36.9 C (Oral)   Resp 20   Ht 5\' 3"  (1.6 m)   Wt 102.1 kg   SpO2 100%   BMI 39.86 kg/m    PROVIDERS: [name], MD is PCP [facility]) Curt Bears, MD is HEM-ONC   LABS: Labs reviewed: Acceptable for surgery. (all labs ordered are listed, but only abnormal results are displayed)  Labs Reviewed  COMPREHENSIVE METABOLIC PANEL - Abnormal; Notable for the following components:      Result Value   Glucose, Bld 104 (*)    BUN 24 (*)    Total Protein 6.1 (*)    Albumin 3.4 (*)    AST 12 (*)    GFR calc non Af Amer 57 (*)    All other components within normal limits  APTT  CBC  PROTIME-INR     IMAGES: PET Scan 01/25/20: IMPRESSION: 1. The 3.2 by 2.2 cm right upper lobe mass has low-grade metabolic activity, maximum SUV 4.3, favoring low-grade malignancy. No findings of distant metastatic spread. 2. Small focus of accentuated activity in the distal transverse colon, polyp versus focal physiologic activity. Correlate with the patient's colon screening history in determining whether colonoscopy is appropriate. 3. Other imaging findings of potential  clinical significance: Aortic Atherosclerosis (ICD10-I70.0). Coronary atherosclerosis. Mild cardiomegaly. Small type 1 hiatal hernia. Moderate-sized umbilical hernia containing adipose tissue. Descending and sigmoid colon diverticulosis. Left renal atrophy. Probable mild punctate nonobstructive bilateral nephrolithiasis. Degenerative  glenohumeral arthropathy bilaterally. Lumbar spondylosis and degenerative disc disease.  CT Chest 01/06/20: IMPRESSION: 1. 3.6 x 3.2 x 3.1 cm right upper lobe lung mass most consistent with primary lung neoplasm. Recommend referral to multi-disciplinary thoracic oncology clinic [facility]). Patient will likely need PET-CT for further evaluation and staging. 2. No mediastinal or hilar mass or adenopathy. 3. No findings for upper abdominal metastatic disease. 4. Age related atherosclerotic calcifications involving the thoracic and abdominal aorta and branch vessels including the coronary arteries.  CT Head 01/06/20: IMPRESSION: 1. No acute intracranial abnormality. No skull fracture. 2. Age related atrophy.  Advanced chronic small vessel ischemia. 3. Remote lacunar infarct versus prominent perivascular space in the left thalamus.   EKG:  EKG 01/06/20: Atrial fibrillation at 78 bpm Nonspecific intraventricular conduction delay Probable anterior infarct, age undetermined Baseline wander in lead(s) V5 No old tracing to compare Confirmed by Isla Pence [phone]) on 01/06/2020 10:18:00 AM - There are no comparison EKG tracing in [facility], [facility], or at Dr. Stephanie Acre' office  CV: Denied prior stress, echo, cath.   Past Medical History:  Diagnosis Date  . Anxiety   . Arthritis   . CKD (chronic kidney disease)   . Dementia ([place])   . Depression   . Dysrhythmia    new onset AFIB.   Marland Kitchen Hypercholesteremia   . Memory loss   . OSA (obstructive sleep apnea)    CPAP  . Osteopenia     Past Surgical History:  Procedure Laterality Date  . ANKLE SURGERY Left    as child, fracture  . BUNIONECTOMY      MEDICATIONS: . carboxymethylcellulose (REFRESH TEARS) 0.5 % SOLN  . Cholecalciferol (VITAMIN D) 50 MCG (2000 UT) tablet  . ciclopirox (PENLAC) 8 % solution  . clotrimazole-betamethasone (LOTRISONE) cream  . donepezil (ARICEPT) 10 MG tablet  . DULoxetine (CYMBALTA) 30 MG capsule  . meloxicam  (MOBIC) 7.5 MG tablet  . memantine (NAMENDA) 10 MG tablet  . Naphazoline-Pheniramine (OPCON-A) 0.027-0.315 % SOLN  . Omega-3 Fatty Acids (FISH OIL ULTRA) 1400 MG CAPS  . oxybutynin (DITROPAN-XL) 5 MG 24 hr tablet  . Red Yeast Rice 600 MG CAPS  . triamcinolone cream (KENALOG) 0.1 %  . vitamin B-12 (CYANOCOBALAMIN) 1000 MCG tablet   No current facility-administered medications for this encounter.    Myra Gianotti, PA-C Surgical Short Stay/Anesthesiology [facility] Phone [phone] [facility] Phone [phone] 02/01/2020 4:38 PM

## 2020-02-01 NOTE — Anesthesia Preprocedure Evaluation (Addendum)
Anesthesia Evaluation  Patient identified by MRN, date of birth, ID band Patient awake    Reviewed: Allergy & Precautions, NPO status , Patient's Chart, lab work & pertinent test results  Airway Mallampati: II  TM Distance: >3 FB Neck ROM: Full    Dental no notable dental hx.    Pulmonary sleep apnea , former smoker,  Lung mass Quit smoking 1986   Pulmonary exam normal breath sounds clear to auscultation       Cardiovascular Normal cardiovascular exam+ dysrhythmias Atrial Fibrillation  Rhythm:Regular Rate:Normal  HLD   Neuro/Psych PSYCHIATRIC DISORDERS Anxiety Depression Dementia negative neurological ROS     GI/Hepatic negative GI ROS, Neg liver ROS,   Endo/Other  Morbid obesityBMI 40  Renal/GU Renal InsufficiencyRenal disease  negative genitourinary   Musculoskeletal  (+) Arthritis , Osteoarthritis,    Abdominal   Peds  Hematology negative hematology ROS (+)   Anesthesia Other Findings Day of surgery medications reviewed with the patient.  Reproductive/Obstetrics negative OB ROS                            Anesthesia Physical Anesthesia Plan  ASA: III  Anesthesia Plan: General   Post-op Pain Management:    Induction: Intravenous  PONV Risk Score and Plan: 3 and Ondansetron, Dexamethasone and Treatment may vary due to age or medical condition  Airway Management Planned: Oral ETT  Additional Equipment: None  Intra-op Plan:   Post-operative Plan: Extubation in OR  Informed Consent: I have reviewed the patients History and Physical, chart, labs and discussed the procedure including the risks, benefits and alternatives for the proposed anesthesia with the patient or authorized representative who has indicated his/her understanding and acceptance.     Dental advisory given  Plan Discussed with: CRNA  Anesthesia Plan Comments: ( )       Anesthesia Quick Evaluation

## 2020-02-02 ENCOUNTER — Encounter (HOSPITAL_COMMUNITY): Payer: Self-pay | Admitting: Thoracic Surgery (Cardiothoracic Vascular Surgery)

## 2020-02-02 ENCOUNTER — Ambulatory Visit (HOSPITAL_COMMUNITY)
Admission: RE | Admit: 2020-02-02 | Discharge: 2020-02-02 | Disposition: A | Discharge status: Home / Self Care | Payer: PPO | Attending: Thoracic Surgery (Cardiothoracic Vascular Surgery) | Admitting: Thoracic Surgery (Cardiothoracic Vascular Surgery)

## 2020-02-02 ENCOUNTER — Ambulatory Visit (HOSPITAL_COMMUNITY): Payer: PPO

## 2020-02-02 ENCOUNTER — Inpatient Hospital Stay: Payer: PPO | Admitting: Internal Medicine

## 2020-02-02 ENCOUNTER — Other Ambulatory Visit: Payer: Self-pay

## 2020-02-02 ENCOUNTER — Ambulatory Visit (HOSPITAL_COMMUNITY): Payer: PPO | Admitting: Vascular Surgery

## 2020-02-02 ENCOUNTER — Ambulatory Visit (HOSPITAL_COMMUNITY): Payer: PPO | Admitting: Physician Assistant

## 2020-02-02 ENCOUNTER — Encounter (HOSPITAL_COMMUNITY)
Admission: RE | Disposition: A | Discharge status: Home / Self Care | Payer: Self-pay | Source: Home / Self Care | Attending: Thoracic Surgery (Cardiothoracic Vascular Surgery)

## 2020-02-02 DIAGNOSIS — R918 Other nonspecific abnormal finding of lung field: Secondary | ICD-10-CM

## 2020-02-02 DIAGNOSIS — I447 Left bundle-branch block, unspecified: Secondary | ICD-10-CM | POA: Diagnosis present

## 2020-02-02 DIAGNOSIS — Z01810 Encounter for preprocedural cardiovascular examination: Secondary | ICD-10-CM | POA: Diagnosis not present

## 2020-02-02 DIAGNOSIS — G4733 Obstructive sleep apnea (adult) (pediatric): Secondary | ICD-10-CM | POA: Diagnosis not present

## 2020-02-02 DIAGNOSIS — Z87891 Personal history of nicotine dependence: Secondary | ICD-10-CM | POA: Insufficient documentation

## 2020-02-02 DIAGNOSIS — Z79899 Other long term (current) drug therapy: Secondary | ICD-10-CM | POA: Insufficient documentation

## 2020-02-02 DIAGNOSIS — I4819 Other persistent atrial fibrillation: Secondary | ICD-10-CM | POA: Diagnosis not present

## 2020-02-02 DIAGNOSIS — E7800 Pure hypercholesterolemia, unspecified: Secondary | ICD-10-CM | POA: Diagnosis not present

## 2020-02-02 DIAGNOSIS — I4891 Unspecified atrial fibrillation: Secondary | ICD-10-CM | POA: Diagnosis not present

## 2020-02-02 DIAGNOSIS — Z6839 Body mass index (BMI) 39.0-39.9, adult: Secondary | ICD-10-CM | POA: Diagnosis not present

## 2020-02-02 DIAGNOSIS — C3411 Malignant neoplasm of upper lobe, right bronchus or lung: Secondary | ICD-10-CM | POA: Diagnosis not present

## 2020-02-02 DIAGNOSIS — N183 Chronic kidney disease, stage 3 unspecified: Secondary | ICD-10-CM | POA: Diagnosis not present

## 2020-02-02 DIAGNOSIS — F0390 Unspecified dementia without behavioral disturbance: Secondary | ICD-10-CM | POA: Diagnosis not present

## 2020-02-02 DIAGNOSIS — F329 Major depressive disorder, single episode, unspecified: Secondary | ICD-10-CM | POA: Insufficient documentation

## 2020-02-02 DIAGNOSIS — I480 Paroxysmal atrial fibrillation: Secondary | ICD-10-CM | POA: Diagnosis not present

## 2020-02-02 DIAGNOSIS — F418 Other specified anxiety disorders: Secondary | ICD-10-CM | POA: Diagnosis not present

## 2020-02-02 DIAGNOSIS — Z419 Encounter for procedure for purposes other than remedying health state, unspecified: Secondary | ICD-10-CM

## 2020-02-02 DIAGNOSIS — Z801 Family history of malignant neoplasm of trachea, bronchus and lung: Secondary | ICD-10-CM | POA: Insufficient documentation

## 2020-02-02 DIAGNOSIS — F419 Anxiety disorder, unspecified: Secondary | ICD-10-CM | POA: Diagnosis not present

## 2020-02-02 HISTORY — PX: VIDEO BRONCHOSCOPY WITH ENDOBRONCHIAL NAVIGATION: SHX6175

## 2020-02-02 SURGERY — VIDEO BRONCHOSCOPY WITH ENDOBRONCHIAL NAVIGATION
Anesthesia: General

## 2020-02-02 MED ORDER — DEXAMETHASONE SODIUM PHOSPHATE 10 MG/ML IJ SOLN
INTRAMUSCULAR | Status: AC
  Filled 2020-02-02: qty 1

## 2020-02-02 MED ORDER — ESMOLOL HCL 100 MG/10ML IV SOLN
INTRAVENOUS | Status: DC | PRN
  Administered 2020-02-02 (×4): 10 mg via INTRAVENOUS
  Administered 2020-02-02: 20 mg via INTRAVENOUS

## 2020-02-02 MED ORDER — FENTANYL CITRATE (PF) 100 MCG/2ML IJ SOLN
INTRAMUSCULAR | Status: DC | PRN
  Administered 2020-02-02 (×2): 50 ug via INTRAVENOUS

## 2020-02-02 MED ORDER — FENTANYL CITRATE (PF) 100 MCG/2ML IJ SOLN: 25.0000 ug | INTRAMUSCULAR | Status: DC | PRN

## 2020-02-02 MED ORDER — DEXAMETHASONE SODIUM PHOSPHATE 10 MG/ML IJ SOLN
INTRAMUSCULAR | Status: DC | PRN
  Administered 2020-02-02: 5 mg via INTRAVENOUS

## 2020-02-02 MED ORDER — FENTANYL CITRATE (PF) 250 MCG/5ML IJ SOLN
INTRAMUSCULAR | Status: AC
  Filled 2020-02-02: qty 5

## 2020-02-02 MED ORDER — ROCURONIUM BROMIDE 10 MG/ML (PF) SYRINGE
PREFILLED_SYRINGE | INTRAVENOUS | Status: DC | PRN
  Administered 2020-02-02: 20 mg via INTRAVENOUS
  Administered 2020-02-02: 50 mg via INTRAVENOUS

## 2020-02-02 MED ORDER — 0.9 % SODIUM CHLORIDE (POUR BTL) OPTIME
TOPICAL | Status: DC | PRN
  Administered 2020-02-02: 1000 mL

## 2020-02-02 MED ORDER — METOPROLOL TARTRATE 5 MG/5ML IV SOLN
INTRAVENOUS | Status: DC | PRN
  Administered 2020-02-02: 2 mg via INTRAVENOUS

## 2020-02-02 MED ORDER — SODIUM CHLORIDE 0.9 % IV SOLN: INTRAVENOUS | Status: DC | PRN

## 2020-02-02 MED ORDER — METOPROLOL TARTRATE 5 MG/5ML IV SOLN
INTRAVENOUS | Status: AC
  Filled 2020-02-02: qty 5

## 2020-02-02 MED ORDER — ONDANSETRON HCL 4 MG/2ML IJ SOLN
INTRAMUSCULAR | Status: AC
  Filled 2020-02-02: qty 2

## 2020-02-02 MED ORDER — PHENYLEPHRINE 40 MCG/ML (10ML) SYRINGE FOR IV PUSH (FOR BLOOD PRESSURE SUPPORT)
PREFILLED_SYRINGE | INTRAVENOUS | Status: DC | PRN
  Administered 2020-02-02: 120 ug via INTRAVENOUS

## 2020-02-02 MED ORDER — PROPOFOL 10 MG/ML IV BOLUS
INTRAVENOUS | Status: AC
  Filled 2020-02-02: qty 20

## 2020-02-02 MED ORDER — LIDOCAINE 2% (20 MG/ML) 5 ML SYRINGE
INTRAMUSCULAR | Status: AC
  Filled 2020-02-02: qty 5

## 2020-02-02 MED ORDER — DEXMEDETOMIDINE HCL 200 MCG/2ML IV SOLN
INTRAVENOUS | Status: DC | PRN
  Administered 2020-02-02: 8 ug via INTRAVENOUS

## 2020-02-02 MED ORDER — ESMOLOL HCL 100 MG/10ML IV SOLN
INTRAVENOUS | Status: AC
  Filled 2020-02-02: qty 10

## 2020-02-02 MED ORDER — SUGAMMADEX SODIUM 200 MG/2ML IV SOLN
INTRAVENOUS | Status: DC | PRN
  Administered 2020-02-02: 200 mg via INTRAVENOUS

## 2020-02-02 MED ORDER — PHENYLEPHRINE HCL-NACL 10-0.9 MG/250ML-% IV SOLN
INTRAVENOUS | Status: DC | PRN
  Administered 2020-02-02: 25 ug/min via INTRAVENOUS

## 2020-02-02 MED ORDER — LIDOCAINE 2% (20 MG/ML) 5 ML SYRINGE
INTRAMUSCULAR | Status: DC | PRN
  Administered 2020-02-02: 60 mg via INTRAVENOUS

## 2020-02-02 MED ORDER — DEXMEDETOMIDINE HCL IN NACL 200 MCG/50ML IV SOLN
INTRAVENOUS | Status: AC
  Filled 2020-02-02: qty 50

## 2020-02-02 MED ORDER — LACTATED RINGERS IV SOLN: INTRAVENOUS | Status: DC

## 2020-02-02 MED ORDER — PROPOFOL 10 MG/ML IV BOLUS
INTRAVENOUS | Status: DC | PRN
  Administered 2020-02-02: 100 mg via INTRAVENOUS

## 2020-02-02 MED ORDER — ONDANSETRON HCL 4 MG/2ML IJ SOLN
4.0000 mg | Freq: Once | INTRAMUSCULAR | Status: AC | PRN
  Administered 2020-02-02: 4 mg via INTRAVENOUS

## 2020-02-02 MED ORDER — ROCURONIUM BROMIDE 10 MG/ML (PF) SYRINGE
PREFILLED_SYRINGE | INTRAVENOUS | Status: AC
  Filled 2020-02-02: qty 10

## 2020-02-02 SURGICAL SUPPLY — 41 items
ADAPTER BRONCHOSCOPE OLYMPUS (ADAPTER) ×2 IMPLANT
ADAPTER VALVE BIOPSY EBUS (MISCELLANEOUS) IMPLANT
ADPTR VALVE BIOPSY EBUS (MISCELLANEOUS)
BLADE CLIPPER SURG (BLADE) ×1 IMPLANT
BRUSH BIOPSY BRONCH 10 SDTNB (MISCELLANEOUS) IMPLANT
BRUSH SUPERTRAX BIOPSY (INSTRUMENTS) IMPLANT
BRUSH SUPERTRAX NDL-TIP CYTO (INSTRUMENTS) ×3 IMPLANT
CANISTER SUCT 3000ML PPV (MISCELLANEOUS) ×2 IMPLANT
CNTNR URN SCR LID CUP LEK RST (MISCELLANEOUS) ×2 IMPLANT
CONT SPEC 4OZ STRL OR WHT (MISCELLANEOUS) ×3
COVER BACK TABLE 60X90IN (DRAPES) ×2 IMPLANT
FILTER STRAW FLUID ASPIR (MISCELLANEOUS) IMPLANT
FORCEPS BIOP SUPERTRX PREMAR (INSTRUMENTS) ×1 IMPLANT
GAUZE SPONGE 4X4 12PLY STRL (GAUZE/BANDAGES/DRESSINGS) ×2 IMPLANT
GLOVE SURG SIGNA 7.5 PF LTX (GLOVE) ×2 IMPLANT
GOWN STRL REUS W/ TWL XL LVL3 (GOWN DISPOSABLE) ×1 IMPLANT
GOWN STRL REUS W/TWL XL LVL3 (GOWN DISPOSABLE) ×1
KIT CLEAN ENDO COMPLIANCE (KITS) ×2 IMPLANT
KIT ILLUMISITE 180 PROCEDURE (KITS) ×1 IMPLANT
KIT ILLUMISITE 90 PROCEDURE (KITS) IMPLANT
KIT TURNOVER KIT B (KITS) ×2 IMPLANT
MARKER SKIN DUAL TIP RULER LAB (MISCELLANEOUS) ×2 IMPLANT
NDL SUPERTRX PREMARK BIOPSY (NEEDLE) IMPLANT
NEEDLE SUPERTRX PREMARK BIOPSY (NEEDLE) IMPLANT
NS IRRIG 1000ML POUR BTL (IV SOLUTION) ×2 IMPLANT
OIL SILICONE PENTAX (PARTS (SERVICE/REPAIRS)) ×2 IMPLANT
PAD ARMBOARD 7.5X6 YLW CONV (MISCELLANEOUS) ×4 IMPLANT
PATCHES PATIENT (LABEL) ×6 IMPLANT
SYR 20ML ECCENTRIC (SYRINGE) ×2 IMPLANT
SYR 20ML LL LF (SYRINGE) ×2 IMPLANT
SYR 30ML LL (SYRINGE) ×2 IMPLANT
SYR 5ML LL (SYRINGE) ×2 IMPLANT
TOWEL GREEN STERILE (TOWEL DISPOSABLE) ×2 IMPLANT
TOWEL GREEN STERILE FF (TOWEL DISPOSABLE) ×2 IMPLANT
TRAP SPECIMEN MUCOUS 40CC (MISCELLANEOUS) ×2 IMPLANT
TUBE CONNECTING 20X1/4 (TUBING) ×4 IMPLANT
UNDERPAD 30X30 (UNDERPADS AND DIAPERS) ×2 IMPLANT
VALVE BIOPSY  SINGLE USE (MISCELLANEOUS) ×1
VALVE BIOPSY SINGLE USE (MISCELLANEOUS) ×1 IMPLANT
VALVE SUCTION BRONCHIO DISP (MISCELLANEOUS) ×2 IMPLANT
WATER STERILE IRR 1000ML POUR (IV SOLUTION) ×2 IMPLANT

## 2020-02-02 NOTE — Discharge Instructions (Signed)
You should rest and avoid heavy exertion today. You may resume normal activities tomorrow.  You may cough up small amounts of blood over the next few days. Call if you cough up more than 2 tablespoons of blood  You may use acetaminophen (Tylenol) if needed for discomfort  You may use an over the counter cough medication and/ or throat lozenge as needed  Call [phone] if you develop chest pain, shortness of breath, fever > 101 F or cough up more than 2 tbsp of blood  Follow up as scheduled with Dr. Julien Nordmann and Cardiology

## 2020-02-02 NOTE — Interval H&P Note (Signed)
History and Physical Interval Note:  02/02/2020 8:34 AM  Laura Walker  has presented today for surgery, with the diagnosis of right upper lobe lung mass.  The various methods of treatment have been discussed with the patient and family. After consideration of risks, benefits and other options for treatment, the patient has consented to  Procedure(s): [place] (N/A) as a surgical intervention.  The patient's history has been reviewed, patient examined, no change in status, stable for surgery.  I have reviewed the patient's chart and labs.  Questions were answered to the patient's satisfaction.    To have preop Cardiology evaluation  Melrose Nakayama

## 2020-02-02 NOTE — Transfer of Care (Signed)
Immediate Anesthesia Transfer of Care Note  Patient: Laura Walker  Procedure(s) Performed: VIDEO BRONCHOSCOPY WITH ENDOBRONCHIAL NAVIGATION (N/A )  Patient Location: PACU  Anesthesia Type:General  Level of Consciousness: awake, alert  and oriented  Airway & Oxygen Therapy: Patient Spontanous Breathing and Patient connected to face mask oxygen  Post-op Assessment: Report given to RN, Post -op Vital signs reviewed and stable and Patient moving all extremities X 4  Post vital signs: Reviewed and stable  Last Vitals:  Vitals Value Taken Time  BP 115/76 02/02/20 1025  Temp    Pulse 82 02/02/20 1026  Resp 17 02/02/20 1026  SpO2 99 % 02/02/20 1026  Vitals shown include unvalidated device data.  Last Pain:  Vitals:   02/02/20 0705  PainSc: 0-No pain         Complications: No apparent anesthesia complications

## 2020-02-02 NOTE — Anesthesia Procedure Notes (Signed)
Procedure Name: Intubation Date/Time: 02/02/2020 9:21 AM Performed by: Kyung Rudd, CRNA Pre-anesthesia Checklist: Patient identified, Emergency Drugs available, Suction available and Patient being monitored Patient Re-evaluated:Patient Re-evaluated prior to induction Oxygen Delivery Method: Circle system utilized Preoxygenation: Pre-oxygenation with 100% oxygen Induction Type: IV induction Ventilation: Mask ventilation without difficulty and Oral airway inserted - appropriate to patient size Laryngoscope Size: Mac and 3 Grade View: Grade III Tube type: Oral Tube size: 8.5 mm Number of attempts: 1 Airway Equipment and Method: Stylet Placement Confirmation: ETT inserted through vocal cords under direct vision,  positive ETCO2 and breath sounds checked- equal and bilateral Secured at: 20 cm Tube secured with: Tape Dental Injury: Teeth and Oropharynx as per pre-operative assessment

## 2020-02-02 NOTE — Op Note (Signed)
NAME: Laura Walker, Laura Walker MEDICAL RECORD [id number] ACCOUNT [id number] DATE OF BIRTH:09-14-1930 FACILITY: [facility] LOCATION: [facility] PHYSICIAN:Prabhjot Piscitello C. Mouna Yager, MD  OPERATIVE REPORT  DATE OF PROCEDURE:  02/02/2020  PREOPERATIVE DIAGNOSIS:  Right upper lobe mass, suspected non-small cell lung cancer.  POSTOPERATIVE DIAGNOSIS:  Nonsmall cell carcinoma, right upper lobe, clinical stage IB (T2, N0).  PROCEDURE:  Electromagnetic navigational bronchoscopy with brushings and transbronchial biopsies.  SURGEON:  Modesto Charon, MD  ASSISTANT:  None  ANESTHESIA:  General.  FINDINGS:  Brushings showed nonsmall-cell carcinoma.  CLINICAL NOTE:  [name] is an 86 year old woman recently found to have a 3 cm right upper lobe lung mass.  She and her family wished to pursue aggressive therapy.  She was referred for bronchoscopy for diagnostic purposes.  The indications, risks,  benefits, and alternatives were discussed in detail with the patient.  She understood and accepted the risks and agreed to proceed.  DESCRIPTION OF PROCEDURE:  [name] was brought to the operating room on 02/02/2020.  She had induction of general anesthesia and was intubated.  Planning and mapping for the navigational bronchoscopy was done prior to the patient coming into the  room.  Timeout was performed.  Flexible fiberoptic bronchoscopy then was performed via the endotracheal tube.  There were no endobronchial lesions to the level of the subsegmental bronchi.  Locatable guide for navigation was placed.  Registration was performed.  Bronchoscope then was directed to the right upper lobe bronchus and the appropriate subsegmental bronchus was cannulated.  The catheter was advanced within a centimeter of the  center of the lesion, but initially was slightly off axis with the center of the lesion.  Brushings were performed.  The locatable guide then was replaced and with manipulation the catheter was much more  centered on the lesion.  Additional brushings were  performed.  All sampling was done with fluoroscopy.  While the brushings were being examined multiple transbronchial biopsies were performed.  There was some bleeding with the biopsies.  The quick prep on the brushings showed adequate specimen for  diagnosis of nonsmall cell carcinoma.  Multiple additional biopsies were taken.  Final inspection was made with fluoroscopy and there was no evidence of pneumothorax.  The total fluoroscopy time was 0.6 minutes and the dose was 12 mGy.  The right upper  lobe bronchus was copiously irrigated with saline and blood was evacuated.  The scope was removed and after 5 minutes, the scope was reinserted and a final inspection was made.  There was no ongoing bleeding.  The patient then was extubated in the  operating room and taken to the [facility] in good condition.  CN/NUANCE  D:02/02/2020 T:02/02/2020 JOB:[id number]

## 2020-02-02 NOTE — Anesthesia Postprocedure Evaluation (Signed)
Anesthesia Post Note  Patient: Laura Walker  Procedure(s) Performed: VIDEO BRONCHOSCOPY WITH ENDOBRONCHIAL NAVIGATION (N/A )     Patient location during evaluation: PACU Anesthesia Type: General Level of consciousness: awake and alert, oriented and patient cooperative Pain management: pain level controlled Vital Signs Assessment: post-procedure vital signs reviewed and stable Respiratory status: spontaneous breathing, nonlabored ventilation and respiratory function stable Cardiovascular status: blood pressure returned to baseline and stable Postop Assessment: no apparent nausea or vomiting Anesthetic complications: no    Last Vitals:  Vitals:   02/02/20 0646 02/02/20 1025  BP: 127/90   Pulse: 92   Resp: 19   Temp: 37.1 C (!) 36.3 C  SpO2: 98%     Last Pain:  Vitals:   02/02/20 1025  PainSc: 0-No pain                 Jarome Matin Ein Rijo

## 2020-02-02 NOTE — Brief Op Note (Signed)
02/02/2020  10:21 AM  PATIENT:  Laura Walker  86 y.o. female  PRE-OPERATIVE DIAGNOSIS:  right upper lobe lung mass  POST-OPERATIVE DIAGNOSIS:  Non-small cell carcinoma right upper lobe, Clinical stage IB (T2N0)  PROCEDURE:  Procedure(s): VIDEO BRONCHOSCOPY WITH ENDOBRONCHIAL NAVIGATION (N/A) Brushings and transbronchial biopsies  SURGEON:  Surgeon(s) and Role:    * Melrose Nakayama, MD - Primary  PHYSICIAN ASSISTANT:   ASSISTANTS: none   ANESTHESIA:   general  EBL:  minimal  BLOOD ADMINISTERED:none  DRAINS: none   LOCAL MEDICATIONS USED:  NONE  SPECIMEN:  Source of Specimen:  RUL mass  DISPOSITION OF SPECIMEN:  PATHOLOGY  COUNTS:  NO endoscopic  TOURNIQUET:  * No tourniquets in log *  DICTATION: .Other Dictation: Dictation Number -  PLAN OF CARE: Discharge to home after PACU  PATIENT DISPOSITION:  PACU - hemodynamically stable.   Delay start of Pharmacological VTE agent (>24hrs) due to surgical blood loss or risk of bleeding: not applicable

## 2020-02-02 NOTE — Consult Note (Addendum)
Cardiology Consultation:   Patient ID: Laura Walker MRN: [id number]; DOB: Nov 02, 1930  Admit date: 02/02/2020 Date of Consult: 02/02/2020  Primary Care Provider: [name], MD Primary Cardiologist: New Primary Electrophysiologist:  None    Patient Profile:   [name] is a 86 y.o. female with a hx of CKD stage III, hypercholesterolemia, OSA on CPAP, osteopenia, remote tobacco use, dementia, and recently diagnosed afib who is being seen today for the evaluation of pre-op evaluation at the request of Laura Walker.  History of Present Illness:   Laura Walker has not been seen by cardiology in the past. PMH significant for hyperlipidemia and afib. No history of MI, stent, heart failure, stroke, DM. No known family history of cardiac disease. Remote tobacco and alcohol use. She lives with her daughter and is not very functional at baseline.    The patient was seen in the ED 2/11 for AMS and fall found to be in Afib, 78 bpm which was a new dx at the time. CHADSVASC = 3 (age, female) It appears anticoagulation was not started. During the same visit a CT chest showed a lung mass. Oncology was consulted who arranged follow-up. She was ultimately referred to cardiothoracic surgery for bronchoscopy. Thus the patient was scheduled for bronchoscopy today, 02/02/20. Cardiology asked to evaluate for new dx of Afib prior to procedure.   Heart Pathway Score:     Past Medical History:  Diagnosis Date  . Anxiety   . Arthritis   . CKD (chronic kidney disease)   . Dementia ([place])   . Depression   . Dysrhythmia    new onset AFIB.   Marland Kitchen Hypercholesteremia   . Memory loss   . OSA (obstructive sleep apnea)    CPAP  . Osteopenia     Past Surgical History:  Procedure Laterality Date  . ANKLE SURGERY Left    as child, fracture  . BUNIONECTOMY       Home Medications:  Prior to Admission medications   Medication Sig Start Date End Date Taking? Authorizing Provider    carboxymethylcellulose (REFRESH TEARS) 0.5 % SOLN Place 1 drop into both eyes 2 (two) times daily as needed (dry eyes).   Yes [provider]  Cholecalciferol (VITAMIN D) 50 MCG (2000 UT) tablet Take 2,000 Units by mouth daily.   Yes [provider]  ciclopirox (PENLAC) 8 % solution Apply 1 drop topically daily. On toenails 08/01/18  Yes [provider]  clotrimazole-betamethasone (LOTRISONE) cream Apply 1 application topically daily as needed (fungus).   Yes [provider]  donepezil (ARICEPT) 10 MG tablet TAKE 1 TABLET BY MOUTH AT BEDTIME Patient taking differently: Take 10 mg by mouth daily in the afternoon.  09/07/19  Yes Penumalli, Earlean Polka, MD  DULoxetine (CYMBALTA) 30 MG capsule Take 30 mg by mouth daily.   Yes [provider]  meloxicam (MOBIC) 7.5 MG tablet Take 15 mg by mouth daily in the afternoon.  08/01/18  Yes [provider]  memantine (NAMENDA) 10 MG tablet Take 1 tablet by mouth twice daily Patient taking differently: Take 10 mg by mouth 2 (two) times daily.  09/07/19  Yes Penumalli, Earlean Polka, MD  Omega-3 Fatty Acids (FISH OIL ULTRA) 1400 MG CAPS Take 1,400 mg by mouth daily.   Yes [provider]  oxybutynin (DITROPAN-XL) 5 MG 24 hr tablet Take 5 mg by mouth daily in the afternoon.   Yes [provider]  Red Yeast Rice 600 MG CAPS Take 600 mg  by mouth daily in the afternoon.   Yes [provider]  triamcinolone cream (KENALOG) 0.1 % Apply 1 application topically daily as needed (dry skin).   Yes [provider]  vitamin B-12 (CYANOCOBALAMIN) 1000 MCG tablet Take 1,000 mcg by mouth daily.   Yes [provider]  Naphazoline-Pheniramine (OPCON-A) 0.027-0.315 % SOLN Place 1 drop into both eyes daily as needed (allergies).    [provider]    Inpatient Medications: Scheduled Meds:  Continuous Infusions: . lactated ringers 10 mL/hr at 02/02/20 0718   PRN  Meds:   Allergies:    Allergies  Allergen Reactions  . Tramadol Nausea Only    Social History:   Social History   Socioeconomic History  . Marital status: Widowed    Spouse name: Not on file  . Number of children: 5  . Years of education: 33  . Highest education level: Not on file  Occupational History    Comment: retired sub teacher/tutor  Tobacco Use  . Smoking status: Former Smoker    Quit date: 12/13/1984    Years since quitting: 35.1  . Smokeless tobacco: Never Used  Substance and Sexual Activity  . Alcohol use: Yes    Comment: occas wine  . Drug use: No  . Sexual activity: Not on file  Other Topics Concern  . Not on file  Social History Narrative   Lives with daughter   Caffeine- coffee 2 cups, occas tea   Social Determinants of Health   Financial Resource Strain:   . Difficulty of Paying Living Expenses: Not on file  Food Insecurity:   . Worried About Charity fundraiser in the Last Year: Not on file  . Ran Out of Food in the Last Year: Not on file  Transportation Needs:   . Lack of Transportation (Medical): Not on file  . Lack of Transportation (Non-Medical): Not on file  Physical Activity:   . Days of Exercise per Week: Not on file  . Minutes of Exercise per Session: Not on file  Stress:   . Feeling of Stress : Not on file  Social Connections:   . Frequency of Communication with Friends and Family: Not on file  . Frequency of Social Gatherings with Friends and Family: Not on file  . Attends Religious Services: Not on file  . Active Member of Clubs or Organizations: Not on file  . Attends Archivist Meetings: Not on file  . Marital Status: Not on file  Intimate Partner Violence:   . Fear of Current or Ex-Partner: Not on file  . Emotionally Abused: Not on file  . Physically Abused: Not on file  . Sexually Abused: Not on file    Family History:   Family History  Problem Relation Age of Onset  . Cancer - Lung Mother   . Heart disease  Father   . Atrial fibrillation Sister      ROS:  Please see the history of present illness.  All other ROS reviewed and negative.     Physical Exam/Data:   Vitals:   02/02/20 0646 02/02/20 0706  BP: 127/90   Pulse: 92   Resp: 19   Temp: 98.7 F (37.1 C)   SpO2: 98%   Weight:  102.1 kg  Height: 5\' 3"  (1.6 m)    No intake or output data in the 24 hours ending 02/02/20 0804 Last 3 Weights 02/02/2020 01/31/2020 01/26/2020  Weight (lbs) 225 lb 225 lb 225 lb  Weight (kg)  102.059 kg 102.059 kg 102.059 kg     Body mass index is 39.86 kg/m.  General:  Well nourished, well developed, in no acute distress HEENT: normal Lymph: no adenopathy Neck: no JVD Endocrine:  No thryomegaly Vascular: No carotid bruits; FA pulses 2+ bilaterally without bruits  Cardiac:  normal S1, S2; Irreg Irreg; no murmur  Lungs:  Minimal crackles at bases  Abd: soft, nontender, no hepatomegaly  Ext: 1+ LL edema Musculoskeletal:  No deformities, BUE and BLE strength normal and equal Skin: warm and dry  Neuro:  CNs 2-12 intact, no focal abnormalities noted Psych:  Normal affect   EKG:  The EKG was personally reviewed and demonstrates:  Afib 88 bpm, LBBB Telemetry:  Telemetry was personally reviewed and demonstrates:  N/A  Relevant CV Studies:  Bedside Echo - Preserved pump function  Laboratory Data:  High Sensitivity Troponin:   Recent Labs  Lab 01/06/20 1025 01/06/20 1201  TROPONINIHS 10 12     Chemistry Recent Labs  Lab 01/31/20 0851  NA 138  K 4.3  CL 105  CO2 25  GLUCOSE 104*  BUN 24*  CREATININE 0.91  CALCIUM 9.6  GFRNONAA 57*  GFRAA >60  ANIONGAP 8    Recent Labs  Lab 01/31/20 0851  PROT 6.1*  ALBUMIN 3.4*  AST 12*  ALT 13  ALKPHOS 72  BILITOT 0.9   Hematology Recent Labs  Lab 01/31/20 0851  WBC 8.6  RBC 4.74  HGB 13.5  HCT 43.6  MCV 92.0  MCH 28.5  MCHC 31.0  RDW 14.0  PLT 251   BNPNo results for input(s): BNP, PROBNP in the last 168 hours.  DDimer No  results for input(s): DDIMER in the last 168 hours.   Radiology/Studies:  DG Chest 2 View  Result Date: 01/31/2020 CLINICAL DATA:  86 year old female, with upcoming bronchoscopy EXAM: CHEST - 2 VIEW COMPARISON:  PET-CT 03/26/2020, plain film 01/06/2020 FINDINGS: Cardiomediastinal silhouette unchanged in size and contour. Opacity in the right suprahilar region, corresponds to findings on prior PET CT and plain film. Low lung volumes with no pneumothorax. No pleural effusion. Interstitial opacities persist compared to the prior plain films. No interlobular septal thickening. IMPRESSION: Low lung volumes with atelectasis and no evidence of acute cardiopulmonary disease. Right suprahilar mass corresponds to the imaging findings on prior PET-CT. Electronically Signed   By: Corrie Mckusick D.O.   On: 01/31/2020 09:17     Assessment and Plan:   Pre-op evaluation for bronchoscopy for CT lung mass Patient recently found to be in Afib with controlled rates in the ED. Unsure how long this had been going on for. There are limited prior EKGs. CHADSVASC = 11 (age, female). She was not started on anticoagulation nor had full work-up for Afib.  - Patient has no significant previous cardiac hisory - She denies unstable during activity. She has SOB at night and wears her CPAP. No known family history of cardiac issues. - Patient is not very active at baseline. She uses a roller to move around the hours, but most of her day is spent sitting. She is no longer able to perform ADLs and therefore lives with her daughter - Remote alcohol and smoking history - Today EKG shows Afib with LBBB. Afib is rate controlled and patient appears to be asymptomatic.  - Bedside Echo performed by Dr. Marlou Porch shows normal pump function  - METS  3.63.  - Revised cardiac risk index class II risk, 6% for 30 day risk of death,  MI, or cardiac arrest. Patient is overall moderate risk given comorbidities, age, and functionality. - It is likely  OK for patient to proceed with procedure given rate controlled Afib and asymptomatic. Would likely need to be seen in OP by cardiology for initiation of blood thinner and further work-up.   For questions or updates, please contact [place] Please consult [email or web address] for contact info under     Signed, Cadence Ninfa Meeker, PA-C  02/02/2020 8:04 AM   Personally seen and examined. Agree with above.   86 year old female here for bronchoscopy with stage III chronic kidney disease hyperlipidemia obstructive sleep apnea wearing CPAP remote smoker recent diagnosis of atrial fibrillation, weakness at home walks with a walker seen for preoperative risk evaluation.  No orthopnea no PND no syncope no bleeding no anginal symptoms no significant shortness of breath with her level of activity.  Her daughter was present for discussion and assisted with historical items.  She has never had a prior stress test or heart evaluation.  She was diagnosed recently with atrial fibrillation in the emergency department and was to follow-up with cardiology.  On no anticoagulation.  GEN: Well nourished, well developed, in no acute distress, elderly HEENT: normal  Neck: no JVD, carotid bruits, or masses Cardiac: irreg irreg; no murmurs, rubs, or gallops,no edema  Respiratory:  clear to auscultation bilaterally, normal work of breathing GI: soft, nontender, nondistended, + BS MS: no deformity or atrophy  Skin: warm and dry, no rash Neuro:  Alert and Oriented x 3, Strength and sensation are intact Psych: euthymic mood, full affect  EKG personally reviewed shows left bundle branch block atrial fibrillation rate controlled.  Bedside echocardiogram performed shows overall normal EF, contractile performance consistent with left bundle branch block, no evidence of aortic stenosis, no evidence of significant mitral regurgitation.  Right ventricle appears normal in function.  Lab work reviewed as above.  Assessment  and plan:  Preoperative cardiac risk stratification: -See above for details but she is of moderate risk for surgery given her overall age.  Her atrial fibrillation is well rate controlled currently, no anginal symptoms no unexplained syncope no evidence of heart failure.  She may proceed.  I have relayed this to nursing staff and short stay as well as send message to Laura Walker.  Her daughter understands her overall moderately increased risk.  Persistent atrial fibrillation -Currently well rate controlled with underlying left bundle branch block. -We will follow up with her as outpatient and obtain an echocardiogram at that time. -When Laura Walker feels comfortable from a surgical perspective, we should initiate Eliquis 5 mg twice a day for anticoagulation and avoid aspirin minimize bleeding risks.  Obstructive sleep apnea -Continue to wear CPAP.  Left and right block -Noted on ECG.  No syncope  We will see her in clinic follow-up.  Please let us know if we can be of further assistance.  Candee Furbish, MD

## 2020-02-03 LAB — SURGICAL PATHOLOGY

## 2020-02-03 LAB — CYTOLOGY - NON PAP

## 2020-02-04 DIAGNOSIS — E7800 Pure hypercholesterolemia, unspecified: Secondary | ICD-10-CM | POA: Diagnosis not present

## 2020-02-04 DIAGNOSIS — I4891 Unspecified atrial fibrillation: Secondary | ICD-10-CM | POA: Diagnosis not present

## 2020-02-04 DIAGNOSIS — Z9181 History of falling: Secondary | ICD-10-CM | POA: Diagnosis not present

## 2020-02-04 DIAGNOSIS — G4733 Obstructive sleep apnea (adult) (pediatric): Secondary | ICD-10-CM | POA: Diagnosis not present

## 2020-02-04 DIAGNOSIS — R296 Repeated falls: Secondary | ICD-10-CM | POA: Diagnosis not present

## 2020-02-04 DIAGNOSIS — R918 Other nonspecific abnormal finding of lung field: Secondary | ICD-10-CM | POA: Diagnosis not present

## 2020-02-04 DIAGNOSIS — Z87891 Personal history of nicotine dependence: Secondary | ICD-10-CM | POA: Diagnosis not present

## 2020-02-04 DIAGNOSIS — I129 Hypertensive chronic kidney disease with stage 1 through stage 4 chronic kidney disease, or unspecified chronic kidney disease: Secondary | ICD-10-CM | POA: Diagnosis not present

## 2020-02-04 DIAGNOSIS — E559 Vitamin D deficiency, unspecified: Secondary | ICD-10-CM | POA: Diagnosis not present

## 2020-02-04 DIAGNOSIS — M170 Bilateral primary osteoarthritis of knee: Secondary | ICD-10-CM | POA: Diagnosis not present

## 2020-02-04 DIAGNOSIS — M8580 Other specified disorders of bone density and structure, unspecified site: Secondary | ICD-10-CM | POA: Diagnosis not present

## 2020-02-04 DIAGNOSIS — F0390 Unspecified dementia without behavioral disturbance: Secondary | ICD-10-CM | POA: Diagnosis not present

## 2020-02-04 DIAGNOSIS — F329 Major depressive disorder, single episode, unspecified: Secondary | ICD-10-CM | POA: Diagnosis not present

## 2020-02-04 DIAGNOSIS — H9190 Unspecified hearing loss, unspecified ear: Secondary | ICD-10-CM | POA: Diagnosis not present

## 2020-02-04 DIAGNOSIS — E211 Secondary hyperparathyroidism, not elsewhere classified: Secondary | ICD-10-CM | POA: Diagnosis not present

## 2020-02-04 DIAGNOSIS — N183 Chronic kidney disease, stage 3 unspecified: Secondary | ICD-10-CM | POA: Diagnosis not present

## 2020-02-07 ENCOUNTER — Inpatient Hospital Stay: Payer: PPO | Attending: Internal Medicine | Admitting: Internal Medicine

## 2020-02-07 ENCOUNTER — Encounter: Payer: Self-pay | Admitting: Internal Medicine

## 2020-02-07 ENCOUNTER — Encounter: Payer: Self-pay | Admitting: *Deleted

## 2020-02-07 ENCOUNTER — Other Ambulatory Visit: Payer: Self-pay

## 2020-02-07 DIAGNOSIS — Z79899 Other long term (current) drug therapy: Secondary | ICD-10-CM | POA: Diagnosis not present

## 2020-02-07 DIAGNOSIS — K449 Diaphragmatic hernia without obstruction or gangrene: Secondary | ICD-10-CM | POA: Insufficient documentation

## 2020-02-07 DIAGNOSIS — E7800 Pure hypercholesterolemia, unspecified: Secondary | ICD-10-CM | POA: Diagnosis not present

## 2020-02-07 DIAGNOSIS — C3411 Malignant neoplasm of upper lobe, right bronchus or lung: Secondary | ICD-10-CM | POA: Diagnosis not present

## 2020-02-07 DIAGNOSIS — M8580 Other specified disorders of bone density and structure, unspecified site: Secondary | ICD-10-CM | POA: Insufficient documentation

## 2020-02-07 DIAGNOSIS — N189 Chronic kidney disease, unspecified: Secondary | ICD-10-CM | POA: Diagnosis not present

## 2020-02-07 DIAGNOSIS — F418 Other specified anxiety disorders: Secondary | ICD-10-CM | POA: Diagnosis not present

## 2020-02-07 DIAGNOSIS — G4730 Sleep apnea, unspecified: Secondary | ICD-10-CM | POA: Insufficient documentation

## 2020-02-07 DIAGNOSIS — R222 Localized swelling, mass and lump, trunk: Secondary | ICD-10-CM | POA: Insufficient documentation

## 2020-02-07 DIAGNOSIS — C3490 Malignant neoplasm of unspecified part of unspecified bronchus or lung: Secondary | ICD-10-CM | POA: Diagnosis not present

## 2020-02-07 DIAGNOSIS — C3491 Malignant neoplasm of unspecified part of right bronchus or lung: Secondary | ICD-10-CM

## 2020-02-07 NOTE — Progress Notes (Signed)
Oncology Nurse Navigator Documentation  Oncology Nurse Navigator Flowsheets 02/07/2020  Abnormal Finding Date 01/06/2020  Confirmed Diagnosis Date 02/02/2020  Diagnosis Status Confirmed Diagnosis Complete  Planned Course of Treatment Radiation  Phase of Treatment Radiation  Navigator Follow Up Date: 02/10/2020  Navigator Follow Up Reason: Appointment Review  Navigator Location [facility]  Referral Date to RadOnc/MedOnc -  Navigator Encounter Type Clinic/MDC  Telephone -  Multidisiplinary Clinic Date -  Multidisiplinary Clinic Type -  Patient Visit Type MedOnc/spoke with patient and daughter today.  I help to explain treatment plan with radiation.   Per Dr. Julien Nordmann, I updated pathology dept to send tissue for foundation one and PDL 1. Case number [id number].    Treatment Phase Pre-Tx/Tx Discussion  Barriers/Navigation Needs Coordination of Care;Education  Education Newly Diagnosed Cancer Education;Other  Interventions Coordination of Care;Education;Psycho-Social Support  Acuity Level 3-Moderate Needs (3-4 Barriers Identified)  Coordination of Care Other  Education Method Verbal;Written  Time Spent with Patient 30

## 2020-02-07 NOTE — Progress Notes (Signed)
Laura Walker [phone]   [phone]  OFFICE PROGRESS NOTE  [name], MD [address] [place] [address]  DIAGNOSIS: Stage Ib (T2 a, N0, M0) non-small cell lung cancer, adenocarcinoma presented with right upper lobe lung mass diagnosed in March 2021.  PRIOR THERAPY: None  CURRENT THERAPY: Curative SBRT to the right upper lobe lung mass expected to start in the next few days.  INTERVAL HISTORY: SHALAYNA ORNSTEIN 86 y.o. female returns to the clinic today for follow-up visit accompanied by her daughter Golden Circle.  The patient is feeling fine today with no concerning complaints except for mild cough and shortness of breath with exertion.  She denied having any current chest pain or hemoptysis.  She denied having any nausea, vomiting, diarrhea or constipation.  She has no headache or visual changes.  She denied having any fever or chills.  She had a recent a PET scan as well as bronchoscopy under the care of Dr. Roxan Hockey and she is here today for evaluation and discussion of her biopsy and scan results as well as treatment options.  MEDICAL HISTORY: Past Medical History:  Diagnosis Date  . Anxiety   . Arthritis   . CKD (chronic kidney disease)   . Dementia ([place])   . Depression   . Dysrhythmia    new onset AFIB.   Marland Kitchen Hypercholesteremia   . Memory loss   . OSA (obstructive sleep apnea)    CPAP  . Osteopenia     ALLERGIES:  is allergic to tramadol.  MEDICATIONS:  Current Outpatient Medications  Medication Sig Dispense Refill  . carboxymethylcellulose (REFRESH TEARS) 0.5 % SOLN Place 1 drop into both eyes 2 (two) times daily as needed (dry eyes).    . Cholecalciferol (VITAMIN D) 50 MCG (2000 UT) tablet Take 2,000 Units by mouth daily.    . ciclopirox (PENLAC) 8 % solution Apply 1 drop topically daily. On toenails  4  . clotrimazole-betamethasone (LOTRISONE) cream Apply 1 application topically daily as needed  (fungus).    Marland Kitchen donepezil (ARICEPT) 10 MG tablet TAKE 1 TABLET BY MOUTH AT BEDTIME (Patient taking differently: Take 10 mg by mouth daily in the afternoon. ) 30 tablet 0  . DULoxetine (CYMBALTA) 30 MG capsule Take 30 mg by mouth daily.    . meloxicam (MOBIC) 7.5 MG tablet Take 15 mg by mouth daily in the afternoon.   4  . memantine (NAMENDA) 10 MG tablet Take 1 tablet by mouth twice daily (Patient taking differently: Take 10 mg by mouth 2 (two) times daily. ) 60 tablet 0  . Naphazoline-Pheniramine (OPCON-A) 0.027-0.315 % SOLN Place 1 drop into both eyes daily as needed (allergies).    . Omega-3 Fatty Acids (FISH OIL ULTRA) 1400 MG CAPS Take 1,400 mg by mouth daily.    Marland Kitchen oxybutynin (DITROPAN-XL) 5 MG 24 hr tablet Take 5 mg by mouth daily in the afternoon.    . Red Yeast Rice 600 MG CAPS Take 600 mg by mouth daily in the afternoon.    . triamcinolone cream (KENALOG) 0.1 % Apply 1 application topically daily as needed (dry skin).    . vitamin B-12 (CYANOCOBALAMIN) 1000 MCG tablet Take 1,000 mcg by mouth daily.     No current facility-administered medications for this visit.    SURGICAL HISTORY:  Past Surgical History:  Procedure Laterality Date  . ANKLE SURGERY Left    as child, fracture  . BUNIONECTOMY    .  VIDEO BRONCHOSCOPY WITH ENDOBRONCHIAL NAVIGATION N/A 02/02/2020   Procedure: VIDEO BRONCHOSCOPY WITH ENDOBRONCHIAL NAVIGATION;  Surgeon: Melrose Nakayama, MD;  Location: [facility];  Service: Thoracic;  Laterality: N/A;    REVIEW OF SYSTEMS:  Constitutional: positive for fatigue Eyes: negative Ears, nose, mouth, throat, and face: negative Respiratory: positive for cough and dyspnea on exertion Cardiovascular: negative Gastrointestinal: negative Genitourinary:negative Integument/breast: negative Hematologic/lymphatic: negative Musculoskeletal:positive for muscle weakness Neurological: negative Behavioral/Psych: negative Endocrine: negative Allergic/Immunologic: negative    PHYSICAL EXAMINATION: General appearance: alert, cooperative, fatigued and no distress Head: Normocephalic, without obvious abnormality, atraumatic Neck: no adenopathy, no JVD, supple, symmetrical, trachea midline and thyroid not enlarged, symmetric, no tenderness/mass/nodules Lymph nodes: Cervical, supraclavicular, and axillary nodes normal. Resp: clear to auscultation bilaterally Back: symmetric, no curvature. ROM normal. No CVA tenderness. Cardio: regular rate and rhythm, S1, S2 normal, no murmur, click, rub or gallop GI: soft, non-tender; bowel sounds normal; no masses,  no organomegaly Extremities: extremities normal, atraumatic, no cyanosis or edema Neurologic: Alert and oriented X 3, normal strength and tone. Normal symmetric reflexes. Normal coordination and gait  ECOG PERFORMANCE STATUS: 1 - Symptomatic but completely ambulatory  Blood pressure (!) 163/114, pulse 82, temperature 97.8 F (36.6 C), temperature source Oral, resp. rate 18, height 5\' 3"  (1.6 m), SpO2 99 %.  LABORATORY DATA: Lab Results  Component Value Date   WBC 8.6 01/31/2020   HGB 13.5 01/31/2020   HCT 43.6 01/31/2020   MCV 92.0 01/31/2020   PLT 251 01/31/2020      Chemistry      Component Value Date/Time   NA 138 01/31/2020 0851   K 4.3 01/31/2020 0851   CL 105 01/31/2020 0851   CO2 25 01/31/2020 0851   BUN 24 (H) 01/31/2020 0851   CREATININE 0.91 01/31/2020 0851   CREATININE 0.90 01/13/2020 1341      Component Value Date/Time   CALCIUM 9.6 01/31/2020 0851   ALKPHOS 72 01/31/2020 0851   AST 12 (L) 01/31/2020 0851   AST 10 (L) 01/13/2020 1341   ALT 13 01/31/2020 0851   ALT 14 01/13/2020 1341   BILITOT 0.9 01/31/2020 0851   BILITOT 0.3 01/13/2020 1341       RADIOGRAPHIC STUDIES: DG Chest 2 View  Result Date: 01/31/2020 CLINICAL DATA:  86 year old female, with upcoming bronchoscopy EXAM: CHEST - 2 VIEW COMPARISON:  PET-CT 03/26/2020, plain film 01/06/2020 FINDINGS: Cardiomediastinal  silhouette unchanged in size and contour. Opacity in the right suprahilar region, corresponds to findings on prior PET CT and plain film. Low lung volumes with no pneumothorax. No pleural effusion. Interstitial opacities persist compared to the prior plain films. No interlobular septal thickening. IMPRESSION: Low lung volumes with atelectasis and no evidence of acute cardiopulmonary disease. Right suprahilar mass corresponds to the imaging findings on prior PET-CT. Electronically Signed   By: Corrie Mckusick D.O.   On: 01/31/2020 09:17   NM PET Image Initial (PI) Skull Base To Thigh  Result Date: 01/25/2020 CLINICAL DATA:  Initial treatment strategy for right upper lobe lung mass. EXAM: NUCLEAR MEDICINE PET SKULL BASE TO THIGH TECHNIQUE: 11.3 mCi F-18 FDG was injected intravenously. Full-ring PET imaging was performed from the skull base to thigh after the radiotracer. CT data was obtained and used for attenuation correction and anatomic localization. Fasting blood glucose: 97 mg/dl COMPARISON:  Chest CT 01/06/2020 FINDINGS: Mediastinal blood pool activity: SUV max 3.0 Liver activity: SUV max NA NECK: No significant abnormal hypermetabolic activity in this region. Incidental CT findings: Bilateral common carotid  atherosclerotic calcification CHEST: 3.3 by 2.2 cm right upper lobe mass has maximum SUV of 4.3, favoring low-grade malignancy. Incidental CT findings: Coronary, aortic arch, and branch vessel atherosclerotic vascular disease. Mild cardiomegaly. Small type 1 hiatal hernia. ABDOMEN/PELVIS: Focally accentuated activity in the distal transverse colon, with a 1.1 cm focus of activity having a maximum SUV of 14.0. Other segmental accentuated bowel activity is most likely physiologic, but this focus of activity is nonspecific and could be physiologic or due to a polyp. Incidental CT findings: Descending and sigmoid colon diverticulosis. Aortoiliac atherosclerotic vascular disease. Peripelvic renal cysts  bilaterally. Left renal atrophy. Questionable punctate 1-2 mm nonobstructive renal calculi in the lower poles of both kidneys. SKELETON: No significant abnormal hypermetabolic activity in this region. Incidental CT findings: Bilateral degenerative glenohumeral arthropathy. Moderate-sized umbilical hernia contains adipose tissue. Lumbar spondylosis and degenerative disc disease. IMPRESSION: 1. The 3.2 by 2.2 cm right upper lobe mass has low-grade metabolic activity, maximum SUV 4.3, favoring low-grade malignancy. No findings of distant metastatic spread. 2. Small focus of accentuated activity in the distal transverse colon, polyp versus focal physiologic activity. Correlate with the patient's colon screening history in determining whether colonoscopy is appropriate. 3. Other imaging findings of potential clinical significance: Aortic Atherosclerosis (ICD10-I70.0). Coronary atherosclerosis. Mild cardiomegaly. Small type 1 hiatal hernia. Moderate-sized umbilical hernia containing adipose tissue. Descending and sigmoid colon diverticulosis. Left renal atrophy. Probable mild punctate nonobstructive bilateral nephrolithiasis. Degenerative glenohumeral arthropathy bilaterally. Lumbar spondylosis and degenerative disc disease. Electronically Signed   By: Van Clines M.D.   On: 01/25/2020 14:10   DG C-ARM BRONCHOSCOPY  Result Date: 02/02/2020 C-ARM BRONCHOSCOPY: Fluoroscopy was utilized by the requesting physician.  No radiographic interpretation.    ASSESSMENT AND PLAN: This is a very pleasant 86 years old white female recently diagnosed with a stage Ib (T2 a, N0, M0) non-small cell lung cancer, adenocarcinoma presented with right upper lobe lung mass.  The patient is not a good surgical candidate because of her age and other comorbidities. She had a recent PET scan performed as well as bronchoscopy by Dr. Roxan Hockey and the final pathology was consistent with adenocarcinoma. I personally and independently  reviewed the PET scan imaging and discussed the results as well as the pathology report with the patient and her daughter. I recommended for the patient to see radiation oncology for consideration of SBRT to the right upper lobe lung mass with curative intention. I will arrange for the patient to come back for follow-up visit in 4 months for evaluation with repeat CT scan of the chest for restaging of her disease after the radiotherapy. The patient will continue her other home medications for now. She was advised to call immediately if she has any concerning symptoms in the interval. The patient voices understanding of current disease status and treatment options and is in agreement with the current care plan.  All questions were answered. The patient knows to call the clinic with any problems, questions or concerns. We can certainly see the patient much sooner if necessary.  The total time spent in the appointment was 30 minutes.  Disclaimer: This note was dictated with voice recognition software. Similar sounding words can inadvertently be transcribed and may not be corrected upon review.

## 2020-02-08 ENCOUNTER — Encounter: Payer: Self-pay | Admitting: *Deleted

## 2020-02-08 DIAGNOSIS — M8580 Other specified disorders of bone density and structure, unspecified site: Secondary | ICD-10-CM | POA: Diagnosis not present

## 2020-02-08 DIAGNOSIS — Z9181 History of falling: Secondary | ICD-10-CM | POA: Diagnosis not present

## 2020-02-08 DIAGNOSIS — I4891 Unspecified atrial fibrillation: Secondary | ICD-10-CM | POA: Diagnosis not present

## 2020-02-08 DIAGNOSIS — R296 Repeated falls: Secondary | ICD-10-CM | POA: Diagnosis not present

## 2020-02-08 DIAGNOSIS — F329 Major depressive disorder, single episode, unspecified: Secondary | ICD-10-CM | POA: Diagnosis not present

## 2020-02-08 DIAGNOSIS — R918 Other nonspecific abnormal finding of lung field: Secondary | ICD-10-CM | POA: Diagnosis not present

## 2020-02-08 DIAGNOSIS — H9190 Unspecified hearing loss, unspecified ear: Secondary | ICD-10-CM | POA: Diagnosis not present

## 2020-02-08 DIAGNOSIS — M170 Bilateral primary osteoarthritis of knee: Secondary | ICD-10-CM | POA: Diagnosis not present

## 2020-02-08 DIAGNOSIS — F0390 Unspecified dementia without behavioral disturbance: Secondary | ICD-10-CM | POA: Diagnosis not present

## 2020-02-08 DIAGNOSIS — E211 Secondary hyperparathyroidism, not elsewhere classified: Secondary | ICD-10-CM | POA: Diagnosis not present

## 2020-02-08 DIAGNOSIS — G4733 Obstructive sleep apnea (adult) (pediatric): Secondary | ICD-10-CM | POA: Diagnosis not present

## 2020-02-08 DIAGNOSIS — N183 Chronic kidney disease, stage 3 unspecified: Secondary | ICD-10-CM | POA: Diagnosis not present

## 2020-02-08 DIAGNOSIS — E7800 Pure hypercholesterolemia, unspecified: Secondary | ICD-10-CM | POA: Diagnosis not present

## 2020-02-08 DIAGNOSIS — I129 Hypertensive chronic kidney disease with stage 1 through stage 4 chronic kidney disease, or unspecified chronic kidney disease: Secondary | ICD-10-CM | POA: Diagnosis not present

## 2020-02-08 DIAGNOSIS — Z87891 Personal history of nicotine dependence: Secondary | ICD-10-CM | POA: Diagnosis not present

## 2020-02-08 DIAGNOSIS — E559 Vitamin D deficiency, unspecified: Secondary | ICD-10-CM | POA: Diagnosis not present

## 2020-02-08 NOTE — Progress Notes (Signed)
Pathology notified me that there is not enough tissue to send for molecular and PDL 1 testing. I updated Dr. Julien Nordmann.

## 2020-02-10 ENCOUNTER — Other Ambulatory Visit: Payer: Self-pay | Admitting: *Deleted

## 2020-02-10 ENCOUNTER — Telehealth: Payer: Self-pay | Admitting: Medical Oncology

## 2020-02-10 NOTE — Progress Notes (Signed)
The proposed treatment discussed in cancer conference 02/10/20 is for discussion purpose only and is not a binding recommendation.  The patient was not physically examined nor present for their treatment options.  Therefore, final treatment plans cannot be decided.

## 2020-02-10 NOTE — Telephone Encounter (Signed)
Received HCPoA and living will documents and sent to HIM for scanning.

## 2020-02-15 DIAGNOSIS — N183 Chronic kidney disease, stage 3 unspecified: Secondary | ICD-10-CM | POA: Diagnosis not present

## 2020-02-15 DIAGNOSIS — I129 Hypertensive chronic kidney disease with stage 1 through stage 4 chronic kidney disease, or unspecified chronic kidney disease: Secondary | ICD-10-CM | POA: Diagnosis not present

## 2020-02-15 DIAGNOSIS — R918 Other nonspecific abnormal finding of lung field: Secondary | ICD-10-CM | POA: Diagnosis not present

## 2020-02-15 DIAGNOSIS — F329 Major depressive disorder, single episode, unspecified: Secondary | ICD-10-CM | POA: Diagnosis not present

## 2020-02-15 DIAGNOSIS — F0390 Unspecified dementia without behavioral disturbance: Secondary | ICD-10-CM | POA: Diagnosis not present

## 2020-02-15 DIAGNOSIS — R296 Repeated falls: Secondary | ICD-10-CM | POA: Diagnosis not present

## 2020-02-15 DIAGNOSIS — M8580 Other specified disorders of bone density and structure, unspecified site: Secondary | ICD-10-CM | POA: Diagnosis not present

## 2020-02-15 DIAGNOSIS — Z9181 History of falling: Secondary | ICD-10-CM | POA: Diagnosis not present

## 2020-02-15 DIAGNOSIS — Z87891 Personal history of nicotine dependence: Secondary | ICD-10-CM | POA: Diagnosis not present

## 2020-02-15 DIAGNOSIS — H9190 Unspecified hearing loss, unspecified ear: Secondary | ICD-10-CM | POA: Diagnosis not present

## 2020-02-15 DIAGNOSIS — G4733 Obstructive sleep apnea (adult) (pediatric): Secondary | ICD-10-CM | POA: Diagnosis not present

## 2020-02-15 DIAGNOSIS — E211 Secondary hyperparathyroidism, not elsewhere classified: Secondary | ICD-10-CM | POA: Diagnosis not present

## 2020-02-15 DIAGNOSIS — E559 Vitamin D deficiency, unspecified: Secondary | ICD-10-CM | POA: Diagnosis not present

## 2020-02-15 DIAGNOSIS — I4891 Unspecified atrial fibrillation: Secondary | ICD-10-CM | POA: Diagnosis not present

## 2020-02-15 DIAGNOSIS — M170 Bilateral primary osteoarthritis of knee: Secondary | ICD-10-CM | POA: Diagnosis not present

## 2020-02-15 DIAGNOSIS — E7800 Pure hypercholesterolemia, unspecified: Secondary | ICD-10-CM | POA: Diagnosis not present

## 2020-02-16 ENCOUNTER — Ambulatory Visit: Payer: PPO

## 2020-02-17 ENCOUNTER — Other Ambulatory Visit: Payer: Self-pay

## 2020-02-17 ENCOUNTER — Ambulatory Visit
Admission: RE | Admit: 2020-02-17 | Discharge: 2020-02-17 | Disposition: A | Discharge status: Home / Self Care | Payer: PPO | Source: Ambulatory Visit | Attending: Radiation Oncology | Admitting: Radiation Oncology

## 2020-02-17 ENCOUNTER — Encounter: Payer: Self-pay | Admitting: Radiation Oncology

## 2020-02-17 VITALS — BP 134/95 | HR 86 | Temp 98.5°F | Resp 24 | Ht 64.0 in

## 2020-02-17 DIAGNOSIS — Z87891 Personal history of nicotine dependence: Secondary | ICD-10-CM | POA: Insufficient documentation

## 2020-02-17 DIAGNOSIS — F418 Other specified anxiety disorders: Secondary | ICD-10-CM | POA: Insufficient documentation

## 2020-02-17 DIAGNOSIS — F0390 Unspecified dementia without behavioral disturbance: Secondary | ICD-10-CM | POA: Diagnosis not present

## 2020-02-17 DIAGNOSIS — Z801 Family history of malignant neoplasm of trachea, bronchus and lung: Secondary | ICD-10-CM | POA: Diagnosis not present

## 2020-02-17 DIAGNOSIS — Z923 Personal history of irradiation: Secondary | ICD-10-CM | POA: Insufficient documentation

## 2020-02-17 DIAGNOSIS — C3411 Malignant neoplasm of upper lobe, right bronchus or lung: Secondary | ICD-10-CM | POA: Insufficient documentation

## 2020-02-17 DIAGNOSIS — C3491 Malignant neoplasm of unspecified part of right bronchus or lung: Secondary | ICD-10-CM

## 2020-02-17 DIAGNOSIS — Z79899 Other long term (current) drug therapy: Secondary | ICD-10-CM | POA: Insufficient documentation

## 2020-02-17 DIAGNOSIS — N189 Chronic kidney disease, unspecified: Secondary | ICD-10-CM | POA: Insufficient documentation

## 2020-02-17 DIAGNOSIS — E7800 Pure hypercholesterolemia, unspecified: Secondary | ICD-10-CM | POA: Diagnosis not present

## 2020-02-17 DIAGNOSIS — M8580 Other specified disorders of bone density and structure, unspecified site: Secondary | ICD-10-CM | POA: Diagnosis not present

## 2020-02-17 DIAGNOSIS — G4733 Obstructive sleep apnea (adult) (pediatric): Secondary | ICD-10-CM | POA: Insufficient documentation

## 2020-02-17 NOTE — Progress Notes (Signed)
Thoracic Location of Tumor / Histology: Right upper lobe, adenocarcinoma  Patient presented with symptoms of: Her caregiver saw her early in the morning and found her laying on the floor.  She presented to the emergency department for further evaluation of her condition.  Chest x-ray was performed on 01/06/2020 and showed focal opacity in the medial right upper lobe.  This was followed by CT scan of the chest with contrast on 01/06/2020 and showed 3.6 x 3.2 x 3.1 cm right upper lobe lung mass consistent with a primary lung neoplasm.  There was no other pulmonary lesions or nodules.  There was small 0.8 cm mediastinal lymph node.  CT of the head without contrast performed on the same day showed no acute intracranial abnormalities or skull fractures.  There was remote lacunar infarcts in the left thalamus.    Biopsies revealed: 02/02/20:  FINAL MICROSCOPIC DIAGNOSIS:   A. LUNG, RIGHT UPPER LOBE, BIOPSY:  - Adenocarcinoma  Tobacco/Marijuana/Snuff/ETOH use:  Tobacco Use  . Smoking status: Former Smoker    Quit date: 12/13/1984    Years since quitting: 35.1  . Smokeless tobacco: Never Used  Substance Use Topics  . Alcohol use: Yes    Comment: occas wine  . Drug use: No     Past/Anticipated interventions by cardiothoracic surgery, if any: 02/02/20: PROCEDURE:  Electromagnetic navigational bronchoscopy with brushings and transbronchial biopsies.  SURGEON:  Modesto Charon, MD  Past/Anticipated interventions by medical oncology, if any: Per Dr. Julien Nordmann 02/07/20: ASSESSMENT AND PLAN: This is a very pleasant 86 years old white female recently diagnosed with a stage Ib (T2 a, N0, M0) non-small cell lung cancer, adenocarcinoma presented with right upper lobe lung mass.  The patient is not a good surgical candidate because of her age and other comorbidities. She had a recent PET scan performed as well as bronchoscopy by Dr. Roxan Hockey and the final pathology was consistent with adenocarcinoma. I  personally and independently reviewed the PET scan imaging and discussed the results as well as the pathology report with the patient and her daughter. I recommended for the patient to see radiation oncology for consideration of SBRT to the right upper lobe lung mass with curative intention. I will arrange for the patient to come back for follow-up visit in 4 months for evaluation with repeat CT scan of the chest for restaging of her disease after the radiotherapy. The patient will continue her other home medications for now. She was advised to call immediately if she has any concerning symptoms in the interval. The patient voices understanding of current disease status and treatment options and is in agreement with the current care plan  Signs/Symptoms Weight changes, if any:  Wt Readings from Last 3 Encounters:  02/02/20 225 lb (102.1 kg)  01/31/20 225 lb (102.1 kg)  01/26/20 225 lb (102.1 kg)      The patient is feeling fine today with no concerning complaints except for mild cough and shortness of breath with exertion.  She denied having any current chest pain or hemoptysis.  She denied having any nausea, vomiting, diarrhea or constipation.  She has no headache or visual changes.  She denied having any fever or chills.   Pain issues, if any:  Pt reports chronic pain in bilateral knees, attributed to needing bilateral knee replacements.  SAFETY ISSUES:  Prior radiation? no  Pacemaker/ICD? no   Possible current pregnancy?no  Is the patient on methotrexate? no  Current Complaints / other details:  Pt presents today for initial consult  with Dr. Sondra Come for Radiation Oncology. Pt is accompanied by daughter, who is historian. Pt is HOH.   BP (!) 134/95 (BP Location: Right Arm, Patient Position: Sitting, Cuff Size: Normal)   Pulse 86   Temp 98.5 F (36.9 C)   Resp (!) 24   Ht 5\' 4"  (1.626 m)   SpO2 99%   BMI 38.62 kg/m   Loma Sousa, RN BSN

## 2020-02-17 NOTE — Progress Notes (Signed)
Radiation Oncology         ([phone]) [phone] ________________________________  Initial Outpatient Consultation  Name: Laura Walker MRN: [id number]  Date: 02/17/2020  DOB: 1930-03-08  CC:[name], MD  Curt Bears, MD   REFERRING PHYSICIAN: Curt Bears, MD  DIAGNOSIS: The encounter diagnosis was Adenocarcinoma of right lung, stage 1 ([place]).  Stage Ib (T2 a, N0, M0) non-small cell lung cancer, adenocarcinoma presented with right upper lobe lung mass  HISTORY OF PRESENT ILLNESS::Laura Walker is a 86 y.o. female who is accompanied by her daughter. The patient was found lying on the floor by her caregiver on 01/06/2020. The caregiver also reported, due to the patient's dementia, that the patient had foul-smelling urine for several days prior and was more confused that day. She was taken to the ED, and head CT showed no acute intracranial abnormality. Chest x-ray also performed at that time showed focal opacity in the medial right upper lobe. This led to chest CT, which showed: 3.6 cm RUL lung mass most consistent with primary lung neoplasm; no mediastinal or hilar mass or adenopathy; no findings for upper abdominal metastatic disease.  She was referred to Dr. Julien Nordmann on 01/13/2020, who recommended PET scan and referral to cardiothoracic surgery for consideration of bronchoscopy. PET scan was performed on 01/25/2020 and revealed: 3.2 cm RUL mass with low-grade metabolic activity; no findings of distant metastatic spread; small focus of accentuated activity in the distal transverse colon, polyp vs focal physiologic activity.  She met with Dr. Roxan Hockey on 01/26/2020, and they discussed her treatment options. They agreed to proceed with bronchoscopy and possibly radiation therapy, as she is not a surgical candidate and likely not a candidate for chemotherapy.  The patient underwent bronchoscopy with biopsy on 02/02/2020 showing: adenocarcinoma, insufficient material for additional testing.  Cytology of the RUL brushing was also suspicious for malignancy.  PREVIOUS RADIATION THERAPY: No  PAST MEDICAL HISTORY:  Past Medical History:  Diagnosis Date  . Anxiety   . Arthritis   . CKD (chronic kidney disease)   . Dementia ([place])   . Depression   . Dysrhythmia    new onset AFIB.   Marland Kitchen Hypercholesteremia   . Memory loss   . OSA (obstructive sleep apnea)    CPAP  . Osteopenia     PAST SURGICAL HISTORY: Past Surgical History:  Procedure Laterality Date  . ANKLE SURGERY Left    as child, fracture  . BUNIONECTOMY    . VIDEO BRONCHOSCOPY WITH ENDOBRONCHIAL NAVIGATION N/A 02/02/2020   Procedure: VIDEO BRONCHOSCOPY WITH ENDOBRONCHIAL NAVIGATION;  Surgeon: Melrose Nakayama, MD;  Location: [facility] OR;  Service: Thoracic;  Laterality: N/A;    FAMILY HISTORY:  Family History  Problem Relation Age of Onset  . Cancer - Lung Mother   . Heart disease Father   . Atrial fibrillation Sister     SOCIAL HISTORY:  Social History   Tobacco Use  . Smoking status: Former Smoker    Quit date: 12/13/1984    Years since quitting: 35.2  . Smokeless tobacco: Never Used  Substance Use Topics  . Alcohol use: Yes    Comment: occas wine  . Drug use: No    ALLERGIES:  Allergies  Allergen Reactions  . Tramadol Nausea Only    MEDICATIONS:  Current Outpatient Medications  Medication Sig Dispense Refill  . carboxymethylcellulose (REFRESH TEARS) 0.5 % SOLN Place 1 drop into both eyes 2 (two) times daily as needed (dry eyes).    . Cholecalciferol (VITAMIN D)  50 MCG (2000 UT) tablet Take 2,000 Units by mouth daily.    . ciclopirox (PENLAC) 8 % solution Apply 1 drop topically daily. On toenails  4  . clotrimazole-betamethasone (LOTRISONE) cream Apply 1 application topically daily as needed (fungus).    Marland Kitchen donepezil (ARICEPT) 10 MG tablet TAKE 1 TABLET BY MOUTH AT BEDTIME (Patient taking differently: Take 10 mg by mouth daily in the afternoon. ) 30 tablet 0  . DULoxetine (CYMBALTA) 30 MG  capsule Take 30 mg by mouth daily.    . meloxicam (MOBIC) 7.5 MG tablet Take 15 mg by mouth daily in the afternoon.   4  . memantine (NAMENDA) 10 MG tablet Take 1 tablet by mouth twice daily (Patient taking differently: Take 10 mg by mouth 2 (two) times daily. ) 60 tablet 0  . Naphazoline-Pheniramine (OPCON-A) 0.027-0.315 % SOLN Place 1 drop into both eyes daily as needed (allergies).    . Omega-3 Fatty Acids (FISH OIL ULTRA) 1400 MG CAPS Take 1,400 mg by mouth daily.    Marland Kitchen oxybutynin (DITROPAN-XL) 5 MG 24 hr tablet Take 5 mg by mouth daily in the afternoon.    . Red Yeast Rice 600 MG CAPS Take 600 mg by mouth daily in the afternoon.    . triamcinolone cream (KENALOG) 0.1 % Apply 1 application topically daily as needed (dry skin).    . vitamin B-12 (CYANOCOBALAMIN) 1000 MCG tablet Take 1,000 mcg by mouth daily.     No current facility-administered medications for this encounter.    REVIEW OF SYSTEMS:  A 10+ POINT REVIEW OF SYSTEMS WAS OBTAINED including neurology, dermatology, psychiatry, cardiac, respiratory, lymph, extremities, GI, GU, musculoskeletal, constitutional, reproductive, HEENT. She reports mild cough and shortness of breath with exertion. She denies chest pain, hemoptysis, nausea or vomiting, diarrhea or constipation, headache or visual changes, and any fever or chills. She also reports chronic bilateral knee pain.   PHYSICAL EXAM:  height is '5\' 4"'$  (1.626 m). Her temperature is 98.5 F (36.9 C). Her blood pressure is 134/95 (abnormal) and her pulse is 86. Her respiration is 24 (abnormal) and oxygen saturation is 99%.   General: Alert and oriented, in no acute distress, remains in wheelchair for evaluation, hard of hearing, hearing aid in the right ear, daughter very helpful with discussion HEENT: Head is normocephalic. Extraocular movements are intact.  Neck: Neck is supple, no palpable cervical or supraclavicular lymphadenopathy. Heart: Regular in rate and rhythm with no murmurs,  rubs, or gallops. Chest: Clear to auscultation bilaterally, with no rhonchi, wheezes, or rales. Abdomen: Soft, nontender, nondistended, with no rigidity or guarding. Extremities: No cyanosis or edema. Lymphatics: see Neck Exam Skin: No concerning lesions. Musculoskeletal: symmetric strength and muscle tone throughout. Neurologic: Cranial nerves II through XII are grossly intact. No obvious focalities. Speech is fluent. Coordination is intact. Psychiatric: Judgment and insight are intact. Affect is appropriate.   ECOG = 3  0 - Asymptomatic (Fully active, able to carry on all predisease activities without restriction)  1 - Symptomatic but completely ambulatory (Restricted in physically strenuous activity but ambulatory and able to carry out work of a light or sedentary nature. For example, light housework, office work)  2 - Symptomatic, <50% in bed during the day (Ambulatory and capable of all self care but unable to carry out any work activities. Up and about more than 50% of waking hours)  3 - Symptomatic, >50% in bed, but not bedbound (Capable of only limited self-care, confined to bed or chair 50% or  more of waking hours)  4 - Bedbound (Completely disabled. Cannot carry on any self-care. Totally confined to bed or chair)  5 - Death   Eustace Pen MM, Creech RH, Tormey DC, et al. [phone]). "Toxicity and response criteria of the [facility]". [place] Oncol. 5 (6): 649-55  LABORATORY DATA:  Lab Results  Component Value Date   WBC 8.6 01/31/2020   HGB 13.5 01/31/2020   HCT 43.6 01/31/2020   MCV 92.0 01/31/2020   PLT 251 01/31/2020   NEUTROABS 5.3 01/13/2020   Lab Results  Component Value Date   NA 138 01/31/2020   K 4.3 01/31/2020   CL 105 01/31/2020   CO2 25 01/31/2020   GLUCOSE 104 (H) 01/31/2020   CREATININE 0.91 01/31/2020   CALCIUM 9.6 01/31/2020      RADIOGRAPHY: DG Chest 2 View  Result Date: 01/31/2020 CLINICAL DATA:  86 year old female, with  upcoming bronchoscopy EXAM: CHEST - 2 VIEW COMPARISON:  PET-CT 03/26/2020, plain film 01/06/2020 FINDINGS: Cardiomediastinal silhouette unchanged in size and contour. Opacity in the right suprahilar region, corresponds to findings on prior PET CT and plain film. Low lung volumes with no pneumothorax. No pleural effusion. Interstitial opacities persist compared to the prior plain films. No interlobular septal thickening. IMPRESSION: Low lung volumes with atelectasis and no evidence of acute cardiopulmonary disease. Right suprahilar mass corresponds to the imaging findings on prior PET-CT. Electronically Signed   By: Corrie Mckusick D.O.   On: 01/31/2020 09:17   NM PET Image Initial (PI) Skull Base To Thigh  Result Date: 01/25/2020 CLINICAL DATA:  Initial treatment strategy for right upper lobe lung mass. EXAM: NUCLEAR MEDICINE PET SKULL BASE TO THIGH TECHNIQUE: 11.3 mCi F-18 FDG was injected intravenously. Full-ring PET imaging was performed from the skull base to thigh after the radiotracer. CT data was obtained and used for attenuation correction and anatomic localization. Fasting blood glucose: 97 mg/dl COMPARISON:  Chest CT 01/06/2020 FINDINGS: Mediastinal blood pool activity: SUV max 3.0 Liver activity: SUV max NA NECK: No significant abnormal hypermetabolic activity in this region. Incidental CT findings: Bilateral common carotid atherosclerotic calcification CHEST: 3.3 by 2.2 cm right upper lobe mass has maximum SUV of 4.3, favoring low-grade malignancy. Incidental CT findings: Coronary, aortic arch, and branch vessel atherosclerotic vascular disease. Mild cardiomegaly. Small type 1 hiatal hernia. ABDOMEN/PELVIS: Focally accentuated activity in the distal transverse colon, with a 1.1 cm focus of activity having a maximum SUV of 14.0. Other segmental accentuated bowel activity is most likely physiologic, but this focus of activity is nonspecific and could be physiologic or due to a polyp. Incidental CT  findings: Descending and sigmoid colon diverticulosis. Aortoiliac atherosclerotic vascular disease. Peripelvic renal cysts bilaterally. Left renal atrophy. Questionable punctate 1-2 mm nonobstructive renal calculi in the lower poles of both kidneys. SKELETON: No significant abnormal hypermetabolic activity in this region. Incidental CT findings: Bilateral degenerative glenohumeral arthropathy. Moderate-sized umbilical hernia contains adipose tissue. Lumbar spondylosis and degenerative disc disease. IMPRESSION: 1. The 3.2 by 2.2 cm right upper lobe mass has low-grade metabolic activity, maximum SUV 4.3, favoring low-grade malignancy. No findings of distant metastatic spread. 2. Small focus of accentuated activity in the distal transverse colon, polyp versus focal physiologic activity. Correlate with the patient's colon screening history in determining whether colonoscopy is appropriate. 3. Other imaging findings of potential clinical significance: Aortic Atherosclerosis (ICD10-I70.0). Coronary atherosclerosis. Mild cardiomegaly. Small type 1 hiatal hernia. Moderate-sized umbilical hernia containing adipose tissue. Descending and sigmoid colon diverticulosis. Left renal atrophy.  Probable mild punctate nonobstructive bilateral nephrolithiasis. Degenerative glenohumeral arthropathy bilaterally. Lumbar spondylosis and degenerative disc disease. Electronically Signed   By: Van Clines M.D.   On: 01/25/2020 14:10   DG C-ARM BRONCHOSCOPY  Result Date: 02/02/2020 C-ARM BRONCHOSCOPY: Fluoroscopy was utilized by the requesting physician.  No radiographic interpretation.      IMPRESSION: Stage Ib (T2 a, N0, M0) non-small cell lung cancer, adenocarcinoma presented with right upper lobe lung mass  Patient would be a good candidate for definitive course of radiation therapy directed at her solitary mass in the right upper lobe.  Depending on treatment planning she may potentially be a candidate for stereotactic  body radiation therapy.  If not then hypofractionated accelerated radiation therapy over approximately 10 treatments.  Today, I talked to the patient and daughter about the findings and work-up thus far.  We discussed the natural history of lung cancer and general treatment, highlighting the role of radiotherapy in the management.  We discussed the available radiation techniques, and focused on the details of logistics and delivery.  We reviewed the anticipated acute and late sequelae associated with radiation in this setting.  The patient was encouraged to ask questions that I answered to the best of my ability.  A patient consent form was discussed and signed.  We retained a copy for our records.  The patient would like to proceed with radiation and will be scheduled for CT simulation.  PLAN: CT simulation on March 29 at 10:30 treatments to begin approximately a week later.    ------------------------------------------------  Blair Promise, PhD, MD  This document serves as a record of services personally performed by Gery Pray, MD. It was created on his behalf by Wilburn Mylar, a trained medical scribe. The creation of this record is based on the scribe's personal observations and the provider's statements to them. This document has been checked and approved by the attending provider.

## 2020-02-17 NOTE — Patient Instructions (Signed)
Coronavirus (COVID-19) Are you at risk?  Are you at risk for the Coronavirus (COVID-19)?  To be considered HIGH RISK for Coronavirus (COVID-19), you have to meet the following criteria:  . Traveled to [place], [place], [place], [place] or [place]; or in the [place] to [place], [place], [place], or [place]; and have fever, cough, and shortness of breath within the last 2 weeks of travel OR . Been in close contact with a person diagnosed with COVID-19 within the last 2 weeks and have fever, cough, and shortness of breath . IF YOU DO NOT MEET THESE CRITERIA, YOU ARE CONSIDERED LOW RISK FOR COVID-19.  What to do if you are HIGH RISK for COVID-19?  . If you are having a medical emergency, call 911. . Seek medical care right away. Before you go to a doctor's office, urgent care or emergency department, call ahead and tell them about your recent travel, contact with someone diagnosed with COVID-19, and your symptoms. You should receive instructions from your physician's office regarding next steps of care.  . When you arrive at healthcare provider, tell the healthcare staff immediately you have returned from visiting [place], [place], [place], [place] or [place]; or traveled in the [place] to [place], [place], [place], or [place]; in the last two weeks or you have been in close contact with a person diagnosed with COVID-19 in the last 2 weeks.   . Tell the health care staff about your symptoms: fever, cough and shortness of breath. . After you have been seen by a medical provider, you will be either: o Tested for (COVID-19) and discharged home on quarantine except to seek medical care if symptoms worsen, and asked to  - Stay home and avoid contact with others until you get your results (4-5 days)  - Avoid travel on public transportation if possible (such as bus, train, or airplane) or o Sent to the Emergency Department by EMS for evaluation, COVID-19 testing, and possible  admission depending on your condition and test results.  What to do if you are LOW RISK for COVID-19?  Reduce your risk of any infection by using the same precautions used for avoiding the common cold or flu:  . Wash your hands often with soap and warm water for at least 20 seconds.  If soap and water are not readily available, use an alcohol-based hand sanitizer with at least 60% alcohol.  . If coughing or sneezing, cover your mouth and nose by coughing or sneezing into the elbow areas of your shirt or coat, into a tissue or into your sleeve (not your hands). . Avoid shaking hands with others and consider head nods or verbal greetings only. . Avoid touching your eyes, nose, or mouth with unwashed hands.  . Avoid close contact with people who are sick. . Avoid places or events with large numbers of people in one location, like concerts or sporting events. . Carefully consider travel plans you have or are making. . If you are planning any travel outside or inside the [place], visit the CDC's Travelers' Health webpage for the latest health notices. . If you have some symptoms but not all symptoms, continue to monitor at home and seek medical attention if your symptoms worsen. . If you are having a medical emergency, call 911.   ADDITIONAL HEALTHCARE OPTIONS FOR PATIENTS  [facility] / e-Visit: [email or web address]         [facility]: [phone]  [facility]: [phone]                   [facility]: [phone]   

## 2020-02-18 ENCOUNTER — Ambulatory Visit: Payer: PPO | Admitting: Cardiology

## 2020-02-18 ENCOUNTER — Encounter: Payer: Self-pay | Admitting: Cardiology

## 2020-02-18 VITALS — BP 110/80 | HR 92 | Ht 64.0 in | Wt 260.0 lb

## 2020-02-18 DIAGNOSIS — I447 Left bundle-branch block, unspecified: Secondary | ICD-10-CM | POA: Diagnosis not present

## 2020-02-18 DIAGNOSIS — I4819 Other persistent atrial fibrillation: Secondary | ICD-10-CM

## 2020-02-18 MED ORDER — APIXABAN 5 MG PO TABS: 5.0000 mg | ORAL_TABLET | Freq: Two times a day (BID) | ORAL | 11 refills | Status: DC

## 2020-02-18 NOTE — Patient Instructions (Signed)
Medication Instructions:  Please start Eliquis 5 mg twice a day. Continue all other medications as listed.  *If you need a refill on your cardiac medications before your next appointment, please call your pharmacy*  Follow-Up: At [facility], you and your health needs are our priority.  As part of our continuing mission to provide you with exceptional heart care, we have created designated Provider Care Teams.  These Care Teams include your primary Cardiologist (physician) and Advanced Practice Providers (APPs -  Physician Assistants and Nurse Practitioners) who all work together to provide you with the care you need, when you need it.  We recommend signing up for the patient portal called "MyChart".  Sign up information is provided on this After Visit Summary.  MyChart is used to connect with patients for Virtual Visits (Telemedicine).  Patients are able to view lab/test results, encounter notes, upcoming appointments, etc.  Non-urgent messages can be sent to your provider as well.   To learn more about what you can do with MyChart, go to [email or web address].    Your next appointment:   3 month(s)  The format for your next appointment:   In Person  Provider:   Kathyrn Drown, NP  and 6 months with Dr Marlou Porch.   Thank you for choosing [facility]!!

## 2020-02-18 NOTE — Progress Notes (Signed)
Cardiology Office Note:    Date:  02/18/2020   ID:  Laura Walker, DOB 1930/08/27, MRN [id number]  PCP:  Laura Jordan, MD  Cardiologist:  Candee Furbish, MD  Electrophysiologist:  None   Referring MD: Laura Jordan, MD     History of Present Illness:    Laura Walker is a 86 y.o. female here for the evaluation/follow-up of atrial fibrillation.  Was diagnosed with atrial fibrillation during a preop evaluation by Dr. Roxan Walker.  No prior history of MI stent heart failure stroke diabetes.  Lives with her daughter.  Limited functional capacity.  Back in the emergency department in February 2021 she had acute mental status and fall was found to be in atrial fibrillation 70 bpm.  That was new.  CT scan during that visit showed a lung mass.  She was referred for bronchoscopy.  I saw her in the waiting bay prior to bronchoscopy.  Her atrial fibrillation heart rate was under good control.  Bedside echo showed normal pump function.  We talked about starting Eliquis when able.  Had successful bronchoscopy by Dr. Roxan Walker.  She was diagnosed with non-small cell carcinoma right upper lobe.  She saw Dr. Earlie Walker on 02/07/2020.  He recommended radiation oncology.  They discussed with radiation oncology, Dr. Sondra Walker and she will be undergoing 2 weeks of radiation.  Overall she is comfortable in her wheelchair, no recurrent falls.  No shortness of breath no chest pain.  Her daughter helped with historical items.  She does have some memory loss.  Past Medical History:  Diagnosis Date  . Anxiety   . Arthritis   . CKD (chronic kidney disease)   . Dementia ([place])   . Depression   . Dysrhythmia    new onset AFIB.   Laura Walker Hypercholesteremia   . Memory loss   . OSA (obstructive sleep apnea)    CPAP  . Osteopenia     Past Surgical History:  Procedure Laterality Date  . ANKLE SURGERY Left    as child, fracture  . BUNIONECTOMY    . VIDEO BRONCHOSCOPY WITH ENDOBRONCHIAL NAVIGATION N/A  02/02/2020   Procedure: VIDEO BRONCHOSCOPY WITH ENDOBRONCHIAL NAVIGATION;  Surgeon: Laura Nakayama, MD;  Location: [facility] OR;  Service: Thoracic;  Laterality: N/A;    Current Medications: Current Meds  Medication Sig  . carboxymethylcellulose (REFRESH TEARS) 0.5 % SOLN Place 1 drop into both eyes 2 (two) times daily as needed (dry eyes).  . Cholecalciferol (VITAMIN D) 50 MCG (2000 UT) tablet Take 2,000 Units by mouth daily.  . ciclopirox (PENLAC) 8 % solution Apply 1 drop topically daily. On toenails  . clotrimazole-betamethasone (LOTRISONE) cream Apply 1 application topically daily as needed (fungus).  Laura Walker donepezil (ARICEPT) 10 MG tablet TAKE 1 TABLET BY MOUTH AT BEDTIME  . DULoxetine (CYMBALTA) 30 MG capsule Take 30 mg by mouth daily.  . meloxicam (MOBIC) 7.5 MG tablet Take 15 mg by mouth daily in the afternoon.   . memantine (NAMENDA) 10 MG tablet Take 1 tablet by mouth twice daily  . Naphazoline-Pheniramine (OPCON-A) 0.027-0.315 % SOLN Place 1 drop into both eyes daily as needed (allergies).  . Omega-3 Fatty Acids (FISH OIL ULTRA) 1400 MG CAPS Take 1,400 mg by mouth daily.  Laura Walker oxybutynin (DITROPAN-XL) 5 MG 24 hr tablet Take 5 mg by mouth daily in the afternoon.  . Red Yeast Rice 600 MG CAPS Take 600 mg by mouth daily in the afternoon.  . triamcinolone cream (KENALOG) 0.1 % Apply 1 application topically  daily as needed (dry skin).  . vitamin B-12 (CYANOCOBALAMIN) 1000 MCG tablet Take 1,000 mcg by mouth daily.     Allergies:   Tramadol   Social History   Socioeconomic History  . Marital status: Widowed    Spouse name: Not on file  . Number of children: 5  . Years of education: 2  . Highest education level: Not on file  Occupational History    Comment: retired sub teacher/tutor  Tobacco Use  . Smoking status: Former Smoker    Quit date: 12/13/1984    Years since quitting: 35.2  . Smokeless tobacco: Never Used  Substance and Sexual Activity  . Alcohol use: Yes    Comment:  occas wine  . Drug use: No  . Sexual activity: Not on file  Other Topics Concern  . Not on file  Social History Narrative   Lives with daughter   Caffeine- coffee 2 cups, occas tea   Social Determinants of Health   Financial Resource Strain:   . Difficulty of Paying Living Expenses:   Food Insecurity:   . Worried About Charity fundraiser in the Last Year:   . Arboriculturist in the Last Year:   Transportation Needs:   . Film/video editor (Medical):   Laura Walker Lack of Transportation (Non-Medical):   Physical Activity:   . Days of Exercise per Week:   . Minutes of Exercise per Session:   Stress:   . Feeling of Stress :   Social Connections:   . Frequency of Communication with Friends and Family:   . Frequency of Social Gatherings with Friends and Family:   . Attends Religious Services:   . Active Member of Clubs or Organizations:   . Attends Archivist Meetings:   Laura Walker Marital Status:      Family History: The patient's family history includes Atrial fibrillation in her sister; Cancer - Lung in her mother; Heart disease in her father.  ROS:   Please see the history of present illness.     All other systems reviewed and are negative.  EKGs/Labs/Other Studies Reviewed:    The following studies were reviewed today: Prior bedside echo showed normal EF.  EKG:  EKG is not ordered today.  Prior EKG on 02/02/2020 showed continued atrial fibrillation with left bundle branch block.  Recent Labs: 01/31/2020: ALT 13; BUN 24; Creatinine, Ser 0.91; Hemoglobin 13.5; Platelets 251; Potassium 4.3; Sodium 138  Recent Lipid Panel No results found for: CHOL, TRIG, HDL, CHOLHDL, VLDL, LDLCALC, LDLDIRECT  Physical Exam:    VS:  BP 110/80   Pulse 92   Ht 5\' 4"  (1.626 m)   Wt 260 lb (117.9 kg)   SpO2 (!) 89%   BMI 44.63 kg/m     Wt Readings from Last 3 Encounters:  02/18/20 260 lb (117.9 kg)  02/02/20 225 lb (102.1 kg)  01/31/20 225 lb (102.1 kg)     GEN: In wheelchair  well nourished, well developed in no acute distress HEENT: Normal NECK: No JVD; No carotid bruits LYMPHATICS: No lymphadenopathy CARDIAC: Irregularly irregular, no murmurs, rubs, gallops RESPIRATORY:  Clear to auscultation without rales, wheezing or rhonchi  ABDOMEN: Soft, non-tender, non-distended MUSCULOSKELETAL: Persistent 2+ lower extremity edema; No deformity  SKIN: Warm and dry NEUROLOGIC:  Alert, underlying dementia noted PSYCHIATRIC:  Normal affect   ASSESSMENT:    1. Persistent atrial fibrillation ([place])   2. LBBB (left bundle branch block)    PLAN:    In order  of problems listed above:  Persistent atrial fibrillation -Overall well rate controlled. -I would like for her to go ahead and start Eliquis 5 mg twice a day.  I will message radiation oncologist and oncologist to make sure that they are okay with this.  Obviously if the radiation treatments were to precipitate bleeding, we can always hold off until she is finished with radiation and then start.  Left bundle branch block -Next follow-up, consider echocardiogram  Chronic anticoagulation -I had lengthy discussion with her daughter.  She would like for her to be on anticoagulation.  I did express concerns over bleeding risks.  Obviously if she has worsening falls or if dementia becomes more severe, we can always discontinue.  Lung cancer -2 weeks of radiation therapy for hopeful curative treatment.    Medication Adjustments/Labs and Tests Ordered: Current medicines are reviewed at length with the patient today.  Concerns regarding medicines are outlined above.  No orders of the defined types were placed in this encounter.  Meds ordered this encounter  Medications  . apixaban (ELIQUIS) 5 MG TABS tablet    Sig: Take 1 tablet (5 mg total) by mouth 2 (two) times daily.    Dispense:  60 tablet    Refill:  11    Patient Instructions  Medication Instructions:  Please start Eliquis 5 mg twice a day. Continue all  other medications as listed.  *If you need a refill on your cardiac medications before your next appointment, please call your pharmacy*  Follow-Up: At [facility], you and your health needs are our priority.  As part of our continuing mission to provide you with exceptional heart care, we have created designated Provider Care Teams.  These Care Teams include your primary Cardiologist (physician) and Advanced Practice Providers (APPs -  Physician Assistants and Nurse Practitioners) who all work together to provide you with the care you need, when you need it.  We recommend signing up for the patient portal called "MyChart".  Sign up information is provided on this After Visit Summary.  MyChart is used to connect with patients for Virtual Visits (Telemedicine).  Patients are able to view lab/test results, encounter notes, upcoming appointments, etc.  Non-urgent messages can be sent to your provider as well.   To learn more about what you can do with MyChart, go to [email or web address].    Your next appointment:   3 month(s)  The format for your next appointment:   In Person  Provider:   Kathyrn Drown, NP  and 6 months with Dr Marlou Porch.   Thank you for choosing [facility]!!        Signed, Candee Furbish, MD  02/18/2020 12:15 PM    [place]

## 2020-02-20 NOTE — Progress Notes (Signed)
  Radiation Oncology         [phone] ________________________________  Name: Laura Walker MRN: [id number]  Date: 02/21/2020  DOB: 1930-07-30   STEREOTACTIC BODY RADIOTHERAPY SIMULATION AND TREATMENT PLANNING NOTE    DIAGNOSIS: Stage Ib (T2 a, N0, M0) non-small cell lung cancer, adenocarcinoma presented with right upper lobe lung mass  NARRATIVE:  The patient was brought to the [facility] suite.  Identity was confirmed.  All relevant records and images related to the planned course of therapy were reviewed.  The patient freely provided informed written consent to proceed with treatment after reviewing the details related to the planned course of therapy. The consent form was witnessed and verified by the simulation staff.  Then, the patient was set-up in a stable reproducible  supine position for radiation therapy.  A BodyFix immobilization pillow was fabricated for reproducible positioning.  Then I personally applied the abdominal compression paddle to limit respiratory excursion.  4D respiratoy motion management CT images were obtained.  Surface markings were placed.  The CT images were loaded into the planning software.  Then, using Cine, MIP, and standard views, the internal target volume (ITV) and planning target volumes (PTV) were delinieated, and avoidance structures were contoured.  Treatment planning then occurred.  The radiation prescription was entered and confirmed.  A total of two complex treatment devices were fabricated in the form of the BodyFix immobilization pillow and a neck accuform cushion.  I have requested : 3D Simulation  I have requested a DVH of the following structures: Heart, Lungs, Esophagus, Chest Wall, Brachial Plexus, Major Blood Vessels, and targets.  PLAN:  The patient will receive 54 Gy in 3 fractions.  -----------------------------------  Blair Promise, PhD, MD  This document serves as a record of services personally performed by Gery Pray, MD. It was created on his behalf by Clerance Lav, a trained medical scribe. The creation of this record is based on the scribe's personal observations and the provider's statements to them. This document has been checked and approved by the attending provider.

## 2020-02-21 ENCOUNTER — Encounter: Payer: Self-pay | Admitting: Radiation Oncology

## 2020-02-21 ENCOUNTER — Other Ambulatory Visit: Payer: Self-pay

## 2020-02-21 ENCOUNTER — Ambulatory Visit
Admission: RE | Admit: 2020-02-21 | Discharge: 2020-02-21 | Disposition: A | Discharge status: Home / Self Care | Payer: PPO | Source: Ambulatory Visit | Attending: Radiation Oncology | Admitting: Radiation Oncology

## 2020-02-21 ENCOUNTER — Telehealth: Payer: Self-pay | Admitting: *Deleted

## 2020-02-21 DIAGNOSIS — C3411 Malignant neoplasm of upper lobe, right bronchus or lung: Secondary | ICD-10-CM | POA: Diagnosis not present

## 2020-02-21 DIAGNOSIS — Z510 Encounter for antineoplastic radiation therapy: Secondary | ICD-10-CM | POA: Insufficient documentation

## 2020-02-21 DIAGNOSIS — C3491 Malignant neoplasm of unspecified part of right bronchus or lung: Secondary | ICD-10-CM

## 2020-02-21 NOTE — Telephone Encounter (Signed)
Spoke with pt's daughter, [name], [place] on file, and she has been made aware that it is ok for pt to start Eliquis. She was grateful for the call.

## 2020-02-21 NOTE — Progress Notes (Signed)
OK to start Eliquis 5mg  PO BID (spoke to Dr. Sondra Come) Candee Furbish, MD

## 2020-02-21 NOTE — Telephone Encounter (Signed)
-----   Message from Jerline Pain, MD sent at 02/21/2020  8:45 AM EDT -----   ----- Message ----- From: Curt Bears, MD Sent: 02/21/2020   8:19 AM EDT To: Gery Pray, MD, Jerline Pain, MD  It should be okay to start her Eliquis.  Thank you. ----- Message ----- From: Jerline Pain, MD Sent: 02/18/2020  12:16 PM EDT To: Gery Pray, MD, Curt Bears, MD  Would it be okay if she starts Eliquis for her atrial fibrillation or should we delay this until after her radiation therapy to the lung mass? Candee Furbish, MD

## 2020-02-22 ENCOUNTER — Encounter: Payer: Self-pay | Admitting: Radiation Oncology

## 2020-02-22 ENCOUNTER — Encounter: Payer: Self-pay | Admitting: *Deleted

## 2020-02-22 DIAGNOSIS — Z9181 History of falling: Secondary | ICD-10-CM | POA: Diagnosis not present

## 2020-02-22 DIAGNOSIS — E211 Secondary hyperparathyroidism, not elsewhere classified: Secondary | ICD-10-CM | POA: Diagnosis not present

## 2020-02-22 DIAGNOSIS — F0390 Unspecified dementia without behavioral disturbance: Secondary | ICD-10-CM | POA: Diagnosis not present

## 2020-02-22 DIAGNOSIS — M170 Bilateral primary osteoarthritis of knee: Secondary | ICD-10-CM | POA: Diagnosis not present

## 2020-02-22 DIAGNOSIS — I4891 Unspecified atrial fibrillation: Secondary | ICD-10-CM | POA: Diagnosis not present

## 2020-02-22 DIAGNOSIS — R918 Other nonspecific abnormal finding of lung field: Secondary | ICD-10-CM | POA: Diagnosis not present

## 2020-02-22 DIAGNOSIS — E559 Vitamin D deficiency, unspecified: Secondary | ICD-10-CM | POA: Diagnosis not present

## 2020-02-22 DIAGNOSIS — Z87891 Personal history of nicotine dependence: Secondary | ICD-10-CM | POA: Diagnosis not present

## 2020-02-22 DIAGNOSIS — M8580 Other specified disorders of bone density and structure, unspecified site: Secondary | ICD-10-CM | POA: Diagnosis not present

## 2020-02-22 DIAGNOSIS — G4733 Obstructive sleep apnea (adult) (pediatric): Secondary | ICD-10-CM | POA: Diagnosis not present

## 2020-02-22 DIAGNOSIS — R296 Repeated falls: Secondary | ICD-10-CM | POA: Diagnosis not present

## 2020-02-22 DIAGNOSIS — E7800 Pure hypercholesterolemia, unspecified: Secondary | ICD-10-CM | POA: Diagnosis not present

## 2020-02-22 DIAGNOSIS — I129 Hypertensive chronic kidney disease with stage 1 through stage 4 chronic kidney disease, or unspecified chronic kidney disease: Secondary | ICD-10-CM | POA: Diagnosis not present

## 2020-02-22 DIAGNOSIS — H9190 Unspecified hearing loss, unspecified ear: Secondary | ICD-10-CM | POA: Diagnosis not present

## 2020-02-22 DIAGNOSIS — F329 Major depressive disorder, single episode, unspecified: Secondary | ICD-10-CM | POA: Diagnosis not present

## 2020-02-22 DIAGNOSIS — N183 Chronic kidney disease, stage 3 unspecified: Secondary | ICD-10-CM | POA: Diagnosis not present

## 2020-02-23 ENCOUNTER — Encounter: Payer: Self-pay | Admitting: Internal Medicine

## 2020-02-24 DIAGNOSIS — Z9181 History of falling: Secondary | ICD-10-CM | POA: Diagnosis not present

## 2020-02-24 DIAGNOSIS — Z87891 Personal history of nicotine dependence: Secondary | ICD-10-CM | POA: Diagnosis not present

## 2020-02-24 DIAGNOSIS — E211 Secondary hyperparathyroidism, not elsewhere classified: Secondary | ICD-10-CM | POA: Diagnosis not present

## 2020-02-24 DIAGNOSIS — N183 Chronic kidney disease, stage 3 unspecified: Secondary | ICD-10-CM | POA: Diagnosis not present

## 2020-02-24 DIAGNOSIS — F329 Major depressive disorder, single episode, unspecified: Secondary | ICD-10-CM | POA: Diagnosis not present

## 2020-02-24 DIAGNOSIS — R918 Other nonspecific abnormal finding of lung field: Secondary | ICD-10-CM | POA: Diagnosis not present

## 2020-02-24 DIAGNOSIS — M170 Bilateral primary osteoarthritis of knee: Secondary | ICD-10-CM | POA: Diagnosis not present

## 2020-02-24 DIAGNOSIS — I129 Hypertensive chronic kidney disease with stage 1 through stage 4 chronic kidney disease, or unspecified chronic kidney disease: Secondary | ICD-10-CM | POA: Diagnosis not present

## 2020-02-24 DIAGNOSIS — E559 Vitamin D deficiency, unspecified: Secondary | ICD-10-CM | POA: Diagnosis not present

## 2020-02-24 DIAGNOSIS — F0390 Unspecified dementia without behavioral disturbance: Secondary | ICD-10-CM | POA: Diagnosis not present

## 2020-02-24 DIAGNOSIS — G4733 Obstructive sleep apnea (adult) (pediatric): Secondary | ICD-10-CM | POA: Diagnosis not present

## 2020-02-24 DIAGNOSIS — R296 Repeated falls: Secondary | ICD-10-CM | POA: Diagnosis not present

## 2020-02-24 DIAGNOSIS — E7800 Pure hypercholesterolemia, unspecified: Secondary | ICD-10-CM | POA: Diagnosis not present

## 2020-02-24 DIAGNOSIS — H9190 Unspecified hearing loss, unspecified ear: Secondary | ICD-10-CM | POA: Diagnosis not present

## 2020-02-24 DIAGNOSIS — I4891 Unspecified atrial fibrillation: Secondary | ICD-10-CM | POA: Diagnosis not present

## 2020-02-24 DIAGNOSIS — M8580 Other specified disorders of bone density and structure, unspecified site: Secondary | ICD-10-CM | POA: Diagnosis not present

## 2020-02-25 DIAGNOSIS — Z510 Encounter for antineoplastic radiation therapy: Secondary | ICD-10-CM | POA: Insufficient documentation

## 2020-02-25 DIAGNOSIS — C3411 Malignant neoplasm of upper lobe, right bronchus or lung: Secondary | ICD-10-CM | POA: Diagnosis not present

## 2020-02-28 ENCOUNTER — Other Ambulatory Visit (HOSPITAL_COMMUNITY): Payer: PPO

## 2020-02-29 ENCOUNTER — Telehealth: Payer: Self-pay

## 2020-02-29 ENCOUNTER — Encounter: Payer: Self-pay | Admitting: Radiation Oncology

## 2020-02-29 NOTE — Progress Notes (Signed)
  Radiation Oncology         [phone] ________________________________  Name: Laura Walker MRN: [id number]  Date: 03/01/2020  DOB: [birth year]  Stereotactic Body Radiotherapy Treatment Procedure Note  NARRATIVE:  Laura Walker was brought to the stereotactic radiation treatment machine and placed supine on the CT couch. The patient was set up for stereotactic body radiotherapy on the body fix pillow.  3D TREATMENT PLANNING AND DOSIMETRY:  The patient's radiation plan was reviewed and approved prior to starting treatment.  It showed 3-dimensional radiation distributions overlaid onto the planning CT.  The [facility] for the target structures as well as the organs at risk were reviewed. The documentation of this is filed in the radiation oncology EMR.  SIMULATION VERIFICATION:  The patient underwent CT imaging on the treatment unit.  These were carefully aligned to document that the ablative radiation dose would cover the target volume and maximally spare the nearby organs at risk according to the planned distribution.  SPECIAL TREATMENT PROCEDURE: Laura Walker received high dose ablative stereotactic body radiotherapy to the planned target volume without unforeseen complications. Treatment was delivered uneventfully. The high doses associated with stereotactic body radiotherapy and the significant potential risks require careful treatment set up and patient monitoring constituting a special treatment procedure   STEREOTACTIC TREATMENT MANAGEMENT:  Following delivery, the patient was evaluated clinically. The patient tolerated treatment without significant acute effects, and was discharged to home in stable condition.    PLAN: Continue treatment as planned.  ________________________________  Blair Promise, PhD, MD  This document serves as a record of services personally performed by Gery Pray, MD. It was created on his behalf by Clerance Lav, a trained medical scribe. The creation of  this record is based on the scribe's personal observations and the provider's statements to them. This document has been checked and approved by the attending provider.

## 2020-02-29 NOTE — Telephone Encounter (Signed)
Daughter states that she spoke with treatment machine and appts were "fixed". Loma Sousa, RN BSN

## 2020-03-01 ENCOUNTER — Other Ambulatory Visit: Payer: Self-pay

## 2020-03-01 ENCOUNTER — Ambulatory Visit
Admission: RE | Admit: 2020-03-01 | Discharge: 2020-03-01 | Disposition: A | Discharge status: Home / Self Care | Payer: PPO | Source: Ambulatory Visit | Attending: Radiation Oncology | Admitting: Radiation Oncology

## 2020-03-01 VITALS — BP 148/75 | HR 84 | Temp 98.3°F | Resp 20

## 2020-03-01 DIAGNOSIS — C3491 Malignant neoplasm of unspecified part of right bronchus or lung: Secondary | ICD-10-CM

## 2020-03-01 DIAGNOSIS — Z510 Encounter for antineoplastic radiation therapy: Secondary | ICD-10-CM | POA: Diagnosis not present

## 2020-03-01 NOTE — Progress Notes (Signed)
Notified that pt was lowered to floor by therapists prior to XRT on LinAcc 1. Pt was evaluated, reports no injuries, no new/sudden pain. This RN offered to transport pt to ED, daughter declined. Dr. Sondra Come evaluated pt. Pt and daughter left RadOnc clinic, NAD noted.   BP (!) 148/75 (Patient Position: Sitting)   Pulse 84   Temp 98.3 F (36.8 C) (Temporal)   Resp 20   SpO2 97%   Loma Sousa, RN BSN

## 2020-03-02 DIAGNOSIS — F0390 Unspecified dementia without behavioral disturbance: Secondary | ICD-10-CM | POA: Diagnosis not present

## 2020-03-02 DIAGNOSIS — Z9181 History of falling: Secondary | ICD-10-CM | POA: Diagnosis not present

## 2020-03-02 DIAGNOSIS — H9190 Unspecified hearing loss, unspecified ear: Secondary | ICD-10-CM | POA: Diagnosis not present

## 2020-03-02 DIAGNOSIS — G4733 Obstructive sleep apnea (adult) (pediatric): Secondary | ICD-10-CM | POA: Diagnosis not present

## 2020-03-02 DIAGNOSIS — I4891 Unspecified atrial fibrillation: Secondary | ICD-10-CM | POA: Diagnosis not present

## 2020-03-02 DIAGNOSIS — E7800 Pure hypercholesterolemia, unspecified: Secondary | ICD-10-CM | POA: Diagnosis not present

## 2020-03-02 DIAGNOSIS — N183 Chronic kidney disease, stage 3 unspecified: Secondary | ICD-10-CM | POA: Diagnosis not present

## 2020-03-02 DIAGNOSIS — I129 Hypertensive chronic kidney disease with stage 1 through stage 4 chronic kidney disease, or unspecified chronic kidney disease: Secondary | ICD-10-CM | POA: Diagnosis not present

## 2020-03-02 DIAGNOSIS — Z87891 Personal history of nicotine dependence: Secondary | ICD-10-CM | POA: Diagnosis not present

## 2020-03-02 DIAGNOSIS — M8580 Other specified disorders of bone density and structure, unspecified site: Secondary | ICD-10-CM | POA: Diagnosis not present

## 2020-03-02 DIAGNOSIS — M170 Bilateral primary osteoarthritis of knee: Secondary | ICD-10-CM | POA: Diagnosis not present

## 2020-03-02 DIAGNOSIS — F329 Major depressive disorder, single episode, unspecified: Secondary | ICD-10-CM | POA: Diagnosis not present

## 2020-03-02 DIAGNOSIS — R296 Repeated falls: Secondary | ICD-10-CM | POA: Diagnosis not present

## 2020-03-02 DIAGNOSIS — E559 Vitamin D deficiency, unspecified: Secondary | ICD-10-CM | POA: Diagnosis not present

## 2020-03-02 DIAGNOSIS — E211 Secondary hyperparathyroidism, not elsewhere classified: Secondary | ICD-10-CM | POA: Diagnosis not present

## 2020-03-02 DIAGNOSIS — R918 Other nonspecific abnormal finding of lung field: Secondary | ICD-10-CM | POA: Diagnosis not present

## 2020-03-03 ENCOUNTER — Other Ambulatory Visit: Payer: Self-pay

## 2020-03-03 ENCOUNTER — Ambulatory Visit
Admission: RE | Admit: 2020-03-03 | Discharge: 2020-03-03 | Disposition: A | Discharge status: Home / Self Care | Payer: PPO | Source: Ambulatory Visit | Attending: Radiation Oncology | Admitting: Radiation Oncology

## 2020-03-03 ENCOUNTER — Ambulatory Visit: Payer: PPO | Admitting: Radiation Oncology

## 2020-03-03 DIAGNOSIS — Z510 Encounter for antineoplastic radiation therapy: Secondary | ICD-10-CM | POA: Diagnosis not present

## 2020-03-06 ENCOUNTER — Ambulatory Visit
Admission: RE | Admit: 2020-03-06 | Discharge: 2020-03-06 | Disposition: A | Discharge status: Home / Self Care | Payer: PPO | Source: Ambulatory Visit | Attending: Radiation Oncology | Admitting: Radiation Oncology

## 2020-03-06 ENCOUNTER — Other Ambulatory Visit: Payer: Self-pay

## 2020-03-06 DIAGNOSIS — Z510 Encounter for antineoplastic radiation therapy: Secondary | ICD-10-CM | POA: Diagnosis not present

## 2020-03-06 DIAGNOSIS — C3491 Malignant neoplasm of unspecified part of right bronchus or lung: Secondary | ICD-10-CM

## 2020-03-06 NOTE — Progress Notes (Signed)
  Radiation Oncology         [phone] ________________________________  Name: Laura Walker MRN: [id number]  Date: 03/06/2020  DOB: 07-Sep-1930  Stereotactic Body Radiotherapy Treatment Procedure Note  NARRATIVE:  Laura Walker was brought to the stereotactic radiation treatment machine and placed supine on the CT couch. The patient was set up for stereotactic body radiotherapy on the body fix pillow.  3D TREATMENT PLANNING AND DOSIMETRY:  The patient's radiation plan was reviewed and approved prior to starting treatment.  It showed 3-dimensional radiation distributions overlaid onto the planning CT.  The [facility] for the target structures as well as the organs at risk were reviewed. The documentation of this is filed in the radiation oncology EMR.  SIMULATION VERIFICATION:  The patient underwent CT imaging on the treatment unit.  These were carefully aligned to document that the ablative radiation dose would cover the target volume and maximally spare the nearby organs at risk according to the planned distribution.  SPECIAL TREATMENT PROCEDURE: Laura Walker received high dose ablative stereotactic body radiotherapy to the planned target volume without unforeseen complications. Treatment was delivered uneventfully. The high doses associated with stereotactic body radiotherapy and the significant potential risks require careful treatment set up and patient monitoring constituting a special treatment procedure   STEREOTACTIC TREATMENT MANAGEMENT:  Following delivery, the patient was evaluated clinically. The patient tolerated treatment without significant acute effects, and was discharged to home in stable condition.    PLAN: Continue treatment as planned.  ________________________________  Blair Promise, PhD, MD  This document serves as a record of services personally performed by Gery Pray, MD. It was created on his behalf by Clerance Lav, a trained medical scribe. The creation of  this record is based on the scribe's personal observations and the provider's statements to them. This document has been checked and approved by the attending provider.

## 2020-03-07 NOTE — Progress Notes (Signed)
  Radiation Oncology         [phone] ________________________________  Name: Laura Walker MRN: [id number]  Date: 03/08/2020  DOB: [birth year]  Stereotactic Body Radiotherapy Treatment Procedure Note  NARRATIVE:  Laura Walker was brought to the stereotactic radiation treatment machine and placed supine on the CT couch. The patient was set up for stereotactic body radiotherapy on the body fix pillow.  3D TREATMENT PLANNING AND DOSIMETRY:  The patient's radiation plan was reviewed and approved prior to starting treatment.  It showed 3-dimensional radiation distributions overlaid onto the planning CT.  The [facility] for the target structures as well as the organs at risk were reviewed. The documentation of this is filed in the radiation oncology EMR.  SIMULATION VERIFICATION:  The patient underwent CT imaging on the treatment unit.  These were carefully aligned to document that the ablative radiation dose would cover the target volume and maximally spare the nearby organs at risk according to the planned distribution.  SPECIAL TREATMENT PROCEDURE: Laura Walker received high dose ablative stereotactic body radiotherapy to the planned target volume without unforeseen complications. Treatment was delivered uneventfully. The high doses associated with stereotactic body radiotherapy and the significant potential risks require careful treatment set up and patient monitoring constituting a special treatment procedure   STEREOTACTIC TREATMENT MANAGEMENT:  Following delivery, the patient was evaluated clinically. The patient tolerated treatment without significant acute effects, and was discharged to home in stable condition.    PLAN: Continue treatment as planned.  ________________________________  Blair Promise, PhD, MD  This document serves as a record of services personally performed by Gery Pray, MD. It was created on his behalf by Clerance Lav, a trained medical scribe. The creation of  this record is based on the scribe's personal observations and the provider's statements to them. This document has been checked and approved by the attending provider.

## 2020-03-08 ENCOUNTER — Other Ambulatory Visit: Payer: Self-pay

## 2020-03-08 ENCOUNTER — Ambulatory Visit
Admission: RE | Admit: 2020-03-08 | Discharge: 2020-03-08 | Disposition: A | Discharge status: Home / Self Care | Payer: PPO | Source: Ambulatory Visit | Attending: Radiation Oncology | Admitting: Radiation Oncology

## 2020-03-08 DIAGNOSIS — C3491 Malignant neoplasm of unspecified part of right bronchus or lung: Secondary | ICD-10-CM

## 2020-03-08 DIAGNOSIS — Z510 Encounter for antineoplastic radiation therapy: Secondary | ICD-10-CM | POA: Diagnosis not present

## 2020-03-09 ENCOUNTER — Ambulatory Visit: Payer: PPO | Admitting: Radiation Oncology

## 2020-03-10 ENCOUNTER — Other Ambulatory Visit: Payer: Self-pay

## 2020-03-10 ENCOUNTER — Encounter: Payer: Self-pay | Admitting: Radiation Oncology

## 2020-03-10 ENCOUNTER — Ambulatory Visit
Admission: RE | Admit: 2020-03-10 | Discharge: 2020-03-10 | Disposition: A | Discharge status: Home / Self Care | Payer: PPO | Source: Ambulatory Visit | Attending: Radiation Oncology | Admitting: Radiation Oncology

## 2020-03-10 DIAGNOSIS — Z510 Encounter for antineoplastic radiation therapy: Secondary | ICD-10-CM | POA: Diagnosis not present

## 2020-03-10 DIAGNOSIS — C3411 Malignant neoplasm of upper lobe, right bronchus or lung: Secondary | ICD-10-CM | POA: Diagnosis not present

## 2020-03-13 NOTE — Progress Notes (Signed)
  Patient Name: Laura Walker MRN: [id number] DOB: 07/20/1930 Referring Physician: Curt Bears (Profile Not Attached) Date of Service: 03/10/2020 [facility], [place]                                                        End Of Treatment Note  Diagnoses: C34.11-Malignant neoplasm of upper lobe, right bronchus or lung  Cancer Staging: Stage Ib (T2 a, N0, M0) non-small cell lung cancer, adenocarcinoma presented with right upper lobe lung mass  Intent: Curative  Radiation Treatment Dates: 03/01/2020 through 03/10/2020 Site Technique Total Dose (Gy) Dose per Fx (Gy) Completed Fx Beam Energies  Lung, Right: Lung_Rt IMRT 50/50 10 5/5 6XFFF   Narrative: The patient tolerated radiation therapy relatively well. The patient's daughter stated that the patient appeared a big more fatigued, but had no other radiation-associated side effects.  Plan: The patient will follow-up with radiation oncology in one month.  ________________________________________________   Blair Promise, PhD, MD  This document serves as a record of services personally performed by Gery Pray, MD. It was created on his behalf by Clerance Lav, a trained medical scribe. The creation of this record is based on the scribe's personal observations and the provider's statements to them. This document has been checked and approved by the attending provider.

## 2020-03-15 ENCOUNTER — Encounter: Payer: Self-pay | Admitting: Radiation Oncology

## 2020-03-15 DIAGNOSIS — M170 Bilateral primary osteoarthritis of knee: Secondary | ICD-10-CM | POA: Diagnosis not present

## 2020-03-15 DIAGNOSIS — E211 Secondary hyperparathyroidism, not elsewhere classified: Secondary | ICD-10-CM | POA: Diagnosis not present

## 2020-03-15 DIAGNOSIS — I4891 Unspecified atrial fibrillation: Secondary | ICD-10-CM | POA: Diagnosis not present

## 2020-03-15 DIAGNOSIS — H9190 Unspecified hearing loss, unspecified ear: Secondary | ICD-10-CM | POA: Diagnosis not present

## 2020-03-15 DIAGNOSIS — I129 Hypertensive chronic kidney disease with stage 1 through stage 4 chronic kidney disease, or unspecified chronic kidney disease: Secondary | ICD-10-CM | POA: Diagnosis not present

## 2020-03-15 DIAGNOSIS — Z87891 Personal history of nicotine dependence: Secondary | ICD-10-CM | POA: Diagnosis not present

## 2020-03-15 DIAGNOSIS — F0390 Unspecified dementia without behavioral disturbance: Secondary | ICD-10-CM | POA: Diagnosis not present

## 2020-03-15 DIAGNOSIS — M8580 Other specified disorders of bone density and structure, unspecified site: Secondary | ICD-10-CM | POA: Diagnosis not present

## 2020-03-15 DIAGNOSIS — N183 Chronic kidney disease, stage 3 unspecified: Secondary | ICD-10-CM | POA: Diagnosis not present

## 2020-03-15 DIAGNOSIS — G4733 Obstructive sleep apnea (adult) (pediatric): Secondary | ICD-10-CM | POA: Diagnosis not present

## 2020-03-15 DIAGNOSIS — R918 Other nonspecific abnormal finding of lung field: Secondary | ICD-10-CM | POA: Diagnosis not present

## 2020-03-15 DIAGNOSIS — E7800 Pure hypercholesterolemia, unspecified: Secondary | ICD-10-CM | POA: Diagnosis not present

## 2020-03-15 DIAGNOSIS — E559 Vitamin D deficiency, unspecified: Secondary | ICD-10-CM | POA: Diagnosis not present

## 2020-03-15 DIAGNOSIS — Z9181 History of falling: Secondary | ICD-10-CM | POA: Diagnosis not present

## 2020-03-15 DIAGNOSIS — R296 Repeated falls: Secondary | ICD-10-CM | POA: Diagnosis not present

## 2020-03-15 DIAGNOSIS — F329 Major depressive disorder, single episode, unspecified: Secondary | ICD-10-CM | POA: Diagnosis not present

## 2020-03-17 ENCOUNTER — Encounter: Payer: Self-pay | Admitting: *Deleted

## 2020-03-17 NOTE — Progress Notes (Signed)
Oncology Nurse Navigator Documentation  Oncology Nurse Navigator Flowsheets 03/17/2020  Abnormal Finding Date 01/06/2020  Confirmed Diagnosis Date 02/02/2020  Diagnosis Status Confirmed Diagnosis Complete  Planned Course of Treatment Radiation  Phase of Treatment Radiation  Radiation Actual Start Date: 03/03/2020  Radiation Expected End Date: 03/10/2020  Radiation Actual End Date: 03/10/2020  Navigator Follow Up Date: 03/17/2020  Navigator Follow Up Reason: Appointment Review/Treatment is complete on observation at this time with follow up appts in place.   Navigation Complete Date: 03/17/2020  Post Navigation: Continue to Follow Patient? No  Reason Not Navigating Patient: No Treatment, Observation Only  Navigator Location [facility]  Referral Date to RadOnc/MedOnc -  Navigator Encounter Type Other:  Telephone -  [facility] Date -  Multidisiplinary Clinic Type -  Treatment Initiated Date 03/03/2020  Patient Visit Type -  Treatment Phase Post-Tx Follow-up  Barriers/Navigation Needs -  Education -  Interventions None Required  Acuity Level 1-No Barriers  Coordination of Care -  Education Method -  Time Spent with Patient 30

## 2020-03-21 DIAGNOSIS — F329 Major depressive disorder, single episode, unspecified: Secondary | ICD-10-CM | POA: Diagnosis not present

## 2020-03-21 DIAGNOSIS — Z87891 Personal history of nicotine dependence: Secondary | ICD-10-CM | POA: Diagnosis not present

## 2020-03-21 DIAGNOSIS — N183 Chronic kidney disease, stage 3 unspecified: Secondary | ICD-10-CM | POA: Diagnosis not present

## 2020-03-21 DIAGNOSIS — I4891 Unspecified atrial fibrillation: Secondary | ICD-10-CM | POA: Diagnosis not present

## 2020-03-21 DIAGNOSIS — I129 Hypertensive chronic kidney disease with stage 1 through stage 4 chronic kidney disease, or unspecified chronic kidney disease: Secondary | ICD-10-CM | POA: Diagnosis not present

## 2020-03-21 DIAGNOSIS — F0390 Unspecified dementia without behavioral disturbance: Secondary | ICD-10-CM | POA: Diagnosis not present

## 2020-03-21 DIAGNOSIS — H9190 Unspecified hearing loss, unspecified ear: Secondary | ICD-10-CM | POA: Diagnosis not present

## 2020-03-21 DIAGNOSIS — E7800 Pure hypercholesterolemia, unspecified: Secondary | ICD-10-CM | POA: Diagnosis not present

## 2020-03-21 DIAGNOSIS — R296 Repeated falls: Secondary | ICD-10-CM | POA: Diagnosis not present

## 2020-03-21 DIAGNOSIS — G4733 Obstructive sleep apnea (adult) (pediatric): Secondary | ICD-10-CM | POA: Diagnosis not present

## 2020-03-21 DIAGNOSIS — Z9181 History of falling: Secondary | ICD-10-CM | POA: Diagnosis not present

## 2020-03-21 DIAGNOSIS — R918 Other nonspecific abnormal finding of lung field: Secondary | ICD-10-CM | POA: Diagnosis not present

## 2020-03-21 DIAGNOSIS — M170 Bilateral primary osteoarthritis of knee: Secondary | ICD-10-CM | POA: Diagnosis not present

## 2020-03-21 DIAGNOSIS — M8580 Other specified disorders of bone density and structure, unspecified site: Secondary | ICD-10-CM | POA: Diagnosis not present

## 2020-03-21 DIAGNOSIS — E559 Vitamin D deficiency, unspecified: Secondary | ICD-10-CM | POA: Diagnosis not present

## 2020-03-21 DIAGNOSIS — E211 Secondary hyperparathyroidism, not elsewhere classified: Secondary | ICD-10-CM | POA: Diagnosis not present

## 2020-03-29 ENCOUNTER — Encounter: Payer: Self-pay | Admitting: Radiation Oncology

## 2020-04-11 NOTE — Progress Notes (Signed)
Patient presents today for a 1 month f/u after EOT with Dr. Sondra Come.  Pain: none  Fatigue: ongoing  Cough:  occasional  Shortness of breath: yes  Pain or problems swallowing? No  Skin irritation:No  Also getting chemo/immunotherapy? Last tx? None   BP (!) 114/4 (BP Location: Left Arm, Patient Position: Sitting)   Pulse 86   Temp (!) 97.5 F (36.4 C) (Temporal)   Resp 18   Ht 5\' 4"  (1.626 m)   SpO2 100%   BMI 44.63 kg/m   Wt Readings from Last 3 Encounters:  02/18/20 260 lb (117.9 kg)  02/02/20 225 lb (102.1 kg)  01/31/20 225 lb (102.1 kg)

## 2020-04-12 NOTE — Progress Notes (Signed)
Radiation Oncology         [phone] ________________________________  Name: Laura Walker MRN: [id number]  Date: 04/13/2020  DOB: 10/07/1930  Follow-Up Visit Note  CC: [name], MD  Curt Bears, MD    ICD-10-CM   1. Adenocarcinoma of right lung, stage 1 (HCC)  C34.91     Diagnosis: Stage Ib (T2 a, N0, M0) non-small cell lung cancer, adenocarcinoma presented with right upper lobe lung mass  Interval Since Last Radiation: One month and four days.  Radiation Treatment Dates: 03/01/2020 through 03/10/2020 Site Technique Total Dose (Gy) Dose per Fx (Gy) Completed Fx Beam Energies  Lung, Right: Lung_Rt IMRT 50/50 10 5/5 6XFFF    Narrative:  The patient returns today for routine follow-up.  No significant interval history since the end of treatment.   On review of systems, she reports ongoing fatigue, occasional cough, and shortness of breath. She denies pain, skin irritation, and difficulty swallowing.                           ALLERGIES:  is allergic to tramadol.  Meds: Current Outpatient Medications  Medication Sig Dispense Refill  . apixaban (ELIQUIS) 5 MG TABS tablet Take 1 tablet (5 mg total) by mouth 2 (two) times daily. 60 tablet 11  . carboxymethylcellulose (REFRESH TEARS) 0.5 % SOLN Place 1 drop into both eyes 2 (two) times daily as needed (dry eyes).    . Cholecalciferol (VITAMIN D) 50 MCG (2000 UT) tablet Take 2,000 Units by mouth daily.    . ciclopirox (PENLAC) 8 % solution Apply 1 drop topically daily. On toenails  4  . clotrimazole-betamethasone (LOTRISONE) cream Apply 1 application topically daily as needed (fungus).    Marland Kitchen donepezil (ARICEPT) 10 MG tablet TAKE 1 TABLET BY MOUTH AT BEDTIME 30 tablet 0  . DULoxetine (CYMBALTA) 30 MG capsule Take 30 mg by mouth daily.    . meloxicam (MOBIC) 7.5 MG tablet Take 15 mg by mouth daily in the afternoon.   4  . memantine (NAMENDA) 10 MG tablet Take 1 tablet by mouth twice daily 60 tablet 0  .  Naphazoline-Pheniramine (OPCON-A) 0.027-0.315 % SOLN Place 1 drop into both eyes daily as needed (allergies).    . Omega-3 Fatty Acids (FISH OIL ULTRA) 1400 MG CAPS Take 1,400 mg by mouth daily.    Marland Kitchen oxybutynin (DITROPAN-XL) 5 MG 24 hr tablet Take 5 mg by mouth daily in the afternoon.    . Red Yeast Rice 600 MG CAPS Take 600 mg by mouth daily in the afternoon.    . triamcinolone cream (KENALOG) 0.1 % Apply 1 application topically daily as needed (dry skin).    . vitamin B-12 (CYANOCOBALAMIN) 1000 MCG tablet Take 1,000 mcg by mouth daily.     No current facility-administered medications for this encounter.    Physical Findings: The patient is in no acute distress. Patient is alert and oriented.  height is 5\' 4"  (1.626 m). Her temporal temperature is 97.5 F (36.4 C) (abnormal). Her blood pressure is 114/4 (abnormal) and her pulse is 86. Her respiration is 18 and oxygen saturation is 100%.   Lungs are clear to auscultation bilaterally. Heart has regular rate and rhythm. No palpable cervical, supraclavicular, or axillary adenopathy. Abdomen soft, non-tender, normal bowel sounds.   Lab Findings: Lab Results  Component Value Date   WBC 8.6 01/31/2020   HGB 13.5 01/31/2020   HCT 43.6 01/31/2020   MCV 92.0 01/31/2020  PLT 251 01/31/2020    Radiographic Findings: No results found.  Impression: Stage Ib (T2 a, N0, M0) non-small cell lung cancer, adenocarcinoma presented with right upper lobe lung mass  The patient is recovering from the effects of radiation. She has experienced some fatigue with her stereotactic body radiation therapy but seems to be slowly improving from this issue.  Plan: The patient is scheduled to see Dr. Julien Nordmann on 06/07/2020. Follow-up with radiation oncology on the same day if possible.  prior to this appointment the patient will undergo a chest CT scan.  ____________________________________   Blair Promise, PhD, MD  This document serves as a record of  services personally performed by Gery Pray, MD. It was created on his behalf by Clerance Lav, a trained medical scribe. The creation of this record is based on the scribe's personal observations and the Tanelle Lanzo's statements to them. This document has been checked and approved by the attending Wynell Halberg.

## 2020-04-13 ENCOUNTER — Ambulatory Visit
Admission: RE | Admit: 2020-04-13 | Discharge: 2020-04-13 | Disposition: A | Discharge status: Home / Self Care | Payer: PPO | Source: Ambulatory Visit | Attending: Radiation Oncology | Admitting: Radiation Oncology

## 2020-04-13 ENCOUNTER — Encounter: Payer: Self-pay | Admitting: Radiation Oncology

## 2020-04-13 ENCOUNTER — Other Ambulatory Visit: Payer: Self-pay

## 2020-04-13 VITALS — BP 114/4 | HR 86 | Temp 97.5°F | Resp 18 | Ht 64.0 in

## 2020-04-13 DIAGNOSIS — Z923 Personal history of irradiation: Secondary | ICD-10-CM | POA: Diagnosis not present

## 2020-04-13 DIAGNOSIS — C3411 Malignant neoplasm of upper lobe, right bronchus or lung: Secondary | ICD-10-CM | POA: Insufficient documentation

## 2020-04-13 DIAGNOSIS — C3491 Malignant neoplasm of unspecified part of right bronchus or lung: Secondary | ICD-10-CM

## 2020-04-13 DIAGNOSIS — Z79899 Other long term (current) drug therapy: Secondary | ICD-10-CM | POA: Insufficient documentation

## 2020-04-13 DIAGNOSIS — Z7901 Long term (current) use of anticoagulants: Secondary | ICD-10-CM | POA: Diagnosis not present

## 2020-04-17 NOTE — Telephone Encounter (Signed)
Unfortunately, once a day dosing of Eliquis does not fully protect against stroke.  Would recommend twice a day dosing with regular blood work, CBC to make sure there is no significant anemia.   - Please check a CBC  Candee Furbish, MD

## 2020-04-20 ENCOUNTER — Encounter: Payer: Self-pay | Admitting: Orthopedic Surgery

## 2020-04-20 ENCOUNTER — Ambulatory Visit (INDEPENDENT_AMBULATORY_CARE_PROVIDER_SITE_OTHER): Payer: PPO | Admitting: Orthopedic Surgery

## 2020-04-20 ENCOUNTER — Other Ambulatory Visit: Payer: Self-pay

## 2020-04-20 DIAGNOSIS — I4891 Unspecified atrial fibrillation: Secondary | ICD-10-CM | POA: Diagnosis not present

## 2020-04-20 DIAGNOSIS — I700 Atherosclerosis of aorta: Secondary | ICD-10-CM | POA: Diagnosis not present

## 2020-04-20 DIAGNOSIS — M170 Bilateral primary osteoarthritis of knee: Secondary | ICD-10-CM

## 2020-04-20 DIAGNOSIS — H60511 Acute actinic otitis externa, right ear: Secondary | ICD-10-CM | POA: Diagnosis not present

## 2020-04-20 DIAGNOSIS — C3490 Malignant neoplasm of unspecified part of unspecified bronchus or lung: Secondary | ICD-10-CM | POA: Diagnosis not present

## 2020-04-20 DIAGNOSIS — Z0000 Encounter for general adult medical examination without abnormal findings: Secondary | ICD-10-CM | POA: Diagnosis not present

## 2020-04-20 DIAGNOSIS — D6859 Other primary thrombophilia: Secondary | ICD-10-CM | POA: Diagnosis not present

## 2020-04-20 DIAGNOSIS — H6121 Impacted cerumen, right ear: Secondary | ICD-10-CM | POA: Diagnosis not present

## 2020-04-20 DIAGNOSIS — R7301 Impaired fasting glucose: Secondary | ICD-10-CM | POA: Diagnosis not present

## 2020-04-20 DIAGNOSIS — N2581 Secondary hyperparathyroidism of renal origin: Secondary | ICD-10-CM | POA: Diagnosis not present

## 2020-04-20 DIAGNOSIS — E559 Vitamin D deficiency, unspecified: Secondary | ICD-10-CM | POA: Diagnosis not present

## 2020-04-20 DIAGNOSIS — E785 Hyperlipidemia, unspecified: Secondary | ICD-10-CM | POA: Diagnosis not present

## 2020-04-20 DIAGNOSIS — Z79899 Other long term (current) drug therapy: Secondary | ICD-10-CM | POA: Diagnosis not present

## 2020-04-20 MED ORDER — METHYLPREDNISOLONE ACETATE 40 MG/ML IJ SUSP
40.0000 mg | INTRAMUSCULAR | Status: AC | PRN
  Administered 2020-04-20: 40 mg via INTRA_ARTICULAR

## 2020-04-20 MED ORDER — LIDOCAINE HCL 1 % IJ SOLN
5.0000 mL | INTRAMUSCULAR | Status: AC | PRN
  Administered 2020-04-20: 5 mL

## 2020-04-20 MED ORDER — METHYLPREDNISOLONE ACETATE 40 MG/ML IJ SUSP
40.0000 mg | INTRAMUSCULAR | Status: AC | PRN
Start: 2020-04-20 — End: 2020-04-20
  Administered 2020-04-20: 40 mg via INTRA_ARTICULAR

## 2020-04-20 MED ORDER — BUPIVACAINE HCL 0.25 % IJ SOLN
4.0000 mL | INTRAMUSCULAR | Status: AC | PRN
  Administered 2020-04-20: 4 mL via INTRA_ARTICULAR

## 2020-04-20 NOTE — Progress Notes (Signed)
Office Visit Note   Patient: Laura Walker           Date of Birth: [birth year]           MRN: [id number] Visit Date: 04/20/2020 Requested by: Laura Jordan, MD [address] [place],  [place] [address] PCP: Laura Jordan, MD  Subjective: Chief Complaint  Patient presents with  . Right Knee - Pain  . Left Knee - Pain    HPI: Laura Walker is an 86 year old patient with bilateral knee arthritis right worse than left.  She is here with Laura Walker her daytime caregiver.  In general the patient is still living at home but is becoming less active.              ROS: All systems reviewed are negative as they relate to the chief complaint within the history of present illness.  Patient denies  fevers or chills.   Assessment & Plan: Visit Diagnoses:  1. Bilateral primary osteoarthritis of knee     Plan: Impression is bilateral knee arthritis right worse than left with diminishing activity.  Plan is bilateral cortisone knee injections today.  Aspirated the left knee as well.  Follow-up as needed.  Continue with as much activity as the patient can tolerate in terms of ambulating and getting out of bed.  Follow-Up Instructions: Return if symptoms worsen or fail to improve.   Orders:  No orders of the defined types were placed in this encounter.  No orders of the defined types were placed in this encounter.     Procedures: Large Joint Inj: bilateral knee on 04/20/2020 9:57 AM Indications: diagnostic evaluation, joint swelling and pain Details: 18 G 1.5 in needle, superolateral approach  Arthrogram: No  Medications (Right): 5 mL lidocaine 1 %; 4 mL bupivacaine 0.25 %; 40 mg methylPREDNISolone acetate 40 MG/ML Medications (Left): 5 mL lidocaine 1 %; 4 mL bupivacaine 0.25 %; 40 mg methylPREDNISolone acetate 40 MG/ML Outcome: tolerated well, no immediate complications Procedure, treatment alternatives, risks and benefits explained, specific risks discussed. Consent was given  by the patient. Immediately prior to procedure a time out was called to verify the correct patient, procedure, equipment, support staff and site/side marked as required. Patient was prepped and draped in the usual sterile fashion.       Clinical Data: No additional findings.  Objective: Vital Signs: There were no vitals taken for this visit.  Physical Exam:   Constitutional: Patient appears well-developed HEENT:  Head: Normocephalic Eyes:EOM are normal Neck: Normal range of motion Cardiovascular: Normal rate Pulmonary/chest: Effort normal Neurologic: Patient is alert Skin: Skin is warm Psychiatric: Patient has normal mood and affect    Ortho Exam: Ortho exam demonstrates valgus alignment bilateral lower extremities right worse than left.  Mild effusion left knee no effusion right knee.  Patient does have full extension and flexion past 90 in both knees.  No calf tenderness.  No masses lymphadenopathy or skin changes noted in bilateral knee regions.  Specialty Comments:  No specialty comments available.  Imaging: No results found.   PMFS History: Patient Active Problem List   Diagnosis Date Noted  . Adenocarcinoma of right lung, stage 1 ([facility]) 02/07/2020  . Left bundle branch block 02/02/2020  . Persistent atrial fibrillation ([place]) 02/02/2020  . Lung mass 01/06/2020  . Mild dementia ([place]) 08/24/2018   Past Medical History:  Diagnosis Date  . Anxiety   . Arthritis   . CKD (chronic kidney disease)   . Dementia ([facility])   .  Depression   . Dysrhythmia    new onset AFIB.   Marland Kitchen Hypercholesteremia   . Memory loss   . OSA (obstructive sleep apnea)    CPAP  . Osteopenia     Family History  Problem Relation Age of Onset  . Cancer - Lung Mother   . Heart disease Father   . Atrial fibrillation Sister     Past Surgical History:  Procedure Laterality Date  . ANKLE SURGERY Left    as child, fracture  . BUNIONECTOMY    . VIDEO BRONCHOSCOPY WITH ENDOBRONCHIAL NAVIGATION  N/A 02/02/2020   Procedure: VIDEO BRONCHOSCOPY WITH ENDOBRONCHIAL NAVIGATION;  Surgeon: Melrose Nakayama, MD;  Location: [facility];  Service: Thoracic;  Laterality: N/A;   Social History   Occupational History    Comment: retired sub teacher/tutor  Tobacco Use  . Smoking status: Former Smoker    Quit date: 12/13/1984    Years since quitting: 35.3  . Smokeless tobacco: Never Used  Substance and Sexual Activity  . Alcohol use: Yes    Comment: occas wine  . Drug use: No  . Sexual activity: Not on file

## 2020-05-03 NOTE — Progress Notes (Signed)
Cardiology Office Note   Date:  05/05/2020   ID:  [name], DOB 1930-09-28, MRN [id number]  PCP:  [name], MD  Cardiologist:  Dr. Marlou Porch, MD   Chief Complaint  [name] presents with  . Follow-up  . Atrial Fibrillation    History of Present Illness: Laura [name] is a 86 y.o. female who presents for follow up, seen for Dr. Marlou Porch.   Laura [name] has a hx of atrial fibrillation, initially dx during preoperative evaluation by Dr. Skipper Cliche in 12/2019. She presented to [facility] ED after acute metal status change and fall, found to be in AF with controlled rate at 70bpm. CT scan at that time showed a lung mass and she was referred for bronchoscopy. She has a successful bronchoscopy per Dr. Roxan Hockey. Unfortunately, pathology results came back positive for non-small cell carcinoma in RUL.   She saw Dr. Earlie Server 02/07/20 who recommended radiation oncology.   She was last seen by Dr. Marlou Porch 02/18/2020 at which time she was started on Eliquis 5mg  PO BID if ok with oncology. EKG with LBBB. Dr. Marlou Porch mentions possible echocardiogram at follow up  Today Laura [name] is here with her caregiver and HPI performed with Laura assistance of caregiver and daughter over Laura phone.  She has had no complaints of chest pain, palpitations, LE edema, dizziness or syncope. She has had no recent falls.  Does have chronic shortness of breath secondary to lung CA.  Daughter reports after Laura initiation of Eliquis [name] began having lower and upper extremity blisters which were concerning to them.  Recently underwent lab work through PCP which was reviewed.  Hemoglobin stable at 13.6.  Hemoglobin A1c 5.5.  TSH within normal limits.  Creatinine was not performed.   Past Medical History:  Diagnosis Date  . Anxiety   . Arthritis   . CKD (chronic kidney disease)   . Dementia ([place])   . Depression   . Dysrhythmia    new onset AFIB.   Marland Kitchen Hypercholesteremia   . Memory loss   . OSA (obstructive sleep  apnea)    CPAP  . Osteopenia     Past Surgical History:  Procedure Laterality Date  . ANKLE SURGERY Left    as child, fracture  . BUNIONECTOMY    . VIDEO BRONCHOSCOPY WITH ENDOBRONCHIAL NAVIGATION N/A 02/02/2020   Procedure: VIDEO BRONCHOSCOPY WITH ENDOBRONCHIAL NAVIGATION;  Surgeon: Melrose Nakayama, MD;  [facility]: [facility] OR;  Service: Thoracic;  Laterality: N/A;     Current Outpatient Medications  Medication Sig Dispense Refill  . apixaban (ELIQUIS) 5 MG TABS tablet Take 1 tablet (5 mg total) by mouth 2 (two) times daily. 60 tablet 11  . carboxymethylcellulose (REFRESH TEARS) 0.5 % SOLN Place 1 drop into both eyes 2 (two) times daily as needed (dry eyes).    . Cholecalciferol (VITAMIN D) 50 MCG (2000 UT) tablet Take 2,000 Units by mouth daily.    . ciclopirox (PENLAC) 8 % solution Apply 1 drop topically daily. On toenails  4  . clotrimazole-betamethasone (LOTRISONE) cream Apply 1 application topically daily as needed (fungus).    Marland Kitchen donepezil (ARICEPT) 10 MG tablet TAKE 1 TABLET BY MOUTH AT BEDTIME 30 tablet 0  . DULoxetine (CYMBALTA) 30 MG capsule Take 30 mg by mouth daily.    . meloxicam (MOBIC) 7.5 MG tablet Take 15 mg by mouth daily in Laura afternoon.   4  . memantine (NAMENDA) 10 MG tablet Take 1 tablet by mouth twice daily  60 tablet 0  . Naphazoline-Pheniramine (OPCON-A) 0.027-0.315 % SOLN Place 1 drop into both eyes daily as needed (allergies).    . Omega-3 Fatty Acids (FISH OIL ULTRA) 1400 MG CAPS Take 1,400 mg by mouth daily.    Marland Kitchen oxybutynin (DITROPAN-XL) 5 MG 24 hr tablet Take 5 mg by mouth daily in Laura afternoon.    . Red Yeast Rice 600 MG CAPS Take 600 mg by mouth daily in Laura afternoon.    . triamcinolone cream (KENALOG) 0.1 % Apply 1 application topically daily as needed (dry skin).    . vitamin B-12 (CYANOCOBALAMIN) 1000 MCG tablet Take 1,000 mcg by mouth daily.     No current facility-administered medications for this visit.    Allergies:   Tramadol    Social  History:  Laura [name]  reports that she quit smoking about 35 years ago. She has never used smokeless tobacco. She reports current alcohol use. She reports that she does not use drugs.   Family History:  Laura [name]'s family history includes Atrial fibrillation in her sister; Cancer - Lung in her mother; Heart disease in her father.    ROS:  Please see Laura history of present illness.   Otherwise, review of systems are positive for none.   All other systems are reviewed and negative.    PHYSICAL EXAM: VS:  BP 110/84   Pulse 87   Ht 5\' 4"  (1.626 m)   Wt 211 lb 12.8 oz (96.1 kg)   SpO2 97%   BMI 36.36 kg/m  , BMI Body mass index is 36.36 kg/m.   General: Elderly, HOH, NAD Skin: Warm, dry, intact .  Ecchymosis on upper and lower extremities. Neck: Negative for carotid bruits. No JVD Lungs:Clear to ausculation bilaterally. No wheezes, rales, or rhonchi. Breathing is unlabored. Cardiovascular: RRR with S1 S2. + murmur Extremities: No edema.  Radial pulses 2+ bilaterally Neuro: Alert and oriented. No focal deficits. No facial asymmetry. MAE spontaneously. Psych: Responds to questions appropriately with normal affect.     EKG:  EKG is not ordered today.   Recent Labs: 01/31/2020: ALT 13; BUN 24; Creatinine, Ser 0.91; Hemoglobin 13.5; Platelets 251; Potassium 4.3; Sodium 138    Lipid Panel No results found for: CHOL, TRIG, HDL, CHOLHDL, VLDL, LDLCALC, LDLDIRECT    Wt Readings from Last 3 Encounters:  05/05/20 211 lb 12.8 oz (96.1 kg)  02/18/20 260 lb (117.9 kg)  02/02/20 225 lb (102.1 kg)      Other studies Reviewed: Additional studies/ records that were reviewed today include:  Review of Laura above records demonstrates:   Echocardiogram scheduled   ASSESSMENT AND PLAN:  1. Persistent atrial fibrillation: -Rate controlled>>rate stable on auscultation -Started on Eliquis at last OV 01/2020>> with blistering on lower and upper extremities which have resolved -CBC from PCP  04/20/2020 hemoglobin stable at 13.6 -Daughter would prefer no further lab work at this time -Continue current Eliquis dosing at 5 mg p.o. twice daily  2. LBBB: -Noted on EKG>>plan to check echocardiogram  -Echocardiogram scheduled -No complaints of chest pain per caregiver/daughter  3. Chronic anticoagulation: -Continue Eliquis 5mg  PO BID -BUN/Creatinine>>> stable from 01/31/2020 at 0.91 -Daughter prefers no further lab work today  4. Lung CA: -Recent Dx>>>follows with Dr. Earlie Server  -Underwent radiation    Current medicines are reviewed at length with Laura [name] today.  Laura [name] does not have concerns regarding medicines.  Laura following changes have been made:  no change  Labs/ tests ordered today include: Echocardiogram  No orders of Laura defined types were placed in this encounter.    Disposition:   FU with Dr. Marlou Porch in 6 months  Signed, Kathyrn Drown, NP  05/05/2020 12:28 PM    [address], [place], [place]  [address] Phone: [phone]; Fax: [phone]

## 2020-05-05 ENCOUNTER — Other Ambulatory Visit: Payer: Self-pay

## 2020-05-05 ENCOUNTER — Ambulatory Visit (INDEPENDENT_AMBULATORY_CARE_PROVIDER_SITE_OTHER): Payer: PPO | Admitting: Cardiology

## 2020-05-05 ENCOUNTER — Encounter: Payer: Self-pay | Admitting: Cardiology

## 2020-05-05 VITALS — BP 110/84 | HR 87 | Ht 64.0 in | Wt 211.8 lb

## 2020-05-05 DIAGNOSIS — I4819 Other persistent atrial fibrillation: Secondary | ICD-10-CM | POA: Diagnosis not present

## 2020-05-05 DIAGNOSIS — I447 Left bundle-branch block, unspecified: Secondary | ICD-10-CM | POA: Diagnosis not present

## 2020-05-05 NOTE — Patient Instructions (Signed)
Medication Instructions:   Your physician recommends that you continue on your current medications as directed. Please refer to the Current Medication list given to you today.  *If you need a refill on your cardiac medications before your next appointment, please call your pharmacy*  Lab Work:  None ordered today  Testing/Procedures:  Your physician has requested that you have an echocardiogram. Echocardiography is a painless test that uses sound waves to create images of your heart. It provides your doctor with information about the size and shape of your heart and how well your heart's chambers and valves are working. This procedure takes approximately one hour. There are no restrictions for this procedure.  Follow-Up: At [facility], you and your health needs are our priority.  As part of our continuing mission to provide you with exceptional heart care, we have created designated Provider Care Teams.  These Care Teams include your primary Cardiologist (physician) and Advanced Practice Providers (APPs -  Physician Assistants and Nurse Practitioners) who all work together to provide you with the care you need, when you need it.  We recommend signing up for the patient portal called "MyChart".  Sign up information is provided on this After Visit Summary.  MyChart is used to connect with patients for Virtual Visits (Telemedicine).  Patients are able to view lab/test results, encounter notes, upcoming appointments, etc.  Non-urgent messages can be sent to your provider as well.   To learn more about what you can do with MyChart, go to [email or web address].    Your next appointment:   6 month(s)  The format for your next appointment:   In Person  Provider:   Candee Furbish, MD

## 2020-05-23 ENCOUNTER — Encounter: Payer: Self-pay | Admitting: *Deleted

## 2020-05-31 ENCOUNTER — Ambulatory Visit (HOSPITAL_COMMUNITY): Payer: PPO | Attending: Cardiology

## 2020-05-31 ENCOUNTER — Other Ambulatory Visit: Payer: Self-pay

## 2020-05-31 DIAGNOSIS — I447 Left bundle-branch block, unspecified: Secondary | ICD-10-CM | POA: Diagnosis not present

## 2020-05-31 DIAGNOSIS — I4819 Other persistent atrial fibrillation: Secondary | ICD-10-CM | POA: Diagnosis not present

## 2020-06-05 ENCOUNTER — Encounter (HOSPITAL_COMMUNITY): Payer: Self-pay

## 2020-06-05 ENCOUNTER — Ambulatory Visit (HOSPITAL_COMMUNITY)
Admission: RE | Admit: 2020-06-05 | Discharge: 2020-06-05 | Disposition: A | Discharge status: Home / Self Care | Payer: PPO | Source: Ambulatory Visit | Attending: Internal Medicine | Admitting: Internal Medicine

## 2020-06-05 ENCOUNTER — Other Ambulatory Visit: Payer: Self-pay

## 2020-06-05 ENCOUNTER — Other Ambulatory Visit: Payer: PPO

## 2020-06-05 ENCOUNTER — Inpatient Hospital Stay: Payer: PPO | Attending: Internal Medicine

## 2020-06-05 DIAGNOSIS — K449 Diaphragmatic hernia without obstruction or gangrene: Secondary | ICD-10-CM | POA: Insufficient documentation

## 2020-06-05 DIAGNOSIS — C3490 Malignant neoplasm of unspecified part of unspecified bronchus or lung: Secondary | ICD-10-CM | POA: Diagnosis not present

## 2020-06-05 DIAGNOSIS — I2510 Atherosclerotic heart disease of native coronary artery without angina pectoris: Secondary | ICD-10-CM | POA: Diagnosis not present

## 2020-06-05 DIAGNOSIS — Z791 Long term (current) use of non-steroidal anti-inflammatories (NSAID): Secondary | ICD-10-CM | POA: Insufficient documentation

## 2020-06-05 DIAGNOSIS — I700 Atherosclerosis of aorta: Secondary | ICD-10-CM | POA: Diagnosis not present

## 2020-06-05 DIAGNOSIS — N189 Chronic kidney disease, unspecified: Secondary | ICD-10-CM | POA: Diagnosis not present

## 2020-06-05 DIAGNOSIS — C3411 Malignant neoplasm of upper lobe, right bronchus or lung: Secondary | ICD-10-CM | POA: Diagnosis not present

## 2020-06-05 DIAGNOSIS — J929 Pleural plaque without asbestos: Secondary | ICD-10-CM | POA: Diagnosis not present

## 2020-06-05 DIAGNOSIS — F418 Other specified anxiety disorders: Secondary | ICD-10-CM | POA: Diagnosis not present

## 2020-06-05 DIAGNOSIS — Z923 Personal history of irradiation: Secondary | ICD-10-CM | POA: Diagnosis not present

## 2020-06-05 DIAGNOSIS — M8580 Other specified disorders of bone density and structure, unspecified site: Secondary | ICD-10-CM | POA: Diagnosis not present

## 2020-06-05 DIAGNOSIS — G4730 Sleep apnea, unspecified: Secondary | ICD-10-CM | POA: Diagnosis not present

## 2020-06-05 DIAGNOSIS — J984 Other disorders of lung: Secondary | ICD-10-CM | POA: Diagnosis not present

## 2020-06-05 DIAGNOSIS — M129 Arthropathy, unspecified: Secondary | ICD-10-CM | POA: Insufficient documentation

## 2020-06-05 DIAGNOSIS — Z79899 Other long term (current) drug therapy: Secondary | ICD-10-CM | POA: Diagnosis not present

## 2020-06-05 DIAGNOSIS — E7800 Pure hypercholesterolemia, unspecified: Secondary | ICD-10-CM | POA: Diagnosis not present

## 2020-06-05 DIAGNOSIS — Z7901 Long term (current) use of anticoagulants: Secondary | ICD-10-CM | POA: Diagnosis not present

## 2020-06-05 DIAGNOSIS — I4891 Unspecified atrial fibrillation: Secondary | ICD-10-CM | POA: Diagnosis not present

## 2020-06-05 LAB — CBC WITH DIFFERENTIAL (CANCER CENTER ONLY)
Abs Immature Granulocytes: 0.02 10*3/uL (ref 0.00–0.07)
Basophils Absolute: 0.1 10*3/uL (ref 0.0–0.1)
Basophils Relative: 1 %
Eosinophils Absolute: 0.2 10*3/uL (ref 0.0–0.5)
Eosinophils Relative: 3 %
HCT: 45.7 % (ref 36.0–46.0)
Hemoglobin: 14.8 g/dL (ref 12.0–15.0)
Immature Granulocytes: 0 %
Lymphocytes Relative: 17 %
Lymphs Abs: 1.2 10*3/uL (ref 0.7–4.0)
MCH: 30.1 pg (ref 26.0–34.0)
MCHC: 32.4 g/dL (ref 30.0–36.0)
MCV: 92.9 fL (ref 80.0–100.0)
Monocytes Absolute: 0.6 10*3/uL (ref 0.1–1.0)
Monocytes Relative: 9 %
Neutro Abs: 4.9 10*3/uL (ref 1.7–7.7)
Neutrophils Relative %: 70 %
Platelet Count: 238 10*3/uL (ref 150–400)
RBC: 4.92 MIL/uL (ref 3.87–5.11)
RDW: 15.1 % (ref 11.5–15.5)
WBC Count: 7 10*3/uL (ref 4.0–10.5)
nRBC: 0 % (ref 0.0–0.2)

## 2020-06-05 LAB — CMP (CANCER CENTER ONLY)
ALT: 10 U/L (ref 0–44)
AST: 10 U/L — ABNORMAL LOW (ref 15–41)
Albumin: 3.3 g/dL — ABNORMAL LOW (ref 3.5–5.0)
Alkaline Phosphatase: 84 U/L (ref 38–126)
Anion gap: 11 (ref 5–15)
BUN: 20 mg/dL (ref 8–23)
CO2: 24 mmol/L (ref 22–32)
Calcium: 9.8 mg/dL (ref 8.9–10.3)
Chloride: 107 mmol/L (ref 98–111)
Creatinine: 1.01 mg/dL — ABNORMAL HIGH (ref 0.44–1.00)
GFR, Est AFR Am: 58 mL/min — ABNORMAL LOW (ref >60)
GFR, Estimated: 50 mL/min — ABNORMAL LOW (ref >60)
Glucose, Bld: 110 mg/dL — ABNORMAL HIGH (ref 70–99)
Potassium: 4.4 mmol/L (ref 3.5–5.1)
Sodium: 142 mmol/L (ref 135–145)
Total Bilirubin: 0.9 mg/dL (ref 0.3–1.2)
Total Protein: 6.5 g/dL (ref 6.5–8.1)

## 2020-06-05 MED ORDER — SODIUM CHLORIDE (PF) 0.9 % IJ SOLN
INTRAMUSCULAR | Status: AC
  Filled 2020-06-05: qty 50

## 2020-06-05 MED ORDER — IOHEXOL 300 MG/ML  SOLN
75.0000 mL | Freq: Once | INTRAMUSCULAR | Status: AC | PRN
  Administered 2020-06-05: 75 mL via INTRAVENOUS

## 2020-06-07 ENCOUNTER — Inpatient Hospital Stay: Payer: PPO | Admitting: Internal Medicine

## 2020-06-07 ENCOUNTER — Encounter: Payer: Self-pay | Admitting: Internal Medicine

## 2020-06-07 ENCOUNTER — Other Ambulatory Visit: Payer: Self-pay

## 2020-06-07 VITALS — BP 129/99 | HR 84 | Temp 97.7°F | Resp 17 | Ht 64.0 in | Wt 211.9 lb

## 2020-06-07 DIAGNOSIS — C3411 Malignant neoplasm of upper lobe, right bronchus or lung: Secondary | ICD-10-CM | POA: Diagnosis not present

## 2020-06-07 DIAGNOSIS — C3490 Malignant neoplasm of unspecified part of unspecified bronchus or lung: Secondary | ICD-10-CM

## 2020-06-07 DIAGNOSIS — C3491 Malignant neoplasm of unspecified part of right bronchus or lung: Secondary | ICD-10-CM | POA: Diagnosis not present

## 2020-06-07 NOTE — Progress Notes (Signed)
[facility] [phone]   Fax:([phone]) [phone]  OFFICE PROGRESS NOTE  [name], MD [address] [place] [address]  DIAGNOSIS: Stage Ib (T2 a, N0, M0) non-small cell lung cancer, adenocarcinoma presented with right upper lobe lung mass diagnosed in March 2021.  PRIOR THERAPY: Curative SBRT to the right upper lobe lung mass completed March 10, 2020 under the care of Dr. Sondra Come.  CURRENT THERAPY: Observation.  INTERVAL HISTORY: Laura Walker 86 y.o. female returns to the clinic today for follow-up visit accompanied by her 2 daughters.  The patient is feeling fine today with no concerning complaints.  She tolerated her previous SBRT to the right upper lobe lung nodule fairly well.  She denied having any current chest pain, shortness of breath, cough or hemoptysis.  She denied having any fever or chills.  She has no nausea, vomiting, diarrhea or constipation.  She denied having any headache or visual changes.  She had repeat CT scan of the chest performed recently and she is here for evaluation and discussion of her scan results.  MEDICAL HISTORY: Past Medical History:  Diagnosis Date  . Anxiety   . Arthritis   . CKD (chronic kidney disease)   . Dementia ([place])   . Depression   . Dysrhythmia    new onset AFIB.   Marland Kitchen Hypercholesteremia   . Memory loss   . OSA (obstructive sleep apnea)    CPAP  . Osteopenia     ALLERGIES:  is allergic to tramadol.  MEDICATIONS:  Current Outpatient Medications  Medication Sig Dispense Refill  . apixaban (ELIQUIS) 5 MG TABS tablet Take 1 tablet (5 mg total) by mouth 2 (two) times daily. 60 tablet 11  . carboxymethylcellulose (REFRESH TEARS) 0.5 % SOLN Place 1 drop into both eyes 2 (two) times daily as needed (dry eyes).    . Cholecalciferol (VITAMIN D) 50 MCG (2000 UT) tablet Take 2,000 Units by mouth daily.    . ciclopirox (PENLAC) 8 % solution Apply 1 drop topically daily. On toenails  4   . clotrimazole-betamethasone (LOTRISONE) cream Apply 1 application topically daily as needed (fungus).    Marland Kitchen donepezil (ARICEPT) 10 MG tablet TAKE 1 TABLET BY MOUTH AT BEDTIME 30 tablet 0  . DULoxetine (CYMBALTA) 30 MG capsule Take 30 mg by mouth daily.    . meloxicam (MOBIC) 7.5 MG tablet Take 15 mg by mouth daily in the afternoon.   4  . memantine (NAMENDA) 10 MG tablet Take 1 tablet by mouth twice daily 60 tablet 0  . Naphazoline-Pheniramine (OPCON-A) 0.027-0.315 % SOLN Place 1 drop into both eyes daily as needed (allergies).    . Omega-3 Fatty Acids (FISH OIL ULTRA) 1400 MG CAPS Take 1,400 mg by mouth daily.    Marland Kitchen oxybutynin (DITROPAN-XL) 5 MG 24 hr tablet Take 5 mg by mouth daily in the afternoon.    . Red Yeast Rice 600 MG CAPS Take 600 mg by mouth daily in the afternoon.    . triamcinolone cream (KENALOG) 0.1 % Apply 1 application topically daily as needed (dry skin).    . vitamin B-12 (CYANOCOBALAMIN) 1000 MCG tablet Take 1,000 mcg by mouth daily.     No current facility-administered medications for this visit.    SURGICAL HISTORY:  Past Surgical History:  Procedure Laterality Date  . ANKLE SURGERY Left    as child, fracture  . BUNIONECTOMY    . VIDEO BRONCHOSCOPY WITH ENDOBRONCHIAL NAVIGATION N/A  02/02/2020   Procedure: VIDEO BRONCHOSCOPY WITH ENDOBRONCHIAL NAVIGATION;  Surgeon: Melrose Nakayama, MD;  Location: [facility];  Service: Thoracic;  Laterality: N/A;    REVIEW OF SYSTEMS:  A comprehensive review of systems was negative except for: Constitutional: positive for fatigue   PHYSICAL EXAMINATION: General appearance: alert, cooperative, fatigued and no distress Head: Normocephalic, without obvious abnormality, atraumatic Neck: no adenopathy, no JVD, supple, symmetrical, trachea midline and thyroid not enlarged, symmetric, no tenderness/mass/nodules Lymph nodes: Cervical, supraclavicular, and axillary nodes normal. Resp: clear to auscultation bilaterally Back: symmetric, no  curvature. ROM normal. No CVA tenderness. Cardio: regular rate and rhythm, S1, S2 normal, no murmur, click, rub or gallop GI: soft, non-tender; bowel sounds normal; no masses,  no organomegaly Extremities: extremities normal, atraumatic, no cyanosis or edema  ECOG PERFORMANCE STATUS: 2 - Symptomatic, <50% confined to bed  Blood pressure (!) 129/99, pulse 84, temperature 97.7 F (36.5 C), temperature source Temporal, resp. rate 17, height 5\' 4"  (1.626 m), weight 211 lb 14.4 oz (96.1 kg), SpO2 96 %.  LABORATORY DATA: Lab Results  Component Value Date   WBC 7.0 06/05/2020   HGB 14.8 06/05/2020   HCT 45.7 06/05/2020   MCV 92.9 06/05/2020   PLT 238 06/05/2020      Chemistry      Component Value Date/Time   NA 142 06/05/2020 1030   K 4.4 06/05/2020 1030   CL 107 06/05/2020 1030   CO2 24 06/05/2020 1030   BUN 20 06/05/2020 1030   CREATININE 1.01 (H) 06/05/2020 1030      Component Value Date/Time   CALCIUM 9.8 06/05/2020 1030   ALKPHOS 84 06/05/2020 1030   AST 10 (L) 06/05/2020 1030   ALT 10 06/05/2020 1030   BILITOT 0.9 06/05/2020 1030       RADIOGRAPHIC STUDIES: CT Chest W Contrast  Result Date: 06/05/2020 CLINICAL DATA:  Non-small cell lung cancer, restaging. EXAM: CT CHEST WITH CONTRAST TECHNIQUE: Multidetector CT imaging of the chest was performed during intravenous contrast administration. CONTRAST:  26mL OMNIPAQUE IOHEXOL 300 MG/ML  SOLN COMPARISON:  PET-CT 01/25/2020 FINDINGS: Cardiovascular: Mild cardiac enlargement. No pericardial effusion. Aortic atherosclerosis. Left main, lad, left circumflex and RCA coronary artery calcifications noted. Mediastinum/Nodes: Normal appearance of the thyroid gland. The trachea appears patent and is midline. Normal appearance of the esophagus. Lungs/Pleura: No pleural effusion identified. Lesion within the medial right upper lobe measures 3.2 x 2.2 cm, image 28/8. Previously this measured 3.3 x 2.3 cm. Interval development of mild pleural  nodularity and thickening involving the posterior right upper lobe with adjacent scar like density, image 35/8. Favor changes secondary to external beam radiation. Upper Abdomen: Small hiatal hernia noted. No acute findings identified within the imaged portions of the upper abdomen. Musculoskeletal: Mild thoracolumbar scoliosis and multilevel degenerative disc disease. IMPRESSION: 1. Stable to minimal decrease in size of medial right upper lobe lung lesion. 2. Interval development of mild pleural nodularity and thickening involving the posterior right upper lobe with adjacent scar like density. Favor changes secondary to external beam radiation. 3. Left main and 3 vessel coronary artery calcifications noted. 4. Small hiatal hernia. 5. Aortic atherosclerosis. Aortic Atherosclerosis (ICD10-I70.0). Electronically Signed   By: Kerby Moors M.D.   On: 06/05/2020 13:44   ECHOCARDIOGRAM COMPLETE  Result Date: 05/31/2020    ECHOCARDIOGRAM REPORT   Patient Name:   Laura Walker Date of Exam: 05/31/2020 Medical Rec #:  [id number]       Height:  64.2 in Accession #:    [id number]      Weight:       211.6 lb Date of Birth:  1930-09-07       BSA:          2.008 m Patient Age:    86 years        BP:           110/84 mmHg Patient Gender: F               HR:           87 bpm. Exam Location:  [facility] Procedure: 2D Echo, Cardiac Doppler and Color Doppler Indications:    I48.91 Atrial Fibrillation  History:        Patient has no prior history of Echocardiogram examinations.                 Abnormal ECG, Arrythmias:Atrial Fibrillation and LBBB; Risk                 Factors:Sleep Apnea, Family History of Coronary Artery Disease                 and Former Smoker. Chronic Kidney Disease, Non-Small Cell                 Carcinoma of Right Upper Lung.  Sonographer:    Deliah Boston RDCS Referring Phys: [phone] JILL D MCDANIEL IMPRESSIONS  1. Left ventricular ejection fraction, by estimation, is 50 to 55%. The left ventricle  has low normal function. The left ventricle has no regional wall motion abnormalities. Left ventricular diastolic function could not be evaluated.  2. Right ventricular systolic function is normal. The right ventricular size is normal. There is normal pulmonary artery systolic pressure.  3. The mitral valve is normal in structure. Mild mitral valve regurgitation. No evidence of mitral stenosis.  4. Unable to determine valve morphology due to image quality. Aortic valve regurgitation is trivial.  5. The inferior vena cava is normal in size with greater than 50% respiratory variability, suggesting right atrial pressure of 3 mmHg. Comparison(s): No prior Echocardiogram. FINDINGS  Left Ventricle: Left ventricular ejection fraction, by estimation, is 50 to 55%. The left ventricle has low normal function. The left ventricle has no regional wall motion abnormalities. The left ventricular internal cavity size was normal in size. There is no left ventricular hypertrophy. Left ventricular diastolic function could not be evaluated due to atrial fibrillation. Left ventricular diastolic function could not be evaluated. Right Ventricle: The right ventricular size is normal. No increase in right ventricular wall thickness. Right ventricular systolic function is normal. There is normal pulmonary artery systolic pressure. The tricuspid regurgitant velocity is 2.83 m/s, and  with an assumed right atrial pressure of 3 mmHg, the estimated right ventricular systolic pressure is 00.9 mmHg. Left Atrium: Left atrial size was normal in size. Right Atrium: Right atrial size was normal in size. Pericardium: There is no evidence of pericardial effusion. Mitral Valve: The mitral valve is normal in structure. Mild mitral valve regurgitation. No evidence of mitral valve stenosis. Tricuspid Valve: The tricuspid valve is normal in structure. Tricuspid valve regurgitation is mild . No evidence of tricuspid stenosis. Aortic Valve: Unable to determine  valve morphology due to image quality.. There is mild thickening and mild calcification of the aortic valve. Aortic valve regurgitation is trivial. There is mild thickening of the aortic valve. There is mild calcification of the aortic valve. Pulmonic Valve: The pulmonic valve  was not well visualized. Pulmonic valve regurgitation is trivial. No evidence of pulmonic stenosis. Aorta: The aortic root, ascending aorta and aortic arch are all structurally normal, with no evidence of dilitation or obstruction. Venous: The inferior vena cava is normal in size with greater than 50% respiratory variability, suggesting right atrial pressure of 3 mmHg. IAS/Shunts: The atrial septum is grossly normal.  LEFT VENTRICLE PLAX 2D LVIDd:         4.58 cm  Diastology LVIDs:         2.92 cm  LV e' lateral:   5.13 cm/s LV PW:         1.05 cm  LV E/e' lateral: 16.6 LV IVS:        1.08 cm  LV e' medial:    7.75 cm/s LVOT diam:     2.32 cm  LV E/e' medial:  11.0 LV SV:         56 LV SV Index:   28 LVOT Area:     4.23 cm  RIGHT VENTRICLE RV S prime:     17.80 cm/s LEFT ATRIUM           Index LA Vol (A2C): 31.4 ml 15.64 ml/m LA Vol (A4C): 22.5 ml 11.21 ml/m  AORTIC VALVE LVOT Vmax:   72.90 cm/s LVOT Vmean:  48.000 cm/s LVOT VTI:    0.134 m  AORTA Ao Root diam: 3.52 cm MITRAL VALVE               TRICUSPID VALVE MV VTI:      m             TR Peak grad:   32.0 mmHg MR Peak grad: 103.2 mmHg   TR Vmax:        283.00 cm/s MR Vmax:      508.00 cm/s MV E velocity: 85.35 cm/s  SHUNTS                            Systemic VTI:  0.13 m                            Systemic Diam: 2.32 cm Buford Dresser MD Electronically signed by Buford Dresser MD Signature Date/Time: 05/31/2020/7:34:19 PM    Final     ASSESSMENT AND PLAN: This is a very pleasant 86 years old white female recently diagnosed with a stage Ib (T2 a, N0, M0) non-small cell lung cancer, adenocarcinoma presented with right upper lobe lung mass diagnosed in March 2021 status post  SBRT to the right upper lobe lung mass under the care of Dr. Sondra Come. The patient is currently on observation and she is feeling fine today with no concerning complaints. She had repeat CT scan of the chest performed recently.  I personally and independently reviewed the scans and discussed the results with the patient and her daughters. Her scan shows no concerning findings for disease progression and she continues to have radiation-induced fibrosis in the area of treatment. I recommended for the patient to continue on observation with repeat CT scan of the chest in 6 months. She was advised to call immediately if she has any concerning symptoms in the interval. The patient voices understanding of current disease status and treatment options and is in agreement with the current care plan.  All questions were answered. The patient knows to call the clinic with any problems, questions or concerns. We  can certainly see the patient much sooner if necessary. Disclaimer: This note was dictated with voice recognition software. Similar sounding words can inadvertently be transcribed and may not be corrected upon review.

## 2020-06-12 ENCOUNTER — Telehealth: Payer: Self-pay | Admitting: Internal Medicine

## 2020-06-12 NOTE — Telephone Encounter (Signed)
Scheduled per los. Called and left msg. Mailed printout  °

## 2020-07-13 DIAGNOSIS — Z20828 Contact with and (suspected) exposure to other viral communicable diseases: Secondary | ICD-10-CM | POA: Diagnosis not present

## 2020-07-24 DIAGNOSIS — Z20828 Contact with and (suspected) exposure to other viral communicable diseases: Secondary | ICD-10-CM | POA: Diagnosis not present

## 2020-08-01 DIAGNOSIS — Z20828 Contact with and (suspected) exposure to other viral communicable diseases: Secondary | ICD-10-CM | POA: Diagnosis not present

## 2020-09-01 ENCOUNTER — Other Ambulatory Visit: Payer: Self-pay

## 2020-09-01 ENCOUNTER — Encounter: Payer: Self-pay | Admitting: Orthopedic Surgery

## 2020-09-01 ENCOUNTER — Ambulatory Visit (INDEPENDENT_AMBULATORY_CARE_PROVIDER_SITE_OTHER): Payer: PPO | Admitting: Orthopedic Surgery

## 2020-09-01 DIAGNOSIS — M170 Bilateral primary osteoarthritis of knee: Secondary | ICD-10-CM | POA: Diagnosis not present

## 2020-09-01 MED ORDER — LIDOCAINE HCL 1 % IJ SOLN
5.0000 mL | INTRAMUSCULAR | Status: AC | PRN
  Administered 2020-09-01: 5 mL

## 2020-09-01 MED ORDER — METHYLPREDNISOLONE ACETATE 40 MG/ML IJ SUSP
40.0000 mg | INTRAMUSCULAR | Status: AC | PRN
  Administered 2020-09-01: 40 mg via INTRA_ARTICULAR

## 2020-09-01 MED ORDER — BUPIVACAINE HCL 0.25 % IJ SOLN
4.0000 mL | INTRAMUSCULAR | Status: AC | PRN
  Administered 2020-09-01: 4 mL via INTRA_ARTICULAR

## 2020-09-01 NOTE — Progress Notes (Signed)
Office Visit Note   Patient: Laura Walker           Date of Birth: 1930-02-02           MRN: [id number] Visit Date: 09/01/2020 Requested by: [name], MD [place] [address] [place],  [place] [address] PCP: [name], MD  Subjective: Chief Complaint  Patient presents with  . Left Knee - Pain  . Right Knee - Pain    HPI: [name] is an 86 year old patient with bilateral knee arthritis.  She has been walking a little bit less according to her caregiver.  Does report some knee symptoms occasionally.  Does limit her ambulation.  Not much in the way of night pain or rest pain.  She lives with her daughter at home.              ROS: All systems reviewed are negative as they relate to the chief complaint within the history of present illness.  Patient denies  fevers or chills.   Assessment & Plan: Visit Diagnoses:  1. Bilateral primary osteoarthritis of knee     Plan: Impression is bilateral knee arthritis.  Plan is bilateral knee injections today.  I think that will give her several months of relief.  Could consider repeat injections not before 4 months.  Follow-up as needed.  Follow-Up Instructions: Return if symptoms worsen or fail to improve.   Orders:  No orders of the defined types were placed in this encounter.  No orders of the defined types were placed in this encounter.     Procedures: Large Joint Inj: bilateral knee on 09/01/2020 12:50 PM Indications: diagnostic evaluation, joint swelling and pain Details: 18 G 1.5 in needle, superolateral approach  Arthrogram: No  Medications (Right): 5 mL lidocaine 1 %; 4 mL bupivacaine 0.25 %; 40 mg methylPREDNISolone acetate 40 MG/ML Medications (Left): 5 mL lidocaine 1 %; 4 mL bupivacaine 0.25 %; 40 mg methylPREDNISolone acetate 40 MG/ML Outcome: tolerated well, no immediate complications Procedure, treatment alternatives, risks and benefits explained, specific risks discussed. Consent was given by the  patient. Immediately prior to procedure a time out was called to verify the correct patient, procedure, equipment, support staff and site/side marked as required. Patient was prepped and draped in the usual sterile fashion.       Clinical Data: No additional findings.  Objective: Vital Signs: There were no vitals taken for this visit.  Physical Exam:   Constitutional: Patient appears well-developed HEENT:  Head: Normocephalic Eyes:EOM are normal Neck: Normal range of motion Cardiovascular: Normal rate Pulmonary/chest: Effort normal Neurologic: Patient is alert Skin: Skin is warm Psychiatric: Patient has normal mood and affect    Ortho Exam: Ortho exam demonstrates full active and passive range of motion of ankles and hips.  Valgus alignment more on the right than the left.  Trace effusion bilaterally.  Patient comes within 5 degrees of full extension and has flexion past 90 in both knees.  No groin pain with internal extra rotation of the leg.  No other masses lymphadenopathy or skin changes noted in that knee region.  Specialty Comments:  No specialty comments available.  Imaging: No results found.   PMFS History: Patient Active Problem List   Diagnosis Date Noted  . Adenocarcinoma of right lung, stage 1 ([place]) 02/07/2020  . Left bundle branch block 02/02/2020  . Persistent atrial fibrillation ([place]) 02/02/2020  . Lung mass 01/06/2020  . Mild dementia ([place]) 08/24/2018   Past Medical History:  Diagnosis  Date  . Anxiety   . Arthritis   . CKD (chronic kidney disease)   . Dementia ([place])   . Depression   . Dysrhythmia    new onset AFIB.   Marland Kitchen Hypercholesteremia   . Memory loss   . OSA (obstructive sleep apnea)    CPAP  . Osteopenia     Family History  Problem Relation Age of Onset  . Cancer - Lung Mother   . Heart disease Father   . Atrial fibrillation Sister     Past Surgical History:  Procedure Laterality Date  . ANKLE SURGERY Left    as child, fracture    . BUNIONECTOMY    . VIDEO BRONCHOSCOPY WITH ENDOBRONCHIAL NAVIGATION N/A 02/02/2020   Procedure: VIDEO BRONCHOSCOPY WITH ENDOBRONCHIAL NAVIGATION;  Surgeon: Melrose Nakayama, MD;  Location: [facility];  Service: Thoracic;  Laterality: N/A;   Social History   Occupational History    Comment: retired sub teacher/tutor  Tobacco Use  . Smoking status: Former Smoker    Quit date: 12/13/1984    Years since quitting: 35.7  . Smokeless tobacco: Never Used  Vaping Use  . Vaping Use: Never used  Substance and Sexual Activity  . Alcohol use: Yes    Comment: occas wine  . Drug use: No  . Sexual activity: Not on file

## 2020-09-11 ENCOUNTER — Encounter: Payer: Self-pay | Admitting: Internal Medicine

## 2020-09-14 DIAGNOSIS — Z111 Encounter for screening for respiratory tuberculosis: Secondary | ICD-10-CM | POA: Diagnosis not present

## 2020-09-25 DIAGNOSIS — F339 Major depressive disorder, recurrent, unspecified: Secondary | ICD-10-CM | POA: Diagnosis not present

## 2020-09-25 DIAGNOSIS — F0390 Unspecified dementia without behavioral disturbance: Secondary | ICD-10-CM | POA: Diagnosis not present

## 2020-09-25 DIAGNOSIS — E559 Vitamin D deficiency, unspecified: Secondary | ICD-10-CM | POA: Diagnosis not present

## 2020-09-25 DIAGNOSIS — N3281 Overactive bladder: Secondary | ICD-10-CM | POA: Diagnosis not present

## 2020-09-28 DIAGNOSIS — Z7901 Long term (current) use of anticoagulants: Secondary | ICD-10-CM | POA: Diagnosis not present

## 2020-09-28 DIAGNOSIS — Z9181 History of falling: Secondary | ICD-10-CM | POA: Diagnosis not present

## 2020-09-28 DIAGNOSIS — M150 Primary generalized (osteo)arthritis: Secondary | ICD-10-CM | POA: Diagnosis not present

## 2020-09-28 DIAGNOSIS — F0390 Unspecified dementia without behavioral disturbance: Secondary | ICD-10-CM | POA: Diagnosis not present

## 2020-09-28 DIAGNOSIS — F339 Major depressive disorder, recurrent, unspecified: Secondary | ICD-10-CM | POA: Diagnosis not present

## 2020-09-28 DIAGNOSIS — E559 Vitamin D deficiency, unspecified: Secondary | ICD-10-CM | POA: Diagnosis not present

## 2020-10-13 DIAGNOSIS — F331 Major depressive disorder, recurrent, moderate: Secondary | ICD-10-CM | POA: Diagnosis not present

## 2020-10-13 DIAGNOSIS — F0390 Unspecified dementia without behavioral disturbance: Secondary | ICD-10-CM | POA: Diagnosis not present

## 2020-10-17 DIAGNOSIS — F339 Major depressive disorder, recurrent, unspecified: Secondary | ICD-10-CM | POA: Diagnosis not present

## 2020-10-17 DIAGNOSIS — M150 Primary generalized (osteo)arthritis: Secondary | ICD-10-CM | POA: Diagnosis not present

## 2020-10-17 DIAGNOSIS — F0390 Unspecified dementia without behavioral disturbance: Secondary | ICD-10-CM | POA: Diagnosis not present

## 2020-10-25 DIAGNOSIS — Z7901 Long term (current) use of anticoagulants: Secondary | ICD-10-CM | POA: Diagnosis not present

## 2020-10-25 DIAGNOSIS — F0390 Unspecified dementia without behavioral disturbance: Secondary | ICD-10-CM | POA: Diagnosis not present

## 2020-10-25 DIAGNOSIS — E559 Vitamin D deficiency, unspecified: Secondary | ICD-10-CM | POA: Diagnosis not present

## 2020-10-25 DIAGNOSIS — F339 Major depressive disorder, recurrent, unspecified: Secondary | ICD-10-CM | POA: Diagnosis not present

## 2020-10-25 DIAGNOSIS — M150 Primary generalized (osteo)arthritis: Secondary | ICD-10-CM | POA: Diagnosis not present

## 2020-10-25 DIAGNOSIS — Z9181 History of falling: Secondary | ICD-10-CM | POA: Diagnosis not present

## 2020-10-28 IMAGING — CT NM PET TUM IMG INITIAL (PI) SKULL BASE T - THIGH
1 of 7 series · 1 of 25 positions shown · non-contrast
Comparison: Chest CT 01/06/2020

CLINICAL DATA: Initial treatment strategy for right upper lobe lung
mass.

EXAM:
NUCLEAR MEDICINE PET SKULL BASE TO THIGH
TECHNIQUE: 11.3 mCi F-18 FDG was injected intravenously. Full-ring PET imaging
was performed from the skull base to thigh after the radiotracer. CT
data was obtained and used for attenuation correction and anatomic
localization.
Fasting blood glucose: 97 mg/dl

[Series 3: pet sk_thigh ac · axial · 5.0mm · 4.07mm/px · 1 of 222 slices shown]
[im 133/222]
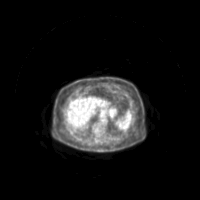

[1 of 25 positions shown; findings below may reference images not displayed]

FINDINGS: Mediastinal blood pool activity: SUV max

Liver activity: SUV max NA

NECK: No significant abnormal hypermetabolic activity in this
region.

Incidental CT findings: Bilateral common carotid atherosclerotic
calcification

CHEST: [DATE] by 2.2 cm right upper lobe mass has maximum SUV of 4.3,
favoring low-grade malignancy.

Incidental CT findings: Coronary, aortic arch, and branch vessel
atherosclerotic vascular disease. Mild cardiomegaly. Small type 1
hiatal hernia.

ABDOMEN/PELVIS: Focally accentuated activity in the distal
transverse colon, with a 1.1 cm focus of activity having a maximum
SUV of 14.0. Other segmental accentuated bowel activity is most
likely physiologic, but this focus of activity is nonspecific and
could be physiologic or due to a polyp.

Incidental CT findings: Descending and sigmoid colon diverticulosis.
Aortoiliac atherosclerotic vascular disease. Peripelvic renal cysts
bilaterally. Left renal atrophy. Questionable punctate 1-2 mm
nonobstructive renal calculi in the lower poles of both kidneys.

SKELETON: No significant abnormal hypermetabolic activity in this
region.

Incidental CT findings: Bilateral degenerative glenohumeral
arthropathy. Moderate-sized umbilical hernia contains adipose
tissue. Lumbar spondylosis and degenerative disc disease.
IMPRESSION: 1. The 3.2 by 2.2 cm right upper lobe mass has low-grade metabolic
activity, maximum SUV 4.3, favoring low-grade malignancy. No
findings of distant metastatic spread.
2. Small focus of accentuated activity in the distal transverse
colon, polyp versus focal physiologic activity. Correlate with the
patient's colon screening history in determining whether colonoscopy
is appropriate.
3. Other imaging findings of potential clinical significance: Aortic
Atherosclerosis ([id number].G). Coronary atherosclerosis. Mild
cardiomegaly. Small type 1 hiatal hernia. Moderate-sized umbilical
hernia containing adipose tissue. Descending and sigmoid colon
diverticulosis. Left renal atrophy. Probable mild punctate
nonobstructive bilateral nephrolithiasis. Degenerative glenohumeral
arthropathy bilaterally. Lumbar spondylosis and degenerative disc
disease.

## 2020-10-30 DIAGNOSIS — F339 Major depressive disorder, recurrent, unspecified: Secondary | ICD-10-CM | POA: Diagnosis not present

## 2020-10-30 DIAGNOSIS — N3281 Overactive bladder: Secondary | ICD-10-CM | POA: Diagnosis not present

## 2020-10-30 DIAGNOSIS — E559 Vitamin D deficiency, unspecified: Secondary | ICD-10-CM | POA: Diagnosis not present

## 2020-10-30 DIAGNOSIS — F0390 Unspecified dementia without behavioral disturbance: Secondary | ICD-10-CM | POA: Diagnosis not present

## 2020-10-30 DIAGNOSIS — H1033 Unspecified acute conjunctivitis, bilateral: Secondary | ICD-10-CM | POA: Diagnosis not present

## 2020-11-08 ENCOUNTER — Ambulatory Visit: Payer: PPO | Admitting: Cardiology

## 2020-11-10 DIAGNOSIS — F0390 Unspecified dementia without behavioral disturbance: Secondary | ICD-10-CM | POA: Diagnosis not present

## 2020-11-10 DIAGNOSIS — F331 Major depressive disorder, recurrent, moderate: Secondary | ICD-10-CM | POA: Diagnosis not present

## 2020-11-23 DIAGNOSIS — Z03818 Encounter for observation for suspected exposure to other biological agents ruled out: Secondary | ICD-10-CM | POA: Diagnosis not present

## 2020-12-04 DIAGNOSIS — N3281 Overactive bladder: Secondary | ICD-10-CM | POA: Diagnosis not present

## 2020-12-04 DIAGNOSIS — R600 Localized edema: Secondary | ICD-10-CM | POA: Diagnosis not present

## 2020-12-04 DIAGNOSIS — F0390 Unspecified dementia without behavioral disturbance: Secondary | ICD-10-CM | POA: Diagnosis not present

## 2020-12-04 DIAGNOSIS — I82492 Acute embolism and thrombosis of other specified deep vein of left lower extremity: Secondary | ICD-10-CM | POA: Diagnosis not present

## 2020-12-07 ENCOUNTER — Encounter: Payer: Self-pay | Admitting: Internal Medicine

## 2020-12-07 DIAGNOSIS — Z03818 Encounter for observation for suspected exposure to other biological agents ruled out: Secondary | ICD-10-CM | POA: Diagnosis not present

## 2020-12-08 DIAGNOSIS — F331 Major depressive disorder, recurrent, moderate: Secondary | ICD-10-CM | POA: Diagnosis not present

## 2020-12-08 DIAGNOSIS — F0390 Unspecified dementia without behavioral disturbance: Secondary | ICD-10-CM | POA: Diagnosis not present

## 2020-12-11 ENCOUNTER — Telehealth: Payer: Self-pay | Admitting: Internal Medicine

## 2020-12-11 ENCOUNTER — Inpatient Hospital Stay: Payer: PPO

## 2020-12-11 ENCOUNTER — Ambulatory Visit (HOSPITAL_COMMUNITY): Payer: PPO

## 2020-12-11 NOTE — Telephone Encounter (Signed)
1/18 appt has been rescheduled to 1/31 at 10am. I lft her daughter Golden Circle a vm w/the new appt date and time.

## 2020-12-11 NOTE — Telephone Encounter (Signed)
I cld and lft a vm to reschedule Laura Walker's 1/18 appt to after 1/26.

## 2020-12-12 ENCOUNTER — Inpatient Hospital Stay: Payer: PPO | Admitting: Internal Medicine

## 2020-12-14 DIAGNOSIS — Z03818 Encounter for observation for suspected exposure to other biological agents ruled out: Secondary | ICD-10-CM | POA: Diagnosis not present

## 2020-12-16 DIAGNOSIS — F331 Major depressive disorder, recurrent, moderate: Secondary | ICD-10-CM | POA: Diagnosis not present

## 2020-12-20 ENCOUNTER — Encounter: Payer: Self-pay | Admitting: Internal Medicine

## 2020-12-20 ENCOUNTER — Inpatient Hospital Stay (HOSPITAL_BASED_OUTPATIENT_CLINIC_OR_DEPARTMENT_OTHER): Payer: PPO | Admitting: Medical

## 2020-12-20 ENCOUNTER — Other Ambulatory Visit: Payer: Self-pay

## 2020-12-20 ENCOUNTER — Inpatient Hospital Stay: Payer: PPO | Attending: Internal Medicine

## 2020-12-20 ENCOUNTER — Ambulatory Visit (HOSPITAL_COMMUNITY)
Admission: RE | Admit: 2020-12-20 | Discharge: 2020-12-20 | Disposition: A | Discharge status: Home / Self Care | Payer: PPO | Source: Ambulatory Visit | Attending: Internal Medicine | Admitting: Internal Medicine

## 2020-12-20 ENCOUNTER — Encounter (HOSPITAL_COMMUNITY): Payer: Self-pay

## 2020-12-20 DIAGNOSIS — C3490 Malignant neoplasm of unspecified part of unspecified bronchus or lung: Secondary | ICD-10-CM | POA: Diagnosis not present

## 2020-12-20 DIAGNOSIS — C3491 Malignant neoplasm of unspecified part of right bronchus or lung: Secondary | ICD-10-CM | POA: Diagnosis not present

## 2020-12-20 DIAGNOSIS — J90 Pleural effusion, not elsewhere classified: Secondary | ICD-10-CM | POA: Diagnosis not present

## 2020-12-20 DIAGNOSIS — C3411 Malignant neoplasm of upper lobe, right bronchus or lung: Secondary | ICD-10-CM | POA: Insufficient documentation

## 2020-12-20 DIAGNOSIS — Z03818 Encounter for observation for suspected exposure to other biological agents ruled out: Secondary | ICD-10-CM | POA: Diagnosis not present

## 2020-12-20 DIAGNOSIS — R918 Other nonspecific abnormal finding of lung field: Secondary | ICD-10-CM | POA: Diagnosis not present

## 2020-12-20 LAB — CMP (CANCER CENTER ONLY)
ALT: 10 U/L (ref 0–44)
AST: 11 U/L — ABNORMAL LOW (ref 15–41)
Albumin: 3.3 g/dL — ABNORMAL LOW (ref 3.5–5.0)
Alkaline Phosphatase: 104 U/L (ref 38–126)
Anion gap: 7 (ref 5–15)
BUN: 14 mg/dL (ref 8–23)
CO2: 29 mmol/L (ref 22–32)
Calcium: 9.8 mg/dL (ref 8.9–10.3)
Chloride: 103 mmol/L (ref 98–111)
Creatinine: 1.08 mg/dL — ABNORMAL HIGH (ref 0.44–1.00)
GFR, Estimated: 49 mL/min — ABNORMAL LOW (ref >60)
Glucose, Bld: 100 mg/dL — ABNORMAL HIGH (ref 70–99)
Potassium: 4.8 mmol/L (ref 3.5–5.1)
Sodium: 139 mmol/L (ref 135–145)
Total Bilirubin: 0.6 mg/dL (ref 0.3–1.2)
Total Protein: 6.9 g/dL (ref 6.5–8.1)

## 2020-12-20 LAB — CBC WITH DIFFERENTIAL (CANCER CENTER ONLY)
Abs Immature Granulocytes: 0.03 10*3/uL (ref 0.00–0.07)
Basophils Absolute: 0.1 10*3/uL (ref 0.0–0.1)
Basophils Relative: 2 %
Eosinophils Absolute: 0.3 10*3/uL (ref 0.0–0.5)
Eosinophils Relative: 3 %
HCT: 43.2 % (ref 36.0–46.0)
Hemoglobin: 13.6 g/dL (ref 12.0–15.0)
Immature Granulocytes: 0 %
Lymphocytes Relative: 15 %
Lymphs Abs: 1.2 10*3/uL (ref 0.7–4.0)
MCH: 29.2 pg (ref 26.0–34.0)
MCHC: 31.5 g/dL (ref 30.0–36.0)
MCV: 92.9 fL (ref 80.0–100.0)
Monocytes Absolute: 0.8 10*3/uL (ref 0.1–1.0)
Monocytes Relative: 10 %
Neutro Abs: 5.5 10*3/uL (ref 1.7–7.7)
Neutrophils Relative %: 70 %
Platelet Count: 287 10*3/uL (ref 150–400)
RBC: 4.65 MIL/uL (ref 3.87–5.11)
RDW: 13.4 % (ref 11.5–15.5)
WBC Count: 8 10*3/uL (ref 4.0–10.5)
nRBC: 0 % (ref 0.0–0.2)

## 2020-12-20 MED ORDER — IOHEXOL 300 MG/ML  SOLN
75.0000 mL | Freq: Once | INTRAMUSCULAR | Status: AC | PRN
  Administered 2020-12-20: 75 mL via INTRAVENOUS

## 2020-12-21 ENCOUNTER — Encounter: Payer: Self-pay | Admitting: Internal Medicine

## 2020-12-25 ENCOUNTER — Inpatient Hospital Stay (HOSPITAL_BASED_OUTPATIENT_CLINIC_OR_DEPARTMENT_OTHER): Payer: PPO | Admitting: Internal Medicine

## 2020-12-25 ENCOUNTER — Other Ambulatory Visit: Payer: Self-pay

## 2020-12-25 ENCOUNTER — Encounter: Payer: Self-pay | Admitting: Internal Medicine

## 2020-12-25 DIAGNOSIS — C3490 Malignant neoplasm of unspecified part of unspecified bronchus or lung: Secondary | ICD-10-CM | POA: Diagnosis not present

## 2020-12-25 DIAGNOSIS — C3491 Malignant neoplasm of unspecified part of right bronchus or lung: Secondary | ICD-10-CM

## 2020-12-25 DIAGNOSIS — C3411 Malignant neoplasm of upper lobe, right bronchus or lung: Secondary | ICD-10-CM | POA: Diagnosis not present

## 2020-12-25 NOTE — Progress Notes (Signed)
This provider was called to the front to evaluate a patient who had "fallen" as she was getting out of the passenger side of her daughter's car and getting into a wheelchair.  Upon further evaluation, the patient's legs had given away on her as she was getting out of her daughter's car.  She simply sat down on the ground.  She had no injuries.  She was on her way to radiation therapy.  She was evaluated and was found to be without acute injuries.  She was alert and oriented.  She was helped to the wheelchair by [facility] cancer center staff.  No further intervention was indicated.  Sandi Mealy, MHS, PA-C Physician Assistant

## 2020-12-25 NOTE — Progress Notes (Signed)
[place] [phone]   [phone]  PROGRESS NOTE FOR TELEMEDICINE VISITS  [name], MD [address] [address] [address]  I connected with@ on 12/25/20 at 10:00 AM EST by video enabled telemedicine visit and verified that I am speaking with the correct person using two identifiers.   I discussed the limitations, risks, security and privacy concerns of performing an evaluation and management service by telemedicine and the availability of in-person appointments. I also discussed with the patient that there may be a patient responsible charge related to this service. The patient expressed understanding and agreed to proceed.  Other persons participating in the visit and their role in the encounter: Daughter  Patient's location: Skilled nursing facility Provider's location: [place]  DIAGNOSIS: Stage Ib (T2 a, N0, M0) non-small cell lung cancer, adenocarcinoma presented with right upper lobe lung mass diagnosed in March 2021.  PRIOR THERAPY: Curative SBRT to the right upper lobe lung mass completed March 10, 2020 under the care of Dr. Sondra Come.  CURRENT THERAPY: Observation. INTERVAL HISTORY: Laura Walker 87 y.o. female has a MyChart video visit with me today for evaluation and discussion of her scan results.  The patient is currently a resident of a skilled nursing facility because of memory issues and significant weakness.  She was accompanied by her daughter during the visit.  The patient is feeling fine today with no concerning complaints.  She denied having any current chest pain, shortness of breath, cough or hemoptysis.  She denied having any fever or chills.  She was unable to walk for the last 2 months because of significant weakness in the lower extremities.  She had repeat CT scan of the chest performed recently and we are having the visit for evaluation and discussion of her scan results and  recommendation regarding her condition.  MEDICAL HISTORY: Past Medical History:  Diagnosis Date  . Anxiety   . Arthritis   . CKD (chronic kidney disease)   . Dementia ([place])   . Depression   . Dysrhythmia    new onset AFIB.   Laura Walker Hypercholesteremia   . Memory loss   . OSA (obstructive sleep apnea)    CPAP  . Osteopenia     ALLERGIES:  is allergic to tramadol.  MEDICATIONS:  Current Outpatient Medications  Medication Sig Dispense Refill  . apixaban (ELIQUIS) 5 MG TABS tablet Take 1 tablet (5 mg total) by mouth 2 (two) times daily. 60 tablet 11  . BOOSTRIX 5-2.5-18.5 LF-MCG/0.5 injection     . buPROPion (WELLBUTRIN) 75 MG tablet Take 75 mg by mouth every morning.    . carboxymethylcellulose (REFRESH TEARS) 0.5 % SOLN Place 1 drop into both eyes 2 (two) times daily as needed (dry eyes).    . Cholecalciferol (VITAMIN D) 50 MCG (2000 UT) tablet Take 2,000 Units by mouth daily.    . ciclopirox (PENLAC) 8 % solution Apply 1 drop topically daily. On toenails  4  . clotrimazole-betamethasone (LOTRISONE) cream Apply 1 application topically daily as needed (fungus).    Laura Walker donepezil (ARICEPT) 10 MG tablet TAKE 1 TABLET BY MOUTH AT BEDTIME 30 tablet 0  . DULoxetine (CYMBALTA) 30 MG capsule Take 30 mg by mouth daily.    Laura Walker erythromycin ophthalmic ointment SMARTSIG:1 Sparingly In Eye(s) 3 Times Daily    . FLUZONE HIGH-DOSE QUADRIVALENT 0.7 ML SUSY     . meloxicam (MOBIC) 7.5 MG tablet Take 15 mg by mouth daily in the  afternoon.   4  . memantine (NAMENDA) 10 MG tablet Take 1 tablet by mouth twice daily 60 tablet 0  . Naphazoline-Pheniramine (OPCON-A) 0.027-0.315 % SOLN Place 1 drop into both eyes daily as needed (allergies).    . Omega-3 Fatty Acids (FISH OIL ULTRA) 1400 MG CAPS Take 1,400 mg by mouth daily.    Laura Walker oxybutynin (DITROPAN-XL) 5 MG 24 hr tablet Take 5 mg by mouth daily in the afternoon.    . Red Yeast Rice 600 MG CAPS Take 600 mg by mouth daily in the afternoon.    . triamcinolone  cream (KENALOG) 0.1 % Apply 1 application topically daily as needed (dry skin).    . vitamin B-12 (CYANOCOBALAMIN) 1000 MCG tablet Take 1,000 mcg by mouth daily.     No current facility-administered medications for this visit.    SURGICAL HISTORY:  Past Surgical History:  Procedure Laterality Date  . ANKLE SURGERY Left    as child, fracture  . BUNIONECTOMY    . VIDEO BRONCHOSCOPY WITH ENDOBRONCHIAL NAVIGATION N/A 02/02/2020   Procedure: VIDEO BRONCHOSCOPY WITH ENDOBRONCHIAL NAVIGATION;  Surgeon: Melrose Nakayama, MD;  Location: [facility];  Service: Thoracic;  Laterality: N/A;    REVIEW OF SYSTEMS:  A comprehensive review of systems was negative except for: Constitutional: positive for fatigue Musculoskeletal: positive for muscle weakness   LABORATORY DATA: Lab Results  Component Value Date   WBC 8.0 12/20/2020   HGB 13.6 12/20/2020   HCT 43.2 12/20/2020   MCV 92.9 12/20/2020   PLT 287 12/20/2020      Chemistry      Component Value Date/Time   NA 139 12/20/2020 1132   K 4.8 12/20/2020 1132   CL 103 12/20/2020 1132   CO2 29 12/20/2020 1132   BUN 14 12/20/2020 1132   CREATININE 1.08 (H) 12/20/2020 1132      Component Value Date/Time   CALCIUM 9.8 12/20/2020 1132   ALKPHOS 104 12/20/2020 1132   AST 11 (L) 12/20/2020 1132   ALT 10 12/20/2020 1132   BILITOT 0.6 12/20/2020 1132       RADIOGRAPHIC STUDIES: CT Chest W Contrast  Result Date: 12/20/2020 CLINICAL DATA:  Stage 1 B RIGHT lung adenocarcinoma. (T2 a N0 M0. Curative SP RT. EXAM: CT CHEST WITH CONTRAST TECHNIQUE: Multidetector CT imaging of the chest was performed during intravenous contrast administration. CONTRAST:  78mL OMNIPAQUE IOHEXOL 300 MG/ML  SOLN COMPARISON:  CT 12/15/2019 FINDINGS: Cardiovascular: No significant vascular findings. Normal heart size. No pericardial effusion. Mediastinum/Nodes: Small RIGHT paratracheal lymph nodes are not pathologic by size criteria and not changed from prior. No  supraclavicular adenopathy. Lungs/Pleura: Peribronchial thickening in the medial RIGHT upper lobe measures 29 mm by 25 mm in axial dimension compared to 31 x 21 mm. In coronal imaging, the lesion occupies a broader contact with the medial upper lobe pleural surface measuring 41 mm segment (image 70/5) compared to 23 mm. Small bilateral pleural effusions are new from prior. Upper Abdomen: Limited view of the liver, kidneys, pancreas are unremarkable. Normal adrenal glands. Musculoskeletal: No acute osseous abnormality. IMPRESSION: 1. Apparent increase in volume of RIGHT upper lobe peribronchial mass at prior treatment site for stage I B lung carcinoma. The apparent increase in size is noted in the coronal projection. Recommend either short-term follow-up with contrast CT versus FDG PET scan. 2. No evidence of metastatic mediastinal adenopathy. 3. New bilateral small pleural effusions. Electronically Signed   By: Suzy Bouchard M.D.   On: 12/20/2020 15:10  ASSESSMENT AND PLAN: This is a very pleasant 87 years old white female recently diagnosed with a stage Ib (T2 a, N0, M0) non-small cell lung cancer, adenocarcinoma presented with right upper lobe lung mass diagnosed in March 2021 status post SBRT to the right upper lobe lung mass under the care of Dr. Sondra Come. The patient is currently on observation and she is feeling fine except for the generalized weakness and fatigue and inability to walk for the last 2 months.  She is currently a resident of a skilled nursing facility. She had repeat CT scan of the chest performed recently.  I personally and independently reviewed the scan and discussed the results with the patient and her daughter. Her scan showed no concerning findings for progression except for slight increase in the size of the right upper lobe peribronchial mass. I recommended for the patient to continue on observation with repeat CT scan of the chest in 6 months. She was also advised to call  immediately if she has any concerning complaints or changes in the upcoming months before her scan. I discussed the assessment and treatment plan with the patient. The patient was provided an opportunity to ask questions and all were answered. The patient agreed with the plan and demonstrated an understanding of the instructions.   The patient was advised to call back or seek an in-person evaluation if the symptoms worsen or if the condition fails to improve as anticipated.  I provided 20 minutes of face-to-face video visit time during this encounter, and > 50% was spent counseling as documented under my assessment & plan.  Eilleen Kempf, MD 12/25/2020 10:26 AM  Disclaimer: This note was dictated with voice recognition software. Similar sounding words can inadvertently be transcribed and may not be corrected upon review.

## 2021-01-01 DIAGNOSIS — G4733 Obstructive sleep apnea (adult) (pediatric): Secondary | ICD-10-CM | POA: Diagnosis not present

## 2021-01-04 DIAGNOSIS — F0390 Unspecified dementia without behavioral disturbance: Secondary | ICD-10-CM | POA: Diagnosis not present

## 2021-01-04 DIAGNOSIS — F331 Major depressive disorder, recurrent, moderate: Secondary | ICD-10-CM | POA: Diagnosis not present

## 2021-01-08 DIAGNOSIS — N3281 Overactive bladder: Secondary | ICD-10-CM | POA: Diagnosis not present

## 2021-01-08 DIAGNOSIS — F0390 Unspecified dementia without behavioral disturbance: Secondary | ICD-10-CM | POA: Diagnosis not present

## 2021-01-08 DIAGNOSIS — R600 Localized edema: Secondary | ICD-10-CM | POA: Diagnosis not present

## 2021-01-08 DIAGNOSIS — S81812A Laceration without foreign body, left lower leg, initial encounter: Secondary | ICD-10-CM | POA: Diagnosis not present

## 2021-01-12 DIAGNOSIS — N319 Neuromuscular dysfunction of bladder, unspecified: Secondary | ICD-10-CM | POA: Diagnosis not present

## 2021-01-12 DIAGNOSIS — F0390 Unspecified dementia without behavioral disturbance: Secondary | ICD-10-CM | POA: Diagnosis not present

## 2021-01-12 DIAGNOSIS — S81802D Unspecified open wound, left lower leg, subsequent encounter: Secondary | ICD-10-CM | POA: Diagnosis not present

## 2021-01-12 DIAGNOSIS — N183 Chronic kidney disease, stage 3 unspecified: Secondary | ICD-10-CM | POA: Diagnosis not present

## 2021-01-12 DIAGNOSIS — F32A Depression, unspecified: Secondary | ICD-10-CM | POA: Diagnosis not present

## 2021-01-12 DIAGNOSIS — I4891 Unspecified atrial fibrillation: Secondary | ICD-10-CM | POA: Diagnosis not present

## 2021-01-20 ENCOUNTER — Inpatient Hospital Stay (HOSPITAL_COMMUNITY): Payer: PPO

## 2021-01-20 ENCOUNTER — Emergency Department (HOSPITAL_COMMUNITY): Payer: PPO

## 2021-01-20 ENCOUNTER — Other Ambulatory Visit: Payer: Self-pay

## 2021-01-20 ENCOUNTER — Encounter (HOSPITAL_COMMUNITY): Payer: Self-pay | Admitting: Emergency Medicine

## 2021-01-20 ENCOUNTER — Inpatient Hospital Stay (HOSPITAL_COMMUNITY)
Admission: EM | Admit: 2021-01-20 | Discharge: 2021-01-30 | DRG: 871 | Disposition: A | Discharge status: Hospice | Payer: PPO | Source: Skilled Nursing Facility | Attending: Internal Medicine | Admitting: Internal Medicine

## 2021-01-20 DIAGNOSIS — Z7189 Other specified counseling: Secondary | ICD-10-CM | POA: Diagnosis not present

## 2021-01-20 DIAGNOSIS — Z87891 Personal history of nicotine dependence: Secondary | ICD-10-CM

## 2021-01-20 DIAGNOSIS — Z79899 Other long term (current) drug therapy: Secondary | ICD-10-CM

## 2021-01-20 DIAGNOSIS — A419 Sepsis, unspecified organism: Principal | ICD-10-CM | POA: Diagnosis present

## 2021-01-20 DIAGNOSIS — I4819 Other persistent atrial fibrillation: Secondary | ICD-10-CM | POA: Diagnosis not present

## 2021-01-20 DIAGNOSIS — Z8249 Family history of ischemic heart disease and other diseases of the circulatory system: Secondary | ICD-10-CM | POA: Diagnosis not present

## 2021-01-20 DIAGNOSIS — I4821 Permanent atrial fibrillation: Secondary | ICD-10-CM | POA: Diagnosis not present

## 2021-01-20 DIAGNOSIS — K8020 Calculus of gallbladder without cholecystitis without obstruction: Secondary | ICD-10-CM | POA: Diagnosis not present

## 2021-01-20 DIAGNOSIS — Z6838 Body mass index (BMI) 38.0-38.9, adult: Secondary | ICD-10-CM | POA: Diagnosis not present

## 2021-01-20 DIAGNOSIS — N179 Acute kidney failure, unspecified: Secondary | ICD-10-CM | POA: Diagnosis not present

## 2021-01-20 DIAGNOSIS — J9601 Acute respiratory failure with hypoxia: Secondary | ICD-10-CM | POA: Diagnosis present

## 2021-01-20 DIAGNOSIS — E7800 Pure hypercholesterolemia, unspecified: Secondary | ICD-10-CM | POA: Diagnosis not present

## 2021-01-20 DIAGNOSIS — Z66 Do not resuscitate: Secondary | ICD-10-CM | POA: Diagnosis not present

## 2021-01-20 DIAGNOSIS — Z20822 Contact with and (suspected) exposure to covid-19: Secondary | ICD-10-CM | POA: Diagnosis not present

## 2021-01-20 DIAGNOSIS — Z7401 Bed confinement status: Secondary | ICD-10-CM

## 2021-01-20 DIAGNOSIS — I491 Atrial premature depolarization: Secondary | ICD-10-CM | POA: Diagnosis not present

## 2021-01-20 DIAGNOSIS — R410 Disorientation, unspecified: Secondary | ICD-10-CM | POA: Diagnosis not present

## 2021-01-20 DIAGNOSIS — E860 Dehydration: Secondary | ICD-10-CM | POA: Diagnosis present

## 2021-01-20 DIAGNOSIS — I428 Other cardiomyopathies: Secondary | ICD-10-CM | POA: Diagnosis not present

## 2021-01-20 DIAGNOSIS — I447 Left bundle-branch block, unspecified: Secondary | ICD-10-CM | POA: Diagnosis not present

## 2021-01-20 DIAGNOSIS — I519 Heart disease, unspecified: Secondary | ICD-10-CM | POA: Diagnosis not present

## 2021-01-20 DIAGNOSIS — R7401 Elevation of levels of liver transaminase levels: Secondary | ICD-10-CM | POA: Diagnosis not present

## 2021-01-20 DIAGNOSIS — I130 Hypertensive heart and chronic kidney disease with heart failure and stage 1 through stage 4 chronic kidney disease, or unspecified chronic kidney disease: Secondary | ICD-10-CM | POA: Diagnosis not present

## 2021-01-20 DIAGNOSIS — I4891 Unspecified atrial fibrillation: Secondary | ICD-10-CM | POA: Diagnosis not present

## 2021-01-20 DIAGNOSIS — G9341 Metabolic encephalopathy: Secondary | ICD-10-CM | POA: Diagnosis present

## 2021-01-20 DIAGNOSIS — R5381 Other malaise: Secondary | ICD-10-CM | POA: Diagnosis not present

## 2021-01-20 DIAGNOSIS — Z515 Encounter for palliative care: Secondary | ICD-10-CM | POA: Diagnosis not present

## 2021-01-20 DIAGNOSIS — J90 Pleural effusion, not elsewhere classified: Secondary | ICD-10-CM | POA: Diagnosis not present

## 2021-01-20 DIAGNOSIS — F0390 Unspecified dementia without behavioral disturbance: Secondary | ICD-10-CM | POA: Diagnosis present

## 2021-01-20 DIAGNOSIS — I509 Heart failure, unspecified: Secondary | ICD-10-CM

## 2021-01-20 DIAGNOSIS — R069 Unspecified abnormalities of breathing: Secondary | ICD-10-CM | POA: Diagnosis not present

## 2021-01-20 DIAGNOSIS — I5020 Unspecified systolic (congestive) heart failure: Secondary | ICD-10-CM | POA: Diagnosis not present

## 2021-01-20 DIAGNOSIS — N182 Chronic kidney disease, stage 2 (mild): Secondary | ICD-10-CM | POA: Diagnosis not present

## 2021-01-20 DIAGNOSIS — R4182 Altered mental status, unspecified: Secondary | ICD-10-CM | POA: Diagnosis not present

## 2021-01-20 DIAGNOSIS — I5021 Acute systolic (congestive) heart failure: Secondary | ICD-10-CM | POA: Diagnosis present

## 2021-01-20 DIAGNOSIS — C3491 Malignant neoplasm of unspecified part of right bronchus or lung: Secondary | ICD-10-CM

## 2021-01-20 DIAGNOSIS — J440 Chronic obstructive pulmonary disease with acute lower respiratory infection: Secondary | ICD-10-CM | POA: Diagnosis not present

## 2021-01-20 DIAGNOSIS — I5031 Acute diastolic (congestive) heart failure: Secondary | ICD-10-CM | POA: Diagnosis not present

## 2021-01-20 DIAGNOSIS — J189 Pneumonia, unspecified organism: Secondary | ICD-10-CM | POA: Diagnosis not present

## 2021-01-20 DIAGNOSIS — G4733 Obstructive sleep apnea (adult) (pediatric): Secondary | ICD-10-CM | POA: Diagnosis present

## 2021-01-20 DIAGNOSIS — I2510 Atherosclerotic heart disease of native coronary artery without angina pectoris: Secondary | ICD-10-CM | POA: Diagnosis not present

## 2021-01-20 DIAGNOSIS — I499 Cardiac arrhythmia, unspecified: Secondary | ICD-10-CM | POA: Diagnosis not present

## 2021-01-20 DIAGNOSIS — R509 Fever, unspecified: Secondary | ICD-10-CM

## 2021-01-20 DIAGNOSIS — Z789 Other specified health status: Secondary | ICD-10-CM | POA: Diagnosis not present

## 2021-01-20 DIAGNOSIS — R6520 Severe sepsis without septic shock: Secondary | ICD-10-CM | POA: Diagnosis present

## 2021-01-20 DIAGNOSIS — R7989 Other specified abnormal findings of blood chemistry: Secondary | ICD-10-CM

## 2021-01-20 DIAGNOSIS — R0602 Shortness of breath: Secondary | ICD-10-CM | POA: Diagnosis not present

## 2021-01-20 DIAGNOSIS — C3411 Malignant neoplasm of upper lobe, right bronchus or lung: Secondary | ICD-10-CM | POA: Diagnosis not present

## 2021-01-20 DIAGNOSIS — Z923 Personal history of irradiation: Secondary | ICD-10-CM

## 2021-01-20 DIAGNOSIS — I517 Cardiomegaly: Secondary | ICD-10-CM | POA: Diagnosis not present

## 2021-01-20 DIAGNOSIS — I358 Other nonrheumatic aortic valve disorders: Secondary | ICD-10-CM | POA: Diagnosis not present

## 2021-01-20 DIAGNOSIS — Z7901 Long term (current) use of anticoagulants: Secondary | ICD-10-CM

## 2021-01-20 DIAGNOSIS — M2550 Pain in unspecified joint: Secondary | ICD-10-CM | POA: Diagnosis not present

## 2021-01-20 HISTORY — DX: Malignant (primary) neoplasm, unspecified: C80.1

## 2021-01-20 LAB — CBC
HCT: 48.8 % — ABNORMAL HIGH (ref 36.0–46.0)
Hemoglobin: 14.9 g/dL (ref 12.0–15.0)
MCH: 28.5 pg (ref 26.0–34.0)
MCHC: 30.5 g/dL (ref 30.0–36.0)
MCV: 93.5 fL (ref 80.0–100.0)
Platelets: 284 10*3/uL (ref 150–400)
RBC: 5.22 MIL/uL — ABNORMAL HIGH (ref 3.87–5.11)
RDW: 14.6 % (ref 11.5–15.5)
WBC: 9.5 10*3/uL (ref 4.0–10.5)
nRBC: 0 % (ref 0.0–0.2)

## 2021-01-20 LAB — CBG MONITORING, ED: Glucose-Capillary: 162 mg/dL — ABNORMAL HIGH (ref 70–99)

## 2021-01-20 LAB — I-STAT ARTERIAL BLOOD GAS, ED
Acid-base deficit: 4 mmol/L — ABNORMAL HIGH (ref 0.0–2.0)
Bicarbonate: 21 mmol/L (ref 20.0–28.0)
Calcium, Ion: 1.31 mmol/L (ref 1.15–1.40)
HCT: 38 % (ref 36.0–46.0)
Hemoglobin: 12.9 g/dL (ref 12.0–15.0)
O2 Saturation: 94 %
Potassium: 4.4 mmol/L (ref 3.5–5.1)
Sodium: 139 mmol/L (ref 135–145)
TCO2: 22 mmol/L (ref 22–32)
pCO2 arterial: 35.4 mmHg (ref 32.0–48.0)
pH, Arterial: 7.381 (ref 7.350–7.450)
pO2, Arterial: 71 mmHg — ABNORMAL LOW (ref 83.0–108.0)

## 2021-01-20 LAB — URINALYSIS, ROUTINE W REFLEX MICROSCOPIC
Glucose, UA: NEGATIVE mg/dL
Hgb urine dipstick: NEGATIVE
Ketones, ur: NEGATIVE mg/dL
Leukocytes,Ua: NEGATIVE
Nitrite: NEGATIVE
Protein, ur: >=300 mg/dL — AB
Specific Gravity, Urine: >1.046 — ABNORMAL HIGH (ref 1.005–1.030)
pH: 5 (ref 5.0–8.0)

## 2021-01-20 LAB — COMPREHENSIVE METABOLIC PANEL
ALT: 116 U/L — ABNORMAL HIGH (ref 0–44)
AST: 117 U/L — ABNORMAL HIGH (ref 15–41)
Albumin: 3.4 g/dL — ABNORMAL LOW (ref 3.5–5.0)
Alkaline Phosphatase: 92 U/L (ref 38–126)
Anion gap: 12 (ref 5–15)
BUN: 22 mg/dL (ref 8–23)
CO2: 22 mmol/L (ref 22–32)
Calcium: 10.1 mg/dL (ref 8.9–10.3)
Chloride: 108 mmol/L (ref 98–111)
Creatinine, Ser: 1.16 mg/dL — ABNORMAL HIGH (ref 0.44–1.00)
GFR, Estimated: 45 mL/min — ABNORMAL LOW (ref >60)
Glucose, Bld: 167 mg/dL — ABNORMAL HIGH (ref 70–99)
Potassium: 4.6 mmol/L (ref 3.5–5.1)
Sodium: 142 mmol/L (ref 135–145)
Total Bilirubin: 1.8 mg/dL — ABNORMAL HIGH (ref 0.3–1.2)
Total Protein: 6.6 g/dL (ref 6.5–8.1)

## 2021-01-20 LAB — TROPONIN I (HIGH SENSITIVITY): Troponin I (High Sensitivity): 52 ng/L — ABNORMAL HIGH (ref <18)

## 2021-01-20 LAB — BRAIN NATRIURETIC PEPTIDE: B Natriuretic Peptide: 1608.7 pg/mL — ABNORMAL HIGH (ref 0.0–100.0)

## 2021-01-20 LAB — RESP PANEL BY RT-PCR (FLU A&B, COVID) ARPGX2
Influenza A by PCR: NEGATIVE
Influenza B by PCR: NEGATIVE
SARS Coronavirus 2 by RT PCR: NEGATIVE

## 2021-01-20 LAB — LACTIC ACID, PLASMA
Lactic Acid, Venous: 4.1 mmol/L (ref 0.5–1.9)
Lactic Acid, Venous: 3.7 mmol/L (ref 0.5–1.9)
Lactic Acid, Venous: 3.9 mmol/L (ref 0.5–1.9)

## 2021-01-20 LAB — STREP PNEUMONIAE URINARY ANTIGEN: Strep Pneumo Urinary Antigen: NEGATIVE

## 2021-01-20 LAB — PROCALCITONIN: Procalcitonin: <0.1 ng/mL

## 2021-01-20 MED ORDER — SODIUM CHLORIDE 0.9 % IV SOLN
2.0000 g | INTRAVENOUS | Status: DC
  Administered 2021-01-20 – 2021-01-25 (×6): 2 g via INTRAVENOUS
  Filled 2021-01-20 (×4): qty 20
  Filled 2021-01-20: qty 2
  Filled 2021-01-20: qty 20

## 2021-01-20 MED ORDER — LACTATED RINGERS IV SOLN: INTRAVENOUS | Status: DC

## 2021-01-20 MED ORDER — SODIUM CHLORIDE 0.9 % IV SOLN
100.0000 mg | Freq: Two times a day (BID) | INTRAVENOUS | Status: DC
  Administered 2021-01-20 – 2021-01-24 (×8): 100 mg via INTRAVENOUS
  Filled 2021-01-20 (×9): qty 100

## 2021-01-20 MED ORDER — METOPROLOL TARTRATE 5 MG/5ML IV SOLN: 2.5000 mg | Freq: Four times a day (QID) | INTRAVENOUS | Status: DC | PRN

## 2021-01-20 MED ORDER — ONDANSETRON HCL 4 MG/2ML IJ SOLN: 4.0000 mg | Freq: Four times a day (QID) | INTRAMUSCULAR | Status: DC | PRN

## 2021-01-20 MED ORDER — BUPROPION HCL 75 MG PO TABS
75.0000 mg | ORAL_TABLET | Freq: Every morning | ORAL | Status: DC
  Administered 2021-01-22 – 2021-01-30 (×9): 75 mg via ORAL
  Filled 2021-01-20 (×10): qty 1

## 2021-01-20 MED ORDER — IPRATROPIUM BROMIDE 0.02 % IN SOLN
0.5000 mg | Freq: Four times a day (QID) | RESPIRATORY_TRACT | Status: DC
  Administered 2021-01-20 – 2021-01-22 (×10): 0.5 mg via RESPIRATORY_TRACT
  Filled 2021-01-20 (×9): qty 2.5

## 2021-01-20 MED ORDER — CICLOPIROX 8 % EX SOLN: 1.0000 [drp] | Freq: Every day | CUTANEOUS | Status: DC

## 2021-01-20 MED ORDER — ACETAMINOPHEN 325 MG PO TABS: 650.0000 mg | ORAL_TABLET | ORAL | Status: DC | PRN

## 2021-01-20 MED ORDER — DIGOXIN 0.25 MG/ML IJ SOLN
0.1250 mg | Freq: Four times a day (QID) | INTRAMUSCULAR | Status: AC
  Administered 2021-01-20 – 2021-01-21 (×4): 0.125 mg via INTRAVENOUS
  Filled 2021-01-20 (×4): qty 0.5

## 2021-01-20 MED ORDER — DONEPEZIL HCL 10 MG PO TABS
10.0000 mg | ORAL_TABLET | Freq: Every day | ORAL | Status: DC
  Administered 2021-01-21 – 2021-01-29 (×9): 10 mg via ORAL
  Filled 2021-01-20 (×9): qty 1

## 2021-01-20 MED ORDER — DILTIAZEM LOAD VIA INFUSION
10.0000 mg | Freq: Once | INTRAVENOUS | Status: AC
  Administered 2021-01-20: 10 mg via INTRAVENOUS
  Filled 2021-01-20: qty 10

## 2021-01-20 MED ORDER — LACTATED RINGERS IV BOLUS
30.0000 mL/kg | Freq: Once | INTRAVENOUS | Status: AC
  Administered 2021-01-20: 3060 mL via INTRAVENOUS

## 2021-01-20 MED ORDER — IPRATROPIUM BROMIDE 0.02 % IN SOLN: 0.5000 mg | Freq: Four times a day (QID) | RESPIRATORY_TRACT | Status: DC | PRN

## 2021-01-20 MED ORDER — LABETALOL HCL 5 MG/ML IV SOLN: 10.0000 mg | INTRAVENOUS | Status: DC | PRN

## 2021-01-20 MED ORDER — VANCOMYCIN HCL 1500 MG/300ML IV SOLN: 1500.0000 mg | INTRAVENOUS | Status: DC

## 2021-01-20 MED ORDER — FUROSEMIDE 10 MG/ML IJ SOLN
40.0000 mg | Freq: Once | INTRAMUSCULAR | Status: AC
  Administered 2021-01-20: 40 mg via INTRAVENOUS
  Filled 2021-01-20: qty 4

## 2021-01-20 MED ORDER — SODIUM CHLORIDE 0.9 % IV SOLN
2.0000 g | Freq: Once | INTRAVENOUS | Status: AC
  Administered 2021-01-20: 2 g via INTRAVENOUS
  Filled 2021-01-20: qty 2

## 2021-01-20 MED ORDER — OXYBUTYNIN CHLORIDE ER 5 MG PO TB24
5.0000 mg | ORAL_TABLET | Freq: Every day | ORAL | Status: DC
  Administered 2021-01-21 – 2021-01-28 (×8): 5 mg via ORAL
  Filled 2021-01-20 (×11): qty 1

## 2021-01-20 MED ORDER — HEPARIN (PORCINE) 25000 UT/250ML-% IV SOLN
1100.0000 [IU]/h | INTRAVENOUS | Status: DC
  Administered 2021-01-20: 1000 [IU]/h via INTRAVENOUS
  Filled 2021-01-20: qty 250

## 2021-01-20 MED ORDER — SODIUM CHLORIDE 0.9% FLUSH
3.0000 mL | INTRAVENOUS | Status: DC | PRN
  Administered 2021-01-26: 3 mL via INTRAVENOUS

## 2021-01-20 MED ORDER — SODIUM CHLORIDE 0.9% FLUSH
3.0000 mL | Freq: Two times a day (BID) | INTRAVENOUS | Status: DC
  Administered 2021-01-21 – 2021-01-30 (×16): 3 mL via INTRAVENOUS

## 2021-01-20 MED ORDER — DILTIAZEM HCL-DEXTROSE 125-5 MG/125ML-% IV SOLN (PREMIX)
5.0000 mg/h | INTRAVENOUS | Status: DC
  Administered 2021-01-20: 5 mg/h via INTRAVENOUS
  Filled 2021-01-20: qty 125

## 2021-01-20 MED ORDER — VANCOMYCIN HCL 2000 MG/400ML IV SOLN
2000.0000 mg | Freq: Once | INTRAVENOUS | Status: AC
  Administered 2021-01-20: 2000 mg via INTRAVENOUS
  Filled 2021-01-20: qty 400

## 2021-01-20 MED ORDER — SODIUM CHLORIDE 0.9 % IV SOLN: 2.0000 g | Freq: Two times a day (BID) | INTRAVENOUS | Status: DC

## 2021-01-20 MED ORDER — SODIUM CHLORIDE 0.9 % IV SOLN
1.0000 g | INTRAVENOUS | Status: DC
  Filled 2021-01-20: qty 10

## 2021-01-20 MED ORDER — DULOXETINE HCL 30 MG PO CPEP
30.0000 mg | ORAL_CAPSULE | Freq: Every day | ORAL | Status: DC
  Administered 2021-01-22 – 2021-01-30 (×9): 30 mg via ORAL
  Filled 2021-01-20 (×9): qty 1

## 2021-01-20 MED ORDER — SODIUM CHLORIDE 0.9 % IV SOLN: 250.0000 mL | INTRAVENOUS | Status: DC | PRN

## 2021-01-20 MED ORDER — APIXABAN 5 MG PO TABS: 5.0000 mg | ORAL_TABLET | Freq: Two times a day (BID) | ORAL | Status: DC

## 2021-01-20 MED ORDER — MEMANTINE HCL 10 MG PO TABS
10.0000 mg | ORAL_TABLET | Freq: Two times a day (BID) | ORAL | Status: DC
  Administered 2021-01-21 – 2021-01-30 (×18): 10 mg via ORAL
  Filled 2021-01-20 (×21): qty 1

## 2021-01-20 NOTE — Progress Notes (Signed)
Pharmacy Antibiotic Note  Laura Walker is a 87 y.o. female admitted on 01/20/2021 with sepsis.  Pharmacy has been consulted for Cefepime and Vancomycin dosing.   Height: 5\' 4"  (162.6 cm) Weight: 102 kg (224 lb 13.9 oz) IBW/kg (Calculated) : 54.7  Temp (24hrs), Avg:100.7 F (38.2 C), Min:100.7 F (38.2 C), Max:100.7 F (38.2 C)  Recent Labs  Lab 01/20/21 1122 01/20/21 1318  WBC 9.5  --   CREATININE  --  1.16*  LATICACIDVEN 4.1* 3.7*    Estimated Creatinine Clearance: 39 mL/min (A) (by C-G formula based on SCr of 1.16 mg/dL (H)).    Allergies  Allergen Reactions  . Tramadol Nausea Only    Antimicrobials this admission: 2/26 Cefepime >>  2/26 Vancomycin >>   Dose adjustments this admission: N/a  Microbiology results: Pending   Plan:  - Cefepime 2g IV q12h - Vancomycin 2000mg  IV x 1 dose  - Followed by Vancomycin 1500mg  IV q48h - Est Calc AUC 517 - Monitor patients renal function and urine output  - De-escalate ABX when appropriate   Thank you for allowing pharmacy to be a part of this patient's care.  Duanne Limerick PharmD. BCPS 01/20/2021 2:58 PM

## 2021-01-20 NOTE — Sepsis Progress Note (Signed)
[facility]ink is following this code sepsis

## 2021-01-20 NOTE — Progress Notes (Signed)
ANTICOAGULATION CONSULT NOTE - Initial Consult  Pharmacy Consult for heparin  Indication: atrial fibrillation  Allergies  Allergen Reactions  . Tramadol Nausea Only    Patient Measurements: Height: 5\' 4"  (162.6 cm) Weight: 102 kg (224 lb 13.9 oz) IBW/kg (Calculated) : 54.7 Heparin Dosing Weight: 80kg  Vital Signs: Temp: 99.9 F (37.7 C) (02/26 1808) Temp Source: Axillary (02/26 1800) BP: 102/68 (02/26 1808) Pulse Rate: 91 (02/26 1933)  Labs: Recent Labs    01/20/21 1122 01/20/21 1318 01/20/21 1633  HGB 14.9  --  12.9  HCT 48.8*  --  38.0  PLT 284  --   --   CREATININE  --  1.16*  --   TROPONINIHS  --  52*  --     Estimated Creatinine Clearance: 39 mL/min (A) (by C-G formula based on SCr of 1.16 mg/dL (H)).   Medical History: Past Medical History:  Diagnosis Date  . Anxiety   . Arthritis   . CKD (chronic kidney disease)   . Dementia ([place])   . Depression   . Dysrhythmia    new onset AFIB.   Marland Kitchen Hypercholesteremia   . Memory loss   . OSA (obstructive sleep apnea)    CPAP  . Osteopenia       Assessment: 89yof admitted with HF/pnemonia and Afib RVR.  She is on apixaban 5mg  BID pta last dose this am.  Plan to start heparin drip tonight and follow up in am if stable consider resume apixaban.   Will use aptt to dose heparin for now as apixaban falsely elevated heparin level.   Goal of Therapy:  Heparin level 0.3-0.7 units/ml aPTT 66-100sec  seconds Monitor platelets by anticoagulation protocol: Yes   Plan:  Stop apixaban  Heparin drip 1000 uts/hr  Draw HL/APTT/ CBC with daily labs   [facility] Pharm.D. CPP, BCPS Clinical Pharmacist [phone] 01/20/2021 8:13 PM

## 2021-01-20 NOTE — ED Triage Notes (Signed)
Patient BIB [facility] from [facility] [place]. Complaint of altered mental status that began last night. Per staff patient needed help with feeding last night which is abnormal. EMS also reported afib rvr with rates between 40-160.

## 2021-01-20 NOTE — H&P (Signed)
History and Physical    SHANTAVIA JHA JOA:[id number] DOB: November 25, 1930 DOA: 01/20/2021  PCP: [name], MD (Confirm with patient/family/NH records and if not entered, this has to be entered at [facility] point of entry) Patient coming from: Nursing home  I have personally briefly reviewed patient's old medical records in [place]  Chief Complaint: AMS  HPI: CATRICIA SCHEERER is a 87 y.o. female with medical history significant of advanced dementia, right upper lobe adenocarcinoma status post radiation 2021, chronic A. fib on Eliquis, CKD stage II, hypertension, OSA on CPAP, presented with altered mentation.  Daughter at bedside reported patient request staff feed her dinner last night which is abnormal, at baseline she can feed herself, and developed a new cough overnight, and remained sleepy through the night.  No documented fever.  EMS this morning found patient in rapid A. fib. ED Course: Patient was found to be hypoxic 88% on room air, low fever 100.7, rapid A. fib, hypertension, chest x-ray suspect bilateral pneumonia, patient has elevated.  Patient received a total of 3 L of IV bolus, Cardizem drip started.  Broad-spectrum antibiotics vancomycin and cefepime started.  Review of Systems: Unable to perform, patient confused   Past Medical History:  Diagnosis Date  . Anxiety   . Arthritis   . CKD (chronic kidney disease)   . Dementia ([place])   . Depression   . Dysrhythmia    new onset AFIB.   Marland Kitchen Hypercholesteremia   . Memory loss   . OSA (obstructive sleep apnea)    CPAP  . Osteopenia     Past Surgical History:  Procedure Laterality Date  . ANKLE SURGERY Left    as child, fracture  . BUNIONECTOMY    . VIDEO BRONCHOSCOPY WITH ENDOBRONCHIAL NAVIGATION N/A 02/02/2020   Procedure: VIDEO BRONCHOSCOPY WITH ENDOBRONCHIAL NAVIGATION;  Surgeon: Melrose Nakayama, MD;  Location: [facility];  Service: Thoracic;  Laterality: N/A;     reports that she quit smoking about 36 years  ago. She has never used smokeless tobacco. She reports current alcohol use. She reports that she does not use drugs.  Allergies  Allergen Reactions  . Tramadol Nausea Only    Family History  Problem Relation Age of Onset  . Cancer - Lung Mother   . Heart disease Father   . Atrial fibrillation Sister      Prior to Admission medications   Medication Sig Start Date End Date Taking? Authorizing Provider  apixaban (ELIQUIS) 5 MG TABS tablet Take 1 tablet (5 mg total) by mouth 2 (two) times daily. 02/18/20  Yes Jerline Pain, MD  buPROPion (WELLBUTRIN) 75 MG tablet Take 75 mg by mouth every morning. 12/07/20  Yes [provider]  Cholecalciferol (VITAMIN D) 50 MCG (2000 UT) tablet Take 2,000 Units by mouth daily.   Yes [provider]  ciclopirox (PENLAC) 8 % solution Apply 1 drop topically daily. On toenails 08/01/18  Yes [provider]  donepezil (ARICEPT) 10 MG tablet TAKE 1 TABLET BY MOUTH AT BEDTIME Patient taking differently: Take 10 mg by mouth at bedtime. 09/07/19  Yes Penumalli, Earlean Polka, MD  DULoxetine (CYMBALTA) 30 MG capsule Take 30 mg by mouth daily.   Yes [provider]  meloxicam (MOBIC) 7.5 MG tablet Take 15 mg by mouth as needed (knee pain). 08/01/18  Yes [provider]  memantine (NAMENDA) 10 MG tablet Take 1 tablet by mouth twice daily 09/07/19  Yes Penumalli, Earlean Polka, MD  nystatin (MYCOSTATIN/NYSTOP) powder  Apply 1 application topically 2 (two) times daily.   Yes [provider]  Omega-3 Fatty Acids (FISH OIL ULTRA) 1400 MG CAPS Take 1,400 mg by mouth daily.   Yes [provider]  oxybutynin (DITROPAN-XL) 5 MG 24 hr tablet Take 5 mg by mouth daily in the afternoon.   Yes [provider]  Red Yeast Rice 600 MG CAPS Take 600 mg by mouth daily in the afternoon.   Yes [provider]  vitamin B-12 (CYANOCOBALAMIN) 1000 MCG tablet Take 1,000 mcg by mouth daily.   Yes [provider]     Physical Exam: Vitals:   01/20/21 1200 01/20/21 1300 01/20/21 1352 01/20/21 1430  BP: (!) 141/112  (!) 151/100 (!) 157/116  Pulse: (!) 44  (!) 127 (!) 47  Resp: (!) 28  (!) 30 (!) 33  Temp:      TempSrc:      SpO2: 94%  93%   Weight:  102 kg    Height:  5\' 4"  (1.626 m)      Constitutional: NAD, calm, comfortable Vitals:   01/20/21 1200 01/20/21 1300 01/20/21 1352 01/20/21 1430  BP: (!) 141/112  (!) 151/100 (!) 157/116  Pulse: (!) 44  (!) 127 (!) 47  Resp: (!) 28  (!) 30 (!) 33  Temp:      TempSrc:      SpO2: 94%  93%   Weight:  102 kg    Height:  5\' 4"  (1.626 m)     Eyes: PERRL, lids and conjunctivae normal ENMT: Mucous membranes are dry. Posterior pharynx clear of any exudate or lesions.Normal dentition.  Neck: normal, supple, no masses, no thyromegaly Respiratory: clear to auscultation bilaterally, no wheezing, no crackles. Normal respiratory effort. No accessory muscle use.  Cardiovascular: Irregular heart rate, no murmurs / rubs / gallops.  2+ extremity edema. 2+ pedal pulses. No carotid bruits.  Abdomen: no tenderness, no masses palpated. No hepatosplenomegaly. Bowel sounds positive.  Musculoskeletal: no clubbing / cyanosis. No joint deformity upper and lower extremities. Good ROM, no contractures. Normal muscle tone.  Skin: no rashes, lesions, ulcers. No induration Neurologic: No facial droops, moving all limbs Psychiatric: Opens eyes, mumbling, confused.    Labs on Admission: I have personally reviewed following labs and imaging studies  CBC: Recent Labs  Lab 01/20/21 1122  WBC 9.5  HGB 14.9  HCT 48.8*  MCV 93.5  PLT 024   Basic Metabolic Panel: Recent Labs  Lab 01/20/21 1318  NA 142  K 4.6  CL 108  CO2 22  GLUCOSE 167*  BUN 22  CREATININE 1.16*  CALCIUM 10.1   GFR: Estimated Creatinine Clearance: 39 mL/min (A) (by C-G formula based on SCr of 1.16 mg/dL (H)). Liver Function Tests: Recent Labs  Lab 01/20/21 1318  AST 117*  ALT 116*   ALKPHOS 92  BILITOT 1.8*  PROT 6.6  ALBUMIN 3.4*   No results for input(s): LIPASE, AMYLASE in the last 168 hours. No results for input(s): AMMONIA in the last 168 hours. Coagulation Profile: No results for input(s): INR, PROTIME in the last 168 hours. Cardiac Enzymes: No results for input(s): CKTOTAL, CKMB, CKMBINDEX, TROPONINI in the last 168 hours. BNP (last 3 results) No results for input(s): PROBNP in the last 8760 hours. HbA1C: No results for input(s): HGBA1C in the last 72 hours. CBG: Recent Labs  Lab 01/20/21 1109  GLUCAP 162*   Lipid Profile: No results for input(s): CHOL, HDL, LDLCALC, TRIG, CHOLHDL, LDLDIRECT in the last 72  hours. Thyroid Function Tests: No results for input(s): TSH, T4TOTAL, FREET4, T3FREE, THYROIDAB in the last 72 hours. Anemia Panel: No results for input(s): VITAMINB12, FOLATE, FERRITIN, TIBC, IRON, RETICCTPCT in the last 72 hours. Urine analysis:    Component Value Date/Time   COLORURINE YELLOW 01/06/2020 1235   APPEARANCEUR HAZY (A) 01/06/2020 1235   LABSPEC 1.017 01/06/2020 1235   PHURINE 8.0 01/06/2020 1235   GLUCOSEU NEGATIVE 01/06/2020 1235   HGBUR NEGATIVE 01/06/2020 1235   BILIRUBINUR NEGATIVE 01/06/2020 1235   KETONESUR 5 (A) 01/06/2020 1235   PROTEINUR NEGATIVE 01/06/2020 1235   NITRITE NEGATIVE 01/06/2020 1235   LEUKOCYTESUR NEGATIVE 01/06/2020 1235    Radiological Exams on Admission: CT HEAD WO CONTRAST  Result Date: 01/20/2021 CLINICAL DATA:  Mental status change. EXAM: CT HEAD WITHOUT CONTRAST TECHNIQUE: Contiguous axial images were obtained from the base of the skull through the vertex without intravenous contrast. COMPARISON:  January 06, 2020 FINDINGS: Brain: No evidence of acute infarction, hemorrhage, hydrocephalus, extra-axial collection or mass lesion/mass effect. Extensive brain parenchymal volume loss and deep white matter microangiopathy. Punctate hypoattenuation in the left thalamus again noted. Vascular:  Calcific atherosclerotic disease of the intra cavernous carotid arteries. Skull: Normal. Negative for fracture or focal lesion. Sinuses/Orbits: No acute finding. Other: None. IMPRESSION: 1. No acute intracranial abnormality. 2. Atrophy, chronic microvascular disease. Electronically Signed   By: Fidela Salisbury M.D.   On: 01/20/2021 13:24   DG Chest Port 1 View  Result Date: 01/20/2021 CLINICAL DATA:  Altered mental status beginning last night EXAM: PORTABLE CHEST 1 VIEW COMPARISON:  Chest CT December 20, 2020 and chest radiograph January 31, 2020. FINDINGS: The heart size and mediastinal contours are partially obscured. Right suprahilar nodular consolidation not significantly changed from chest CT December 20, 2020 given differences in technique. Slightly increased small-moderate left pleural effusion with a similar small right pleural effusion. Left basilar consolidation. Chronic parenchymal lung changes. The visualized skeletal structures are unchanged. IMPRESSION: 1. Similar small right and slightly increased small-moderate left pleural effusion with a right basilar consolidation, which may represent atelectasis or infection. 2. Right suprahilar nodular consolidation similar to chest CT December 20, 2020 given differences in technique. Electronically Signed   By: Dahlia Bailiff MD   On: 01/20/2021 12:27    EKG: Independently reviewed.  A. fib and chronic LBBB  Assessment/Plan Active Problems:   Sepsis ([place])   A-fib ([place])  (please populate well all problems here in Problem List. (For example, if patient is on BP meds at home and you resume or decide to hold them, it is a problem that needs to be her. Same for CAD, COPD, HLD and so on)   Sepsis -Evidenced by tachycardia, elevated lactate, hypoxia, and mentation changes at signs of endorgan damage and source considered to be pneumonia. -Continue broad-spectrum antibiotic coverage, vancomycin cefepime, and doxycycline for atypical coverage given  elevated of LFTs. -Check Legionella, mycoplasma and strep antigen -Check ABG, check CT chest without contrast -Patient is DNR, confirmed with patient's daughter at bedside.  Condition is sick, admit to stepdown unit. -Also suspect aspiration, n.p.o. for now and speech evaluation.  A. fib with RVR -Last year echo showed LVEF 50 to 55% -Continue Cardizem drip for now -CT head negative, will continue Eliquis.  Acute metabolic encephalopathy -Suspect this related to sepsis, treat sepsis and then reevaluate.  Acute hypoxic respite failure -Suspect aspiration pneumonia as shown on the x-ray, CT ordered to better characterize pneumonia. -Clinically also suspect acute diastolic CHF decompensation as patient  has significant elevation of blood pressure as well as fast heart rate/rapid A. Fib.  Patient morbid obese, difficult to judge volume status.  BNP ordered.  We will hold IV fluids for now.  CKD stage II -Volume status hard to judge given patient's morbid obesity although she does have a chronic 2+ edema as confirmed with patient daughter. -Given the significant elevation of lactic acid, patient did receive a total of 3 L IV boluses in ED, will hold off further IV fluid for now.  DVT prophylaxis: Eliquis Code Status: DNR Family Communication: Daughter at bedside Disposition Plan: Patient sick, expect more than 2 midnight hospital stay to treat sepsis and possible underlying CHF and rapid A. fib. Consults called: None Admission status: PCU   Lequita Halt MD [facility] Pager [phone]  01/20/2021, 4:03 PM

## 2021-01-20 NOTE — ED Notes (Signed)
Call pt's daughter [name]) for updates, number listed in the chart.

## 2021-01-20 NOTE — ED Provider Notes (Signed)
[place] EMERGENCY DEPARTMENT Provider Note   CSN: [id number] Arrival date & time: 01/20/21  1041     History Chief Complaint  Patient presents with  . Altered Mental Status    Laura Walker is a 87 y.o. female.  Patient w hx dementia, non-small cell lung ca, from [facility] via EMS with report altered mental status and general weakness in the past 1-2 days. Symptoms acute onset, moderate-severe, constant, persistent.  Pt not verbally responsive to questions, and hx dementia - level 5 caveat.  No report of trauma or fall. No report of fevers.  At baseline, pt reported can walk short distances w walker/assistance, and can feed self, but in past 1-2 days unable to perform those functions.   The history is provided by the patient, the EMS personnel, medical records and the nursing home. The history is limited by the condition of the patient.  Altered Mental Status      Past Medical History:  Diagnosis Date  . Anxiety   . Arthritis   . CKD (chronic kidney disease)   . Dementia ([place])   . Depression   . Dysrhythmia    new onset AFIB.   Marland Kitchen Hypercholesteremia   . Memory loss   . OSA (obstructive sleep apnea)    CPAP  . Osteopenia     Patient Active Problem List   Diagnosis Date Noted  . Adenocarcinoma of right lung, stage 1 ([place]) 02/07/2020  . Left bundle branch block 02/02/2020  . Persistent atrial fibrillation ([place]) 02/02/2020  . Lung mass 01/06/2020  . Mild dementia ([place]) 08/24/2018    Past Surgical History:  Procedure Laterality Date  . ANKLE SURGERY Left    as child, fracture  . BUNIONECTOMY    . VIDEO BRONCHOSCOPY WITH ENDOBRONCHIAL NAVIGATION N/A 02/02/2020   Procedure: VIDEO BRONCHOSCOPY WITH ENDOBRONCHIAL NAVIGATION;  Surgeon: Melrose Nakayama, MD;  Location: [facility];  Service: Thoracic;  Laterality: N/A;     OB History   No obstetric history on file.     Family History  Problem Relation Age of Onset  . Cancer - Lung Mother   . Heart  disease Father   . Atrial fibrillation Sister     Social History   Tobacco Use  . Smoking status: Former Smoker    Quit date: 12/13/1984    Years since quitting: 36.1  . Smokeless tobacco: Never Used  Vaping Use  . Vaping Use: Never used  Substance Use Topics  . Alcohol use: Yes    Comment: occas wine  . Drug use: No    Home Medications Prior to Admission medications   Medication Sig Start Date End Date Taking? Authorizing Provider  apixaban (ELIQUIS) 5 MG TABS tablet Take 1 tablet (5 mg total) by mouth 2 (two) times daily. 02/18/20   Jerline Pain, MD  BOOSTRIX 5-2.5-18.5 LF-MCG/0.5 injection  09/12/20   [provider]  buPROPion (WELLBUTRIN) 75 MG tablet Take 75 mg by mouth every morning. 12/07/20   [provider]  carboxymethylcellulose (REFRESH TEARS) 0.5 % SOLN Place 1 drop into both eyes 2 (two) times daily as needed (dry eyes).    [provider]  Cholecalciferol (VITAMIN D) 50 MCG (2000 UT) tablet Take 2,000 Units by mouth daily.    [provider]  ciclopirox (PENLAC) 8 % solution Apply 1 drop topically daily. On toenails 08/01/18   [provider]  clotrimazole-betamethasone (LOTRISONE) cream Apply 1 application topically daily as needed (fungus).  [provider]  donepezil (ARICEPT) 10 MG tablet TAKE 1 TABLET BY MOUTH AT BEDTIME 09/07/19   Penumalli, Vikram R, MD  DULoxetine (CYMBALTA) 30 MG capsule Take 30 mg by mouth daily.    [provider]  erythromycin ophthalmic ointment SMARTSIG:1 Sparingly In Eye(s) 3 Times Daily 10/30/20   [provider]  FLUZONE HIGH-DOSE QUADRIVALENT 0.7 ML SUSY  09/12/20   [provider]  meloxicam (MOBIC) 7.5 MG tablet Take 15 mg by mouth daily in the afternoon.  08/01/18   [provider]  memantine (NAMENDA) 10 MG tablet Take 1 tablet by mouth twice daily 09/07/19   Penumalli, Earlean Polka, MD  Naphazoline-Pheniramine (OPCON-A) 0.027-0.315 % SOLN Place 1  drop into both eyes daily as needed (allergies).    [provider]  Omega-3 Fatty Acids (FISH OIL ULTRA) 1400 MG CAPS Take 1,400 mg by mouth daily.    [provider]  oxybutynin (DITROPAN-XL) 5 MG 24 hr tablet Take 5 mg by mouth daily in the afternoon.    [provider]  Red Yeast Rice 600 MG CAPS Take 600 mg by mouth daily in the afternoon.    [provider]  triamcinolone cream (KENALOG) 0.1 % Apply 1 application topically daily as needed (dry skin).    [provider]  vitamin B-12 (CYANOCOBALAMIN) 1000 MCG tablet Take 1,000 mcg by mouth daily.    [provider]    Allergies    Tramadol  Review of Systems   Review of Systems  Unable to perform ROS: Mental status change  level 5 caveat, dementia, altered mental status.   Physical Exam Updated Vital Signs There were no vitals taken for this visit.  Physical Exam Vitals and nursing note reviewed.  Constitutional:      Appearance: Normal appearance. She is well-developed.  HENT:     Head: Atraumatic.     Nose: Nose normal.     Mouth/Throat:     Mouth: Mucous membranes are moist.  Eyes:     General: No scleral icterus.    Conjunctiva/sclera: Conjunctivae normal.     Pupils: Pupils are equal, round, and reactive to light.  Neck:     Vascular: No carotid bruit.     Trachea: No tracheal deviation.     Comments: No stiffness or rigidity Cardiovascular:     Rate and Rhythm: Normal rate and regular rhythm.     Pulses: Normal pulses.     Heart sounds: Normal heart sounds. No murmur heard. No friction rub. No gallop.   Pulmonary:     Effort: Pulmonary effort is normal. No respiratory distress.     Breath sounds: Normal breath sounds.     Comments: Decreased bs in bases. Rhonchi on right.  Abdominal:     General: Bowel sounds are normal. There is no distension.     Palpations: Abdomen is soft.     Tenderness: There is no abdominal tenderness. There is no guarding.   Genitourinary:    Comments: No cva tenderness.  Musculoskeletal:        General: No tenderness, deformity or signs of injury.     Cervical back: Normal range of motion and neck supple. No rigidity. No muscular tenderness.     Comments: CTLS spine, non tender, aligned, no step off. Good rom bil extremities without pain or focal bony tenderness. Compression stockings on legs removed. A few superficial lesions to LLE, but no gross/obvious cellulitis.   Skin:    General: Skin is  warm and dry.     Findings: No rash.  Neurological:     Mental Status: She is alert.     Comments: Awake. Opens eyes to voice. Moves bil ext purposefully.   Psychiatric:     Comments: Decreased responsiveness.      ED Results / Procedures / Treatments   Labs (all labs ordered are listed, but only abnormal results are displayed) Results for orders placed or performed during the hospital encounter of 01/20/21  Resp Panel by RT-PCR (Flu A&B, Covid) Nasopharyngeal Swab   Specimen: Nasopharyngeal Swab; Nasopharyngeal(NP) swabs in vial transport medium  Result Value Ref Range   SARS Coronavirus 2 by RT PCR NEGATIVE NEGATIVE   Influenza A by PCR NEGATIVE NEGATIVE   Influenza B by PCR NEGATIVE NEGATIVE  CBC  Result Value Ref Range   WBC 9.5 4.0 - 10.5 K/uL   RBC 5.22 (H) 3.87 - 5.11 MIL/uL   Hemoglobin 14.9 12.0 - 15.0 g/dL   HCT 48.8 (H) 36.0 - 46.0 %   MCV 93.5 80.0 - 100.0 fL   MCH 28.5 26.0 - 34.0 pg   MCHC 30.5 30.0 - 36.0 g/dL   RDW 14.6 11.5 - 15.5 %   Platelets 284 150 - 400 K/uL   nRBC 0.0 0.0 - 0.2 %  Lactic acid, plasma  Result Value Ref Range   Lactic Acid, Venous 4.1 (HH) 0.5 - 1.9 mmol/L  Lactic acid, plasma  Result Value Ref Range   Lactic Acid, Venous 3.7 (HH) 0.5 - 1.9 mmol/L  Comprehensive metabolic panel  Result Value Ref Range   Sodium 142 135 - 145 mmol/L   Potassium 4.6 3.5 - 5.1 mmol/L   Chloride 108 98 - 111 mmol/L   CO2 22 22 - 32 mmol/L   Glucose, Bld 167 (H) 70 - 99 mg/dL    BUN 22 8 - 23 mg/dL   Creatinine, Ser 1.16 (H) 0.44 - 1.00 mg/dL   Calcium 10.1 8.9 - 10.3 mg/dL   Total Protein 6.6 6.5 - 8.1 g/dL   Albumin 3.4 (L) 3.5 - 5.0 g/dL   AST 117 (H) 15 - 41 U/L   ALT 116 (H) 0 - 44 U/L   Alkaline Phosphatase 92 38 - 126 U/L   Total Bilirubin 1.8 (H) 0.3 - 1.2 mg/dL   GFR, Estimated 45 (L) >60 mL/min   Anion gap 12 5 - 15  CBG monitoring, ED  Result Value Ref Range   Glucose-Capillary 162 (H) 70 - 99 mg/dL   Comment 1 Notify RN    Comment 2 Document in Chart   Troponin I (High Sensitivity)  Result Value Ref Range   Troponin I (High Sensitivity) 52 (H) <18 ng/L   CT HEAD WO CONTRAST  Result Date: 01/20/2021 CLINICAL DATA:  Mental status change. EXAM: CT HEAD WITHOUT CONTRAST TECHNIQUE: Contiguous axial images were obtained from the base of the skull through the vertex without intravenous contrast. COMPARISON:  January 06, 2020 FINDINGS: Brain: No evidence of acute infarction, hemorrhage, hydrocephalus, extra-axial collection or mass lesion/mass effect. Extensive brain parenchymal volume loss and deep white matter microangiopathy. Punctate hypoattenuation in the left thalamus again noted. Vascular: Calcific atherosclerotic disease of the intra cavernous carotid arteries. Skull: Normal. Negative for fracture or focal lesion. Sinuses/Orbits: No acute finding. Other: None. IMPRESSION: 1. No acute intracranial abnormality. 2. Atrophy, chronic microvascular disease. Electronically Signed   By: Fidela Salisbury M.D.   On: 01/20/2021 13:24   DG [facility] 1 View  Result  Date: 01/20/2021 CLINICAL DATA:  Altered mental status beginning last night EXAM: PORTABLE CHEST 1 VIEW COMPARISON:  Chest CT December 20, 2020 and chest radiograph January 31, 2020. FINDINGS: The heart size and mediastinal contours are partially obscured. Right suprahilar nodular consolidation not significantly changed from chest CT December 20, 2020 given differences in technique. Slightly increased  small-moderate left pleural effusion with a similar small right pleural effusion. Left basilar consolidation. Chronic parenchymal lung changes. The visualized skeletal structures are unchanged. IMPRESSION: 1. Similar small right and slightly increased small-moderate left pleural effusion with a right basilar consolidation, which may represent atelectasis or infection. 2. Right suprahilar nodular consolidation similar to chest CT December 20, 2020 given differences in technique. Electronically Signed   By: Dahlia Bailiff MD   On: 01/20/2021 12:27    EKG EKG Interpretation  Date/Time:  Saturday January 20 2021 11:14:20 EST Ventricular Rate:  136 PR Interval:    QRS Duration: 120 QT Interval:  303 QTC Calculation: 456 R Axis:   -38 Text Interpretation: Atrial fibrillation with rapid V-rate Left bundle branch block Non-specific ST-t changes Confirmed by Lajean Saver [phone]) on 01/20/2021 11:40:56 AM   Radiology CT HEAD WO CONTRAST  Result Date: 01/20/2021 CLINICAL DATA:  Mental status change. EXAM: CT HEAD WITHOUT CONTRAST TECHNIQUE: Contiguous axial images were obtained from the base of the skull through the vertex without intravenous contrast. COMPARISON:  January 06, 2020 FINDINGS: Brain: No evidence of acute infarction, hemorrhage, hydrocephalus, extra-axial collection or mass lesion/mass effect. Extensive brain parenchymal volume loss and deep white matter microangiopathy. Punctate hypoattenuation in the left thalamus again noted. Vascular: Calcific atherosclerotic disease of the intra cavernous carotid arteries. Skull: Normal. Negative for fracture or focal lesion. Sinuses/Orbits: No acute finding. Other: None. IMPRESSION: 1. No acute intracranial abnormality. 2. Atrophy, chronic microvascular disease. Electronically Signed   By: Fidela Salisbury M.D.   On: 01/20/2021 13:24   DG Chest [place] 1 View  Result Date: 01/20/2021 CLINICAL DATA:  Altered mental status beginning last night EXAM:  PORTABLE CHEST 1 VIEW COMPARISON:  Chest CT December 20, 2020 and chest radiograph January 31, 2020. FINDINGS: The heart size and mediastinal contours are partially obscured. Right suprahilar nodular consolidation not significantly changed from chest CT December 20, 2020 given differences in technique. Slightly increased small-moderate left pleural effusion with a similar small right pleural effusion. Left basilar consolidation. Chronic parenchymal lung changes. The visualized skeletal structures are unchanged. IMPRESSION: 1. Similar small right and slightly increased small-moderate left pleural effusion with a right basilar consolidation, which may represent atelectasis or infection. 2. Right suprahilar nodular consolidation similar to chest CT December 20, 2020 given differences in technique. Electronically Signed   By: Dahlia Bailiff MD   On: 01/20/2021 12:27    Procedures Procedures   Medications Ordered in ED Medications - No data to display  ED Course  I have reviewed the triage vital signs and the nursing notes.  Pertinent labs & imaging results that were available during my care of the patient were reviewed by me and considered in my medical decision making (see chart for details).    MDM Rules/Calculators/A&P                          Iv ns. Continuous pulse ox and cardiac monitoring. Ecg.  Stat labs and imaging.  Reviewed nursing notes and prior charts for additional history.   Labs reviewed/interpreted by me - lactate high. Culture sent. 30 cc/kg lr  given. Post cultures iv abx given.   CXR reviewed/interpreted by me - ?mass. Right basilar infiltrate ?pna. Pt is coughing in ED, daughter notes cough is new. suspected possible pna.   CT reviewed/interpreted by me - no hem.   Repeat lactate improved, but still high - will order 3rd.   Hospitalists consulted for admission re fever, suspected pna, afib/rvr, gen weakness.   Additional labs reviewed/interpreted by me - lfts mildly  elevated. On appreciable tenderness on abd exam, although pt is altered. Will get u/s.   UA remains pending.   Recheck, afib/rvr. cardizem bolus/gtt.   CRITICAL CARE RE; severe sepsis, pna, lung ca, afib/rvr Performed by: Mirna Mires Total critical care time: 140 minutes Critical care time was exclusive of separately billable procedures and treating other patients. Critical care was necessary to treat or prevent imminent or life-threatening deterioration. Critical care was time spent personally by me on the following activities: development of treatment plan with patient and/or surrogate as well as nursing, discussions with consultants, evaluation of patient's response to treatment, examination of patient, obtaining history from patient or surrogate, ordering and performing treatments and interventions, ordering and review of laboratory studies, ordering and review of radiographic studies, pulse oximetry and re-evaluation of patient's condition.   Final Clinical Impression(s) / ED Diagnoses Final diagnoses:  None    Rx / DC Orders ED Discharge Orders    None       Lajean Saver, MD 01/20/21 1452

## 2021-01-20 NOTE — ED Notes (Signed)
Brinly Maietta (daughter) [phone]

## 2021-01-20 NOTE — Progress Notes (Signed)
CT chest, UA and procalcitonin reviewed, infectious source considered to be aspiration PNA, UTI less likely. De-escalate ABX to Ceftriaxone and Doxy.  CT and exam both implying CHF, but likely a diastolic 2/2 afib. Will D/C CCB/Cardizem and load digxoxin.

## 2021-01-20 NOTE — Progress Notes (Signed)
   01/20/21 1715  Assess: MEWS Score  Temp 99.9 F (37.7 C)  BP (!) 134/105  Pulse Rate (!) 102  ECG Heart Rate (!) 108  Resp (!) 32  Level of Consciousness Responds to Voice  SpO2 96 %  O2 Device Bi-PAP  Patient Activity (if Appropriate) In bed  FiO2 (%) 30 %  Assess: MEWS Score  MEWS Temp 0  MEWS Systolic 0  MEWS Pulse 1  MEWS RR 2  MEWS LOC 1  MEWS Score 4  MEWS Score Color Red  Assess: if the MEWS score is Yellow or Red  Were vital signs taken at a resting state? Yes  Focused Assessment Change from prior assessment (see assessment flowsheet)  Early Detection of Sepsis Score *See Row Information* High  MEWS guidelines implemented *See Row Information* Yes  Treat  MEWS Interventions Administered scheduled meds/treatments;Consulted Respiratory Therapy;Other (Comment) (MD ordered stat chest CT)  Pain Scale 0-10  Pain Score 0  Take Vital Signs  Increase Vital Sign Frequency  Red: Q 1hr X 4 then Q 4hr X 4, if remains red, continue Q 4hrs  Escalate  MEWS: Escalate Red: discuss with charge nurse/RN and provider, consider discussing with RRT  Notify: Charge Nurse/RN  Name of Charge Nurse/RN Notified Lisette Abu RN  Date Charge Nurse/RN Notified 01/20/21  Time Charge Nurse/RN Notified 1700  Document  Patient Outcome Stabilized after interventions    After starting Dilt drip with 10 mg bolus HR down from 160's to 100's. IV lasix given immediately upon arrival. Respiratory at bedside put patient on BIPAP. Taking patient to STAT chest CT. Will cm.

## 2021-01-21 ENCOUNTER — Inpatient Hospital Stay (HOSPITAL_COMMUNITY): Payer: PPO

## 2021-01-21 ENCOUNTER — Encounter (HOSPITAL_COMMUNITY): Payer: Self-pay | Admitting: Internal Medicine

## 2021-01-21 DIAGNOSIS — G9341 Metabolic encephalopathy: Secondary | ICD-10-CM

## 2021-01-21 DIAGNOSIS — I5031 Acute diastolic (congestive) heart failure: Secondary | ICD-10-CM | POA: Diagnosis not present

## 2021-01-21 DIAGNOSIS — J9601 Acute respiratory failure with hypoxia: Secondary | ICD-10-CM

## 2021-01-21 LAB — COMPREHENSIVE METABOLIC PANEL
ALT: 340 U/L — ABNORMAL HIGH (ref 0–44)
AST: 381 U/L — ABNORMAL HIGH (ref 15–41)
Albumin: 2.9 g/dL — ABNORMAL LOW (ref 3.5–5.0)
Alkaline Phosphatase: 96 U/L (ref 38–126)
Anion gap: 12 (ref 5–15)
BUN: 24 mg/dL — ABNORMAL HIGH (ref 8–23)
CO2: 28 mmol/L (ref 22–32)
Calcium: 9.4 mg/dL (ref 8.9–10.3)
Chloride: 104 mmol/L (ref 98–111)
Creatinine, Ser: 1.2 mg/dL — ABNORMAL HIGH (ref 0.44–1.00)
GFR, Estimated: 44 mL/min — ABNORMAL LOW (ref >60)
Glucose, Bld: 100 mg/dL — ABNORMAL HIGH (ref 70–99)
Potassium: 3.5 mmol/L (ref 3.5–5.1)
Sodium: 144 mmol/L (ref 135–145)
Total Bilirubin: 1.1 mg/dL (ref 0.3–1.2)
Total Protein: 5.6 g/dL — ABNORMAL LOW (ref 6.5–8.1)

## 2021-01-21 LAB — CBC
HCT: 40.2 % (ref 36.0–46.0)
Hemoglobin: 13.2 g/dL (ref 12.0–15.0)
MCH: 29.9 pg (ref 26.0–34.0)
MCHC: 32.8 g/dL (ref 30.0–36.0)
MCV: 91 fL (ref 80.0–100.0)
Platelets: 219 10*3/uL (ref 150–400)
RBC: 4.42 MIL/uL (ref 3.87–5.11)
RDW: 14.5 % (ref 11.5–15.5)
WBC: 8.9 10*3/uL (ref 4.0–10.5)
nRBC: 0 % (ref 0.0–0.2)

## 2021-01-21 LAB — APTT
aPTT: 53 seconds — ABNORMAL HIGH (ref 24–36)
aPTT: 69 seconds — ABNORMAL HIGH (ref 24–36)

## 2021-01-21 LAB — LACTIC ACID, PLASMA: Lactic Acid, Venous: 1.4 mmol/L (ref 0.5–1.9)

## 2021-01-21 LAB — ECHOCARDIOGRAM COMPLETE
AR max vel: 2.24 cm2
AV Area VTI: 2.03 cm2
AV Area mean vel: 2.41 cm2
AV Mean grad: 2.2 mmHg
AV Peak grad: 4.5 mmHg
Ao pk vel: 1.06 m/s
Height: 64 in
MV M vel: 4.2 m/s
MV Peak grad: 70.6 mmHg
Weight: 3555.58 oz

## 2021-01-21 LAB — PROCALCITONIN: Procalcitonin: 0.1 ng/mL

## 2021-01-21 LAB — BRAIN NATRIURETIC PEPTIDE: B Natriuretic Peptide: 1367.3 pg/mL — ABNORMAL HIGH (ref 0.0–100.0)

## 2021-01-21 LAB — HEPARIN LEVEL (UNFRACTIONATED): Heparin Unfractionated: 2.1 IU/mL — ABNORMAL HIGH (ref 0.30–0.70)

## 2021-01-21 MED ORDER — APIXABAN 2.5 MG PO TABS
2.5000 mg | ORAL_TABLET | Freq: Two times a day (BID) | ORAL | Status: DC
  Administered 2021-01-21 – 2021-01-24 (×7): 2.5 mg via ORAL
  Filled 2021-01-21 (×7): qty 1

## 2021-01-21 MED ORDER — PERFLUTREN LIPID MICROSPHERE
1.0000 mL | INTRAVENOUS | Status: AC | PRN
  Administered 2021-01-21: 2 mL via INTRAVENOUS
  Filled 2021-01-21: qty 10

## 2021-01-21 NOTE — Progress Notes (Addendum)
PROGRESS NOTE  Laura Walker IWP:[id number] DOB: 23-Oct-1930 DOA: 01/20/2021 PCP: [name], MD   LOS: 1 day   Brief narrative:  Laura Walker is a 87 y.o. female with medical history significant of advanced dementia, right upper lobe adenocarcinoma status post radiation 2021, chronic A. fib on Eliquis, CKD stage II, hypertension, OSA on CPAP, presented to the hospital with altered mental status. Daughter at bedside reported patient request staff feed her dinner last night which is abnormal, at baseline she can feed herself, and developed a new cough overnight, and remained sleepy through the night.   EMS was called and patient was found to be in rapid atrial fibrillation.In the ED, patient was noted to be hypoxic 88% on room air, with low grade fever of 100.46F. Chest x-ray was suspicious for bilateral pneumonia.  Patient received a total of 3 L of IV bolus. Cardizem drip was started. Patient was given broad-spectrum IV antibiotics -vancomycin and cefepime  and  was admitted to the hospital for further evaluation and treatment.  Assessment/Plan:   Active Problems:   Severe sepsis (HCC)   A-fib (HCC)  Severe sepsis on presentation. Patient presented with tachycardia, elevated lactate, hypoxia and altered mental status on presentation with possible aspiration pneumonia.  Currently on vancomycin, Rocephin and doxycycline.  Blood cultures negative in less than 24 hours.  Procalcitonin was 0.10.  Ending Legionella, mycoplasma and strep antigen.  CT scan of the chest has been ordered and is pending.  Speech and swallow  A. fib with RVR Previous echocardiogram which showed LVEF 50 to 55%.  Patient initially received Cardizem drip.  CT head scan negative.  Change to Eliquis from heparin drip.  Patient has been receiving digoxin every 6 hour loading dose.  Patient is much controlled at this time.  Check 2D echocardiogram this admission.  Acute metabolic encephalopathy Patient was sleepy on  CPAP.  Patient has been seen by speech therapy who recommended a regular diet.  Acute hypoxic respiratory failure secondary to aspiration pneumonia. Possibility of aspiration pneumonia with acute diastolic heart failure and rapid A. fib causing acute hypoxic respiratory failure.  Currently on BiPAP.  Wean as able.  She was initially on fluids which has been discontinued.  Patient has received 1 dose of IV Lasix.  CKD stage II Patient received IV fluids.  Off fluids at this time.  We will continue to monitor closely.  Lower extremity wounds.  Wound care on board.  DVT prophylaxis:  Heparin drip> eliquis  Code Status: DNR  Family Communication: I spoke with the patient's daughter on the phone and updated her about the clinical condition of the patient.  Status is: Inpatient  Remains inpatient appropriate because:IV treatments appropriate due to intensity of illness or inability to take PO and Inpatient level of care appropriate due to severity of illness   Dispo: The patient is from: SNF              Anticipated d/c is to: SNF              Patient currently is not medically stable to d/c.   Difficult to place patient No   Consultants:  None  Procedures:  BiPAP  Anti-infectives:  Marland Kitchen Vancomycin Rocephin doxycycline 2/26>  Anti-infectives (From admission, onward)   Start     Dose/Rate Route Frequency Ordered Stop   01/22/21 1430  vancomycin (VANCOREADY) IVPB 1500 mg/300 mL  Status:  Discontinued        1,500 mg 150 mL/hr  over 120 Minutes Intravenous Every 48 hours 01/20/21 1457 01/20/21 1840   01/20/21 2300  cefTRIAXone (ROCEPHIN) 2 g in sodium chloride 0.9 % 100 mL IVPB        2 g 200 mL/hr over 30 Minutes Intravenous Every 24 hours 01/20/21 2000     01/20/21 2230  ceFEPIme (MAXIPIME) 2 g in sodium chloride 0.9 % 100 mL IVPB  Status:  Discontinued        2 g 200 mL/hr over 30 Minutes Intravenous Every 12 hours 01/20/21 1500 01/20/21 1840   01/20/21 2000  cefTRIAXone  (ROCEPHIN) 1 g in sodium chloride 0.9 % 100 mL IVPB  Status:  Discontinued        1 g 200 mL/hr over 30 Minutes Intravenous Every 24 hours 01/20/21 1840 01/20/21 2000   01/20/21 1615  doxycycline (VIBRAMYCIN) 100 mg in sodium chloride 0.9 % 250 mL IVPB        100 mg 125 mL/hr over 120 Minutes Intravenous Every 12 hours 01/20/21 1609     01/20/21 1145  ceFEPIme (MAXIPIME) 2 g in sodium chloride 0.9 % 100 mL IVPB        2 g 200 mL/hr over 30 Minutes Intravenous  Once 01/20/21 1134 01/20/21 1244   01/20/21 1145  vancomycin (VANCOREADY) IVPB 2000 mg/400 mL        2,000 mg 200 mL/hr over 120 Minutes Intravenous  Once 01/20/21 1134 01/20/21 1625      Subjective: Today, patient was seen and examined at bedside.  Patient is somnolent on BiPAP, was unable to converse much.  Objective: Vitals:   01/21/21 0835 01/21/21 0915  BP: 119/86 116/80  Pulse: 87   Resp: (!) 0   Temp: 97.9 F (36.6 C)   SpO2: 100%     Intake/Output Summary (Last 24 hours) at 01/21/2021 1121 Last data filed at 01/21/2021 0302 Gross per 24 hour  Intake 183.61 ml  Output 1650 ml  Net -1466.39 ml   Filed Weights   01/20/21 1300 01/21/21 0300  Weight: 102 kg 100.8 kg   Body mass index is 38.14 kg/m.   Physical Exam: GENERAL: Patient is  on BiPAP in mild distress, obese HENT: No scleral pallor or icterus. Pupils equally reactive to light. Oral mucosa is moist NECK: is supple, no gross swelling noted. CHEST  Diminished breath sounds bilaterally.  Coarse breath sounds noted. CVS: S1 and S2 heard, no murmur.  Irregular rhythm ABDOMEN: Soft, non-tender, bowel sounds are present. EXTREMITIES: No edema. CNS: Opens eyes, mumbling and confused on BiPAP. SKIN: warm and dry without rashes.  Data Review: I have personally reviewed the following laboratory data and studies,  CBC: Recent Labs  Lab 01/20/21 1122 01/20/21 1633 01/21/21 0234  WBC 9.5  --  8.9  HGB 14.9 12.9 13.2  HCT 48.8* 38.0 40.2  MCV 93.5  --   91.0  PLT 284  --  275   Basic Metabolic Panel: Recent Labs  Lab 01/20/21 1318 01/20/21 1633 01/21/21 0234  NA 142 139 144  K 4.6 4.4 3.5  CL 108  --  104  CO2 22  --  28  GLUCOSE 167*  --  100*  BUN 22  --  24*  CREATININE 1.16*  --  1.20*  CALCIUM 10.1  --  9.4   Liver Function Tests: Recent Labs  Lab 01/20/21 1318 01/21/21 0234  AST 117* 381*  ALT 116* 340*  ALKPHOS 92 96  BILITOT 1.8* 1.1  PROT 6.6 5.6*  ALBUMIN 3.4* 2.9*   No results for input(s): LIPASE, AMYLASE in the last 168 hours. No results for input(s): AMMONIA in the last 168 hours. Cardiac Enzymes: No results for input(s): CKTOTAL, CKMB, CKMBINDEX, TROPONINI in the last 168 hours. BNP (last 3 results) Recent Labs    01/20/21 1705  BNP 1,608.7*    ProBNP (last 3 results) No results for input(s): PROBNP in the last 8760 hours.  CBG: Recent Labs  Lab 01/20/21 1109  GLUCAP 162*   Recent Results (from the past 240 hour(s))  Blood Culture (routine x 2)     Status: None (Preliminary result)   Collection Time: 01/20/21 11:33 AM   Specimen: BLOOD  Result Value Ref Range Status   Specimen Description BLOOD LEFT ANTECUBITAL  Final   Special Requests   Final    BOTTLES DRAWN AEROBIC AND ANAEROBIC Blood Culture adequate volume   Culture   Final    NO GROWTH < 24 HOURS Performed at [facility], [place] [address], [place], [place] [address]    Report Status PENDING  Incomplete  Blood Culture (routine x 2)     Status: None (Preliminary result)   Collection Time: 01/20/21 11:38 AM   Specimen: BLOOD  Result Value Ref Range Status   Specimen Description BLOOD BLOOD RIGHT HAND  Final   Special Requests   Final    AEROBIC BOTTLE ONLY Blood Culture results may not be optimal due to an inadequate volume of blood received in culture bottles   Culture   Final    NO GROWTH < 24 HOURS Performed at [facility], [place] [address], [place], [place] [address]    Report Status PENDING  Incomplete   Resp Panel by RT-PCR (Flu A&B, Covid) Nasopharyngeal Swab     Status: None   Collection Time: 01/20/21 12:01 PM   Specimen: Nasopharyngeal Swab; Nasopharyngeal(NP) swabs in vial transport medium  Result Value Ref Range Status   SARS Coronavirus 2 by RT PCR NEGATIVE NEGATIVE Final    Comment: (NOTE) SARS-CoV-2 target nucleic acids are NOT DETECTED.  The SARS-CoV-2 RNA is generally detectable in upper respiratory specimens during the acute phase of infection. The lowest concentration of SARS-CoV-2 viral copies this assay can detect is 138 copies/mL. A negative result does not preclude SARS-Cov-2 infection and should not be used as the sole basis for treatment or other patient management decisions. A negative result may occur with  improper specimen collection/handling, submission of specimen other than nasopharyngeal swab, presence of viral mutation(s) within the areas targeted by this assay, and inadequate number of viral copies(<138 copies/mL). A negative result must be combined with clinical observations, patient history, and epidemiological information. The expected result is Negative.  Fact Sheet for Patients:  EntrepreneurPulse.com.au  Fact Sheet for Healthcare Providers:  IncredibleEmployment.be  This test is no t yet approved or cleared by the [place] FDA and  has been authorized for detection and/or diagnosis of SARS-CoV-2 by FDA under an Emergency Use Authorization (EUA). This EUA will remain  in effect (meaning this test can be used) for the duration of the COVID-19 declaration under Section 564(b)(1) of the Act, 21 U.S.C.section 360bbb-3(b)(1), unless the authorization is terminated  or revoked sooner.       Influenza A by PCR NEGATIVE NEGATIVE Final   Influenza B by PCR NEGATIVE NEGATIVE Final    Comment: (NOTE) The Xpert Xpress SARS-CoV-2/FLU/RSV plus assay is intended as an aid in the diagnosis of influenza from  Nasopharyngeal swab  specimens and should not be used as a sole basis for treatment. Nasal washings and aspirates are unacceptable for Xpert Xpress SARS-CoV-2/FLU/RSV testing.  Fact Sheet for Patients: EntrepreneurPulse.com.au  Fact Sheet for Healthcare Providers: IncredibleEmployment.be  This test is not yet approved or cleared by the [place] FDA and has been authorized for detection and/or diagnosis of SARS-CoV-2 by FDA under an Emergency Use Authorization (EUA). This EUA will remain in effect (meaning this test can be used) for the duration of the COVID-19 declaration under Section 564(b)(1) of the Act, 21 U.S.C. section 360bbb-3(b)(1), unless the authorization is terminated or revoked.  Performed at [facility], [place] [address] [place], [place] [address]      Studies: CT HEAD WO CONTRAST  Result Date: 01/20/2021 CLINICAL DATA:  Mental status change. EXAM: CT HEAD WITHOUT CONTRAST TECHNIQUE: Contiguous axial images were obtained from the base of the skull through the vertex without intravenous contrast. COMPARISON:  January 06, 2020 FINDINGS: Brain: No evidence of acute infarction, hemorrhage, hydrocephalus, extra-axial collection or mass lesion/mass effect. Extensive brain parenchymal volume loss and deep white matter microangiopathy. Punctate hypoattenuation in the left thalamus again noted. Vascular: Calcific atherosclerotic disease of the intra cavernous carotid arteries. Skull: Normal. Negative for fracture or focal lesion. Sinuses/Orbits: No acute finding. Other: None. IMPRESSION: 1. No acute intracranial abnormality. 2. Atrophy, chronic microvascular disease. Electronically Signed   By: Fidela Salisbury M.D.   On: 01/20/2021 13:24   CT CHEST WO CONTRAST  Result Date: 01/20/2021 CLINICAL DATA:  Cough, known lung cancer EXAM: CT CHEST WITHOUT CONTRAST TECHNIQUE: Multidetector CT imaging of the chest was performed following the  standard protocol without IV contrast. COMPARISON:  December 20, 2020 FINDINGS: Cardiovascular: Cardiomegaly. Trace pericardial fluid. Three-vessel coronary artery atherosclerotic calcifications. Aortic valve calcifications. Atherosclerotic calcifications of the aorta. Mediastinum/Nodes: Visualized thyroid is unremarkable. No new axillary or mediastinal adenopathy. Lungs/Pleura: Revisualization of an irregular RIGHT upper lobe paramediastinal masslike opacity; exact measurements are difficult to ascertain due to lack of IV contrast but is estimated to measure 4.2 x 2.7 by 5.0 cm (series 3, image 33; series 6, image 76). There may be a degree of superimposed atelectasis contributing to this measurement. Increased moderate bilateral pleural effusions. Increased bibasilar ground-glass opacities. Scattered bilateral atelectasis. Lunate configuration of the trachea. Upper Abdomen: Unchanged nodular thickening of bilateral adrenal glands. Small volume free fluid in the abdomen. Musculoskeletal: Degenerative changes of the thoracic spine. IMPRESSION: 1. Increased moderate bilateral pleural effusions. Increased bibasilar ground-glass opacities. Findings may reflect pulmonary edema or infection. 2. Revisualization of known RIGHT-sided lung cancer. It appears mildly more prominent in comparison to most recent prior, possibly due to superimposed atelectasis. Recommend close attention on follow-up. 3. Cardiomegaly. 4. Small volume free fluid in the abdomen. Aortic Atherosclerosis (ICD10-I70.0). Electronically Signed   By: Valentino Saxon MD   On: 01/20/2021 18:12   DG Chest Port 1 View  Result Date: 01/21/2021 CLINICAL DATA:  Shortness of breath and unresponsive. EXAM: PORTABLE CHEST 1 VIEW COMPARISON:  01/20/2021 FINDINGS: Patient slightly rotated to the right. Lungs are adequately inflated demonstrate stable left base/retrocardiac opacification likely small effusion with atelectasis. Stable mild hazy right base  opacification likely small effusion with atelectasis. Infection in the lung bases is possible. Subtle prominence of the central pulmonary vessels likely a degree of vascular congestion. Borderline stable cardiomegaly. Remainder of the exam is unchanged. IMPRESSION: 1. Stable bibasilar opacification left worse than right likely small effusions with atelectasis. Infection in the lung bases is possible. 2.  Borderline stable cardiomegaly with suggestion of minimal vascular congestion. Electronically Signed   By: Marin Olp M.D.   On: 01/21/2021 08:35   DG Chest Port 1 View  Result Date: 01/20/2021 CLINICAL DATA:  Altered mental status beginning last night EXAM: PORTABLE CHEST 1 VIEW COMPARISON:  Chest CT December 20, 2020 and chest radiograph January 31, 2020. FINDINGS: The heart size and mediastinal contours are partially obscured. Right suprahilar nodular consolidation not significantly changed from chest CT December 20, 2020 given differences in technique. Slightly increased small-moderate left pleural effusion with a similar small right pleural effusion. Left basilar consolidation. Chronic parenchymal lung changes. The visualized skeletal structures are unchanged. IMPRESSION: 1. Similar small right and slightly increased small-moderate left pleural effusion with a right basilar consolidation, which may represent atelectasis or infection. 2. Right suprahilar nodular consolidation similar to chest CT December 20, 2020 given differences in technique. Electronically Signed   By: Dahlia Bailiff MD   On: 01/20/2021 12:27   US Abdomen Limited RUQ (LIVER/GB)  Result Date: 01/20/2021 CLINICAL DATA:  Elevated LFTs, assess for cholecystitis and biliary dilation EXAM: ULTRASOUND ABDOMEN LIMITED RIGHT UPPER QUADRANT COMPARISON:  None. FINDINGS: Gallbladder: Cholelithiasis without pericholecystic fluid or wall thickening visualized. No sonographic Murphy sign noted by sonographer. Common bile duct: Diameter: 3 mm Liver: No  focal lesion identified. Coarsened hepatic echotexture with diffusely increased parenchymal echogenicity. Portal vein is patent on color Doppler imaging with normal direction of blood flow towards the liver. Other: Smart pleural effusion. IMPRESSION: 1. Cholelithiasis without sonographic evidence of acute cholecystitis. 2. Coarsened hepatic echotexture with diffusely increased parenchymal echogenicity. This is a nonspecific finding but is most commonly seen with fatty infiltration of the liver. There are no obvious focal hepatic lesions. Electronically Signed   By: Dahlia Bailiff MD   On: 01/20/2021 16:35      Flora Lipps, MD  [facility] 01/21/2021  If 7PM-7AM, please contact night-coverage

## 2021-01-21 NOTE — Progress Notes (Signed)
[place] for heparin >> apixaban Indication: atrial fibrillation  Allergies  Allergen Reactions  . Tramadol Nausea Only    Patient Measurements: Height: 5\' 4"  (162.6 cm) Weight: 100.8 kg (222 lb 3.6 oz) IBW/kg (Calculated) : 54.7 Heparin Dosing Weight: 80kg  Vital Signs: Temp: 97.9 F (36.6 C) (02/27 0835) Temp Source: Axillary (02/27 0835) BP: 116/80 (02/27 0915) Pulse Rate: 87 (02/27 0835)  Labs: Recent Labs    01/20/21 1122 01/20/21 1318 01/20/21 1633 01/21/21 0234 01/21/21 1154  HGB 14.9  --  12.9 13.2  --   HCT 48.8*  --  38.0 40.2  --   PLT 284  --   --  219  --   APTT  --   --   --  53* 69*  HEPARINUNFRC  --   --   --  2.10*  --   CREATININE  --  1.16*  --  1.20*  --   TROPONINIHS  --  52*  --   --   --     Estimated Creatinine Clearance: 37.4 mL/min (A) (by C-G formula based on SCr of 1.2 mg/dL (H)).  Assessment: 87 y.o. female with h/o Afib, Eliquis on hold, for IV heparin. Speech has now seen patient and cleared to take meds PO. Pharmacy consulted to switch IV heparin back to apixaban. Per admission med rec, patient takes apixaban 5 mg BID. However patient told RN that she bruises very easily on Eliquis and they turn into blood blisters (wound has seen the patient for these), so her daughter splits the tablets in half (to equal 2.5 mg BID) and that helped. She doesn't quite qualify for reduced 2.5 mg dosing for afib (she is >30 years old but Scr not quite >1.5 and she doesn't meet the weight requirement). Discussed this with MD--ok to restart at reduced 2.5 mg BID dosing. CBC wnl.   Goal of Therapy:  Monitor platelets by anticoagulation protocol: Yes   Plan:  Discontinue IV heparin when give first dose of apixaban Start apixaban 2.5 mg PO BID Monitor CBC, s/s bleeding  Rebbeca Paul, PharmD PGY1 Pharmacy Resident 01/21/2021 1:29 PM  Please check [email or web address] for unit-specific pharmacy phone numbers.

## 2021-01-21 NOTE — Progress Notes (Signed)
  Echocardiogram 2D Echocardiogram has been performed.  Laura Walker 01/21/2021, 2:16 PM

## 2021-01-21 NOTE — Consult Note (Signed)
WOC Nurse Consult Note: Reason for Consult: full thickness wounds to LLE, lateral aspect.  Likely related to Eloquis. Patient assessment compled while daughter in room Wound type:full thickness Pressure Injury POA: N/A Measurement: Left lateral LE with 4 wounds: three circular and distal to other, linear wounds.  The largest of the 3 circular wounds measures 2.2cm x 2cm x 0.2cm with 80% red, 20% black wet tissue. The linear wound measures 0.4cm x 4cm x 0.2cm with red, wet wound bed. Wound bed:As noted above Drainage (amount, consistency, odor) serous, small to moderate amount Periwound: intact Dressing procedure/placement/frequency: Cleanse wounds with NS, pat gently dry. Cover wounds with size-appropriate pieces of silver hydrofiber (Aquacel Advantage, Kellie Simmering [phone]) for antimicrobial and absorbent properties. Top with silicone foam dressing or alternatively, may cover with ABD pad and secure with a few turns of Kerlix roll gauze dressing. Change every M/W/F.  Note:  Remove by saturating silver hydrofiber dressing with NS. Prevalon boots and sacral prophylactic dressing are provided for pressure injury prevention.  [place] nursing team will not follow, but will remain available to this patient, the nursing and medical teams.  Please re-consult if needed. Thanks, Maudie Flakes, MSN, RN, [place], Arther Abbott  Pager# [phone]

## 2021-01-21 NOTE — Progress Notes (Signed)
[place] for heparin  Indication: atrial fibrillation  Allergies  Allergen Reactions  . Tramadol Nausea Only    Patient Measurements: Height: 5\' 4"  (162.6 cm) Weight: 100.8 kg (222 lb 3.6 oz) IBW/kg (Calculated) : 54.7 Heparin Dosing Weight: 80kg  Vital Signs: Temp: 98 F (36.7 C) (02/27 0300) Temp Source: Axillary (02/27 0300) BP: 123/103 (02/27 0300) Pulse Rate: 90 (02/27 0300)  Labs: Recent Labs    01/20/21 1122 01/20/21 1318 01/20/21 1633 01/21/21 0234  HGB 14.9  --  12.9 13.2  HCT 48.8*  --  38.0 40.2  PLT 284  --   --  219  APTT  --   --   --  53*  HEPARINUNFRC  --   --   --  2.10*  CREATININE  --  1.16*  --  1.20*  TROPONINIHS  --  52*  --   --     Estimated Creatinine Clearance: 37.4 mL/min (A) (by C-G formula based on SCr of 1.2 mg/dL (H)).  Assessment: 87 y.o. female with h/o Afib, Eliquis on hold, for heparin  Goal of Therapy:  Heparin level 0.3-0.7 units/ml aPTT 66-100sec  seconds Monitor platelets by anticoagulation protocol: Yes   Plan:  Increase Heparin 1100 units/hr APTT in 8 hours  .Phillis Knack, PharmD, BCPS  01/21/2021 4:58 AM

## 2021-01-21 NOTE — Evaluation (Signed)
Clinical/Bedside Swallow Evaluation Patient Details  Name: Laura Walker MRN: [id number] Date of Birth: May 20, 1930  Today's Date: 01/21/2021 Time: SLP Start Time (ACUTE ONLY): 1937 SLP Stop Time (ACUTE ONLY): 1238 SLP Time Calculation (min) (ACUTE ONLY): 15 min  Past Medical History:  Past Medical History:  Diagnosis Date  . Anxiety   . Arthritis   . CKD (chronic kidney disease)   . Dementia ([place])   . Depression   . Dysrhythmia    new onset AFIB.   Marland Kitchen Hypercholesteremia   . Memory loss   . OSA (obstructive sleep apnea)    CPAP  . Osteopenia    Past Surgical History:  Past Surgical History:  Procedure Laterality Date  . ANKLE SURGERY Left    as child, fracture  . BUNIONECTOMY    . VIDEO BRONCHOSCOPY WITH ENDOBRONCHIAL NAVIGATION N/A 02/02/2020   Procedure: VIDEO BRONCHOSCOPY WITH ENDOBRONCHIAL NAVIGATION;  Surgeon: Melrose Nakayama, MD;  Location: [facility];  Service: Thoracic;  Laterality: N/A;   HPI:  Laura Walker is a 87 y.o. female with medical history significant of advanced dementia, right upper lobe adenocarcinoma status post radiation 2021, chronic A. fib on Eliquis, CKD stage II, hypertension, OSA on CPAP, presented with altered mentation.  CXR reported "Stable bibasilar opacification left worse than right likely small effusions with atelectasis".   Assessment / Plan / Recommendation Clinical Impression  Pt was seen for a bedside swallow evaluation and she presents with suspected minimal-mild dysphagia.  Pt was encountered awake/alert with daughter at bedside.  Daughter reported that the pt consumes regular solids and thin liquids at baseline.  Oral mechanism examination was unremarkable.  Pt consumed thin liquid, puree, and regular solids.  She was noted to be impulsive, taking large, serial sips of her drink and large bites of solids.  She benefited from verbal and tactile cues to limit bolus size and to slow rate of intake. Pt  exhibited delayed throat clearing  following large, serial sips of thin liquid, but no overt s/sx of aspiration were observed with small, single sips.  Mastication of regular solids was timely and no clinical s/sx of aspiration were observed with puree or regular solids.  Recommend initiation of regular solids and thin liquids with medication administered whole in puree (cut/crush large pills).  Pt will benefit from supervision during meals to assist with feeding and to cue for compensatory strategies.  SLP will briefly f/u to monitor diet tolerance.  SLP Visit Diagnosis: Dysphagia, unspecified (R13.10)    Aspiration Risk  Mild aspiration risk    Diet Recommendation Thin liquid;Regular   Liquid Administration via: Cup;Straw Medication Administration: Whole meds with puree Supervision: Staff to assist with self feeding;Full supervision/cueing for compensatory strategies Compensations: Minimize environmental distractions;Slow rate;Small sips/bites Postural Changes: Seated upright at 90 degrees    Other  Recommendations Oral Care Recommendations: Oral care BID   Follow up Recommendations None      Frequency and Duration min 1 x/week  1 week     Swallow Study   General HPI: Laura Walker is a 87 y.o. female with medical history significant of advanced dementia, right upper lobe adenocarcinoma status post radiation 2021, chronic A. fib on Eliquis, CKD stage II, hypertension, OSA on CPAP, presented with altered mentation.  CXR reported "Stable bibasilar opacification left worse than right likely small effusions with atelectasis" Type of Study: Bedside Swallow Evaluation Previous Swallow Assessment: None Diet Prior to this Study: NPO Temperature Spikes Noted: Yes Respiratory Status: Nasal cannula History  of Recent Intubation: No Behavior/Cognition: Alert;Cooperative;Pleasant mood Oral Cavity Assessment: Within Functional Limits Oral Care Completed by SLP: No Oral Cavity - Dentition: Adequate natural dentition Vision:  Functional for self-feeding Self-Feeding Abilities: Able to feed self;Needs set up Patient Positioning: Upright in bed Baseline Vocal Quality: Normal Volitional Swallow: Able to elicit    Oral/Motor/Sensory Function Overall Oral Motor/Sensory Function: Within functional limits   Ice Chips Ice chips: Within functional limits Presentation: Spoon   Thin Liquid Thin Liquid: Impaired Presentation: Straw Pharyngeal  Phase Impairments: Throat Clearing - Delayed    Nectar Thick Nectar Thick Liquid: Not tested   Honey Thick Honey Thick Liquid: Not tested   Puree Puree: Within functional limits Presentation: Spoon   Solid     Solid: Within functional limits     Laura Walker., M.S., [place] Office: [phone]  Elvia Collum Kenlynn Houde 01/21/2021,12:49 PM

## 2021-01-22 DIAGNOSIS — I5020 Unspecified systolic (congestive) heart failure: Secondary | ICD-10-CM | POA: Diagnosis not present

## 2021-01-22 DIAGNOSIS — I4891 Unspecified atrial fibrillation: Secondary | ICD-10-CM

## 2021-01-22 DIAGNOSIS — J189 Pneumonia, unspecified organism: Secondary | ICD-10-CM

## 2021-01-22 DIAGNOSIS — I519 Heart disease, unspecified: Secondary | ICD-10-CM

## 2021-01-22 LAB — CBC
HCT: 38.2 % (ref 36.0–46.0)
Hemoglobin: 12.4 g/dL (ref 12.0–15.0)
MCH: 29.6 pg (ref 26.0–34.0)
MCHC: 32.5 g/dL (ref 30.0–36.0)
MCV: 91.2 fL (ref 80.0–100.0)
Platelets: 210 10*3/uL (ref 150–400)
RBC: 4.19 MIL/uL (ref 3.87–5.11)
RDW: 14.6 % (ref 11.5–15.5)
WBC: 7.7 10*3/uL (ref 4.0–10.5)
nRBC: 0 % (ref 0.0–0.2)

## 2021-01-22 LAB — COMPREHENSIVE METABOLIC PANEL
ALT: 265 U/L — ABNORMAL HIGH (ref 0–44)
AST: 145 U/L — ABNORMAL HIGH (ref 15–41)
Albumin: 2.6 g/dL — ABNORMAL LOW (ref 3.5–5.0)
Alkaline Phosphatase: 81 U/L (ref 38–126)
Anion gap: 8 (ref 5–15)
BUN: 18 mg/dL (ref 8–23)
CO2: 27 mmol/L (ref 22–32)
Calcium: 9.2 mg/dL (ref 8.9–10.3)
Chloride: 106 mmol/L (ref 98–111)
Creatinine, Ser: 0.88 mg/dL (ref 0.44–1.00)
GFR, Estimated: >60 mL/min (ref >60)
Glucose, Bld: 88 mg/dL (ref 70–99)
Potassium: 3.6 mmol/L (ref 3.5–5.1)
Sodium: 141 mmol/L (ref 135–145)
Total Bilirubin: 1.4 mg/dL — ABNORMAL HIGH (ref 0.3–1.2)
Total Protein: 5.3 g/dL — ABNORMAL LOW (ref 6.5–8.1)

## 2021-01-22 LAB — PHOSPHORUS: Phosphorus: 2.8 mg/dL (ref 2.5–4.6)

## 2021-01-22 LAB — PROCALCITONIN: Procalcitonin: <0.1 ng/mL

## 2021-01-22 LAB — LEGIONELLA PNEUMOPHILA SEROGP 1 UR AG: L. pneumophila Serogp 1 Ur Ag: NEGATIVE

## 2021-01-22 LAB — MAGNESIUM: Magnesium: 1.8 mg/dL (ref 1.7–2.4)

## 2021-01-22 MED ORDER — FUROSEMIDE 10 MG/ML IJ SOLN
40.0000 mg | Freq: Every day | INTRAMUSCULAR | Status: DC
  Administered 2021-01-22 – 2021-01-24 (×3): 40 mg via INTRAVENOUS
  Filled 2021-01-22 (×4): qty 4

## 2021-01-22 MED ORDER — METOPROLOL SUCCINATE ER 25 MG PO TB24
25.0000 mg | ORAL_TABLET | Freq: Every day | ORAL | Status: DC
  Administered 2021-01-22 – 2021-01-24 (×3): 25 mg via ORAL
  Filled 2021-01-22 (×4): qty 1

## 2021-01-22 MED ORDER — SPIRONOLACTONE 12.5 MG HALF TABLET
12.5000 mg | ORAL_TABLET | Freq: Every day | ORAL | Status: DC
  Administered 2021-01-22 – 2021-01-30 (×9): 12.5 mg via ORAL
  Filled 2021-01-22 (×9): qty 1

## 2021-01-22 MED ORDER — DIGOXIN 125 MCG PO TABS
0.1250 mg | ORAL_TABLET | Freq: Every day | ORAL | Status: DC
  Administered 2021-01-22 – 2021-01-24 (×3): 0.125 mg via ORAL
  Filled 2021-01-22 (×3): qty 1
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
## 2021-01-22 NOTE — NC FL2 (Signed)
[place] LEVEL OF CARE SCREENING TOOL     IDENTIFICATION  Patient Name: Laura Walker Birthdate: 10/30/1930 Sex: female Admission Date (Current Location): 01/20/2021  [facility] and [place] Number:  Herbalist and Address:  [facility] [place]. [facility], [place] [address] [place], [place] [address]      Provider Number: [id number]  Attending Physician Name and Address:  Flora Lipps, MD  Relative Name and Phone Number:  Luciana Axe [phone]    Current Level of Care: SNF Recommended Level of Care: [place] Prior Approval Number:    Date Approved/Denied:   PASRR Number:    Discharge Plan: Other (Comment) ([facility])    Current Diagnoses: Patient Active Problem List   Diagnosis Date Noted  . Severe sepsis ([place]) 01/20/2021  . A-fib ([place]) 01/20/2021  . Acute febrile illness   . Adenocarcinoma of right lung, stage 1 ([place]) 02/07/2020  . Left bundle branch block 02/02/2020  . Persistent atrial fibrillation ([place]) 02/02/2020  . Lung mass 01/06/2020  . Mild dementia ([place]) 08/24/2018    Orientation RESPIRATION BLADDER Height & Weight     Self  O2 ([employer]) Incontinent,External catheter Weight: 223 lb 1.7 oz (101.2 kg) Height:  5\' 4"  (162.6 cm)  BEHAVIORAL SYMPTOMS/MOOD NEUROLOGICAL BOWEL NUTRITION STATUS      Continent Diet (Thin liquid; regular)  AMBULATORY STATUS COMMUNICATION OF NEEDS Skin   Extensive Assist   Other (Comment) (Open wound/incision non pressure, left leg  01/20/21)                       Personal Care Assistance Level of Assistance  Bathing,Feeding,Dressing Bathing Assistance: Maximum assistance Feeding assistance: Limited assistance Dressing Assistance: Maximum assistance     Functional Limitations Info  Sight,Hearing,Speech Sight Info: Impaired Hearing Info: Impaired Speech Info: Adequate    SPECIAL CARE FACTORS FREQUENCY                       Contractures Contractures  Info: Not present    Additional Factors Info  Code Status,Allergies,Psychotropic Code Status Info: DNR Allergies Info: Tramadol Psychotropic Info: buPROPion (WELLBUTRIN) tablet 75 mg every morning, DULoxetine (CYMBALTA) DR capsule 30 mg daily,         Current Medications (01/22/2021):  This is the current hospital active medication list Current Facility-Administered Medications  Medication Dose Route Frequency Provider Last Rate Last Admin  . 0.9 %  sodium chloride infusion  250 mL Intravenous PRN Wynetta Fines T, MD      . acetaminophen (TYLENOL) tablet 650 mg  650 mg Oral Q4H PRN Wynetta Fines T, MD      . apixaban [facility]) tablet 2.5 mg  2.5 mg Oral BID Rebbeca Paul B, RPH   2.5 mg at 01/22/21 1059  . buPROPion [facility]) tablet 75 mg  75 mg Oral q morning Wynetta Fines T, MD   75 mg at 01/22/21 1059  . cefTRIAXone (ROCEPHIN) 2 g in sodium chloride 0.9 % 100 mL IVPB  2 g Intravenous Q24H Wynetta Fines T, MD 200 mL/hr at 01/21/21 2257 2 g at 01/21/21 2257  . digoxin (LANOXIN) tablet 0.125 mg  0.125 mg Oral Daily O'Neal, Cassie Freer, MD      . donepezil (ARICEPT) tablet 10 mg  10 mg Oral QHS Wynetta Fines T, MD   10 mg at 01/21/21 2255  . doxycycline (VIBRAMYCIN) 100 mg in sodium chloride 0.9 % 250 mL IVPB  100 mg Intravenous  Q12H Lequita Halt, MD 125 mL/hr at 01/22/21 0452 100 mg at 01/22/21 0452  . DULoxetine (CYMBALTA) DR capsule 30 mg  30 mg Oral Daily Wynetta Fines T, MD   30 mg at 01/22/21 1058  . furosemide (LASIX) injection 40 mg  40 mg Intravenous Daily O'Neal, Cassie Freer, MD      . ipratropium (ATROVENT) nebulizer solution 0.5 mg  0.5 mg Nebulization Q6H Wynetta Fines T, MD   0.5 mg at 01/22/21 4503  . ipratropium (ATROVENT) nebulizer solution 0.5 mg  0.5 mg Nebulization Q6H PRN Wynetta Fines T, MD      . memantine [facility]) tablet 10 mg  10 mg Oral BID Wynetta Fines T, MD   10 mg at 01/22/21 1059  . metoprolol succinate (TOPROL-XL) 24 hr tablet 25 mg  25 mg Oral Daily O'Neal, Cassie Freer, MD      . metoprolol tartrate (LOPRESSOR) injection 2.5 mg  2.5 mg Intravenous Q6H PRN Wynetta Fines T, MD      . ondansetron [facility]) injection 4 mg  4 mg Intravenous Q6H PRN Wynetta Fines T, MD      . oxybutynin (DITROPAN-XL) 24 hr tablet 5 mg  5 mg Oral Q1500 Wynetta Fines T, MD   5 mg at 01/21/21 1512  . sodium chloride flush (NS) 0.9 % injection 3 mL  3 mL Intravenous Q12H Wynetta Fines T, MD   3 mL at 01/21/21 2255  . sodium chloride flush (NS) 0.9 % injection 3 mL  3 mL Intravenous PRN Wynetta Fines T, MD      . spironolactone (ALDACTONE) tablet 12.5 mg  12.5 mg Oral Daily O'Neal, Cassie Freer, MD         Discharge Medications: Please see discharge summary for a list of discharge medications.  Relevant Imaging Results:  Relevant Lab Results:   Additional Information SSN [id number]  Loreta Ave, LCSWA

## 2021-01-22 NOTE — Progress Notes (Signed)
PROGRESS NOTE  Laura Walker:[id number] DOB: Apr 10, 1930 DOA: 01/20/2021 PCP: [name], MD   LOS: 2 days   Brief narrative:  Laura Walker is a 87 y.o. female with medical history significant for dementia, right upper lobe adenocarcinoma status post radiation 2021, chronic A. fib on Eliquis, CKD stage II, hypertension, OSA on CPAP, presented to the hospital with altered mental status. Daughter at bedside reported patient request staff feed Walker dinner last night which is abnormal, at baseline she can feed herself, and developed a new cough overnight, and remained sleepy through the night.   EMS was called and patient was found to be in rapid atrial fibrillation.In the ED, patient was noted to be hypoxic 88% on room air, with low grade fever of 100.62F. Chest x-ray was suspicious for bilateral pneumonia.  Patient received a total of 3 L of IV bolus. Cardizem drip was started. Patient was given broad-spectrum IV antibiotics -vancomycin and cefepime  and  was admitted to the hospital for further evaluation and treatment.  Assessment/Plan:   Active Problems:   Severe sepsis (HCC)   A-fib (HCC)  Severe sepsis on presentation. Sepsis likely secondary to pneumonia.  on  Rocephin and doxycycline.  Blood cultures negative in less than 24 hours.  Procalcitonin was 0.10.  Pending Legionella, mycoplasma and strep antigen.    Speech and swallow recommended regular diet.    ParoxysmalA. fib with RVR Has improved at this time.  Previous echocardiogram on 05/31/2020 showed LVEF 50 to 55%.  Repeat 2D echocardiogram from 01/21/2021 showed left ventricular ejection fraction of 20 to 25% with a small pericardial effusion.  Continue on Eliquis.  Received  loading dose of digoxin x 4 doses by admitting provider.  Rate is much controlled at this time.  Will consult cardiology due to reduced LV function.  CHA2DS2-VASc score of 5.  Acute systolic congestive heart failure. A  2D echocardiogram showed reduced  LV function.  Will get cardiology consultation.  Acute metabolic encephalopathy Improving.  Acute hypoxic respiratory failure secondary to  pneumonia. Possibility of pneumonia with acute systolic heart failure and rapid A. fib causing acute hypoxic respiratory failure.    Patient  received 1 dose of IV Lasix.  Patient does have history of non-small cell lung cancer of the right upper lung status post radiation treatment.  Regular supplemental oxygen.  Will wean oxygen as able.  CKD stage II Patient received IV fluids.  Off IV fluids at this time.  We will continue to monitor closely.  Lower extremity wounds.  Wound care on board.  DVT prophylaxis: apixaban (ELIQUIS) tablet 2.5 mg Start: 01/21/21 1330 apixaban (ELIQUIS) tablet 2.5 mg eliquis  Code Status: DNR  Family Communication: I again with the patient's daughter on the phone today  Status is: Inpatient  Remains inpatient appropriate because:IV treatments appropriate due to intensity of illness or inability to take PO and Inpatient level of care appropriate due to severity of illness   Dispo: The patient is from: SNF              Anticipated d/c is to: SNF              Patient currently is not medically stable to d/c.   Anticipated date of discharge 2 to 3 days   Difficult to place patient No  Consultants:  None  Procedures:  BiPAP  Anti-infectives:  Marland Kitchen Rocephin 2/26> . Doxycycline 2/26>  Anti-infectives (From admission, onward)   Start     Dose/Rate  Route Frequency Ordered Stop   01/22/21 1430  vancomycin (VANCOREADY) IVPB 1500 mg/300 mL  Status:  Discontinued        1,500 mg 150 mL/hr over 120 Minutes Intravenous Every 48 hours 01/20/21 1457 01/20/21 1840   01/20/21 2300  cefTRIAXone (ROCEPHIN) 2 g in sodium chloride 0.9 % 100 mL IVPB        2 g 200 mL/hr over 30 Minutes Intravenous Every 24 hours 01/20/21 2000     01/20/21 2230  ceFEPIme (MAXIPIME) 2 g in sodium chloride 0.9 % 100 mL IVPB  Status:   Discontinued        2 g 200 mL/hr over 30 Minutes Intravenous Every 12 hours 01/20/21 1500 01/20/21 1840   01/20/21 2000  cefTRIAXone (ROCEPHIN) 1 g in sodium chloride 0.9 % 100 mL IVPB  Status:  Discontinued        1 g 200 mL/hr over 30 Minutes Intravenous Every 24 hours 01/20/21 1840 01/20/21 2000   01/20/21 1615  doxycycline (VIBRAMYCIN) 100 mg in sodium chloride 0.9 % 250 mL IVPB        100 mg 125 mL/hr over 120 Minutes Intravenous Every 12 hours 01/20/21 1609     01/20/21 1145  ceFEPIme (MAXIPIME) 2 g in sodium chloride 0.9 % 100 mL IVPB        2 g 200 mL/hr over 30 Minutes Intravenous  Once 01/20/21 1134 01/20/21 1244   01/20/21 1145  vancomycin (VANCOREADY) IVPB 2000 mg/400 mL        2,000 mg 200 mL/hr over 120 Minutes Intravenous  Once 01/20/21 1134 01/20/21 1625     Subjective: Today, patient was seen and examined at bedside.  Patient appears to be more alert awake today.  Denies any pain, nausea vomiting.  Denies dyspnea or chest pain.  Complains of mild cough.  Objective: Vitals:   01/22/21 0454 01/22/21 0742  BP: (!) 128/96   Pulse: 96 80  Resp: 19 (!) 21  Temp: 98.5 F (36.9 C)   SpO2: 100% 98%    Intake/Output Summary (Last 24 hours) at 01/22/2021 0758 Last data filed at 01/22/2021 0452 Gross per 24 hour  Intake 2451 ml  Output 300 ml  Net 2151 ml   Filed Weights   01/20/21 1300 01/21/21 0300 01/22/21 0454  Weight: 102 kg 100.8 kg 101.2 kg   Body mass index is 38.3 kg/m.   Physical Exam: General: Obese built, more alert awake and communicative today.  On nasal cannula oxygen HENT:   No scleral pallor or icterus noted. Oral mucosa is moist.  Chest: .  Diminished breath sounds bilaterally.  Coarse breath sounds noted. CVS: S1 &S2 heard. No murmur.   Abdomen: Soft, nontender, nondistended.  Bowel sounds are heard.   Extremities: No cyanosis, clubbing or edema.  Peripheral pulses are palpable.  Left lateral lower extremity with  wounds. Psych: Alert, awake  and communicative, CNS:  No cranial nerve deficits.  Power equal in all extremities.   Skin: Warm and dry.  Left lower extremity with wounds  Data Review: I have personally reviewed the following laboratory data and studies,  CBC: Recent Labs  Lab 01/20/21 1122 01/20/21 1633 01/21/21 0234 01/22/21 0133  WBC 9.5  --  8.9 7.7  HGB 14.9 12.9 13.2 12.4  HCT 48.8* 38.0 40.2 38.2  MCV 93.5  --  91.0 91.2  PLT 284  --  219 161   Basic Metabolic Panel: Recent Labs  Lab 01/20/21 1318 01/20/21 1633 01/21/21 0234  01/22/21 0133  NA 142 139 144 141  K 4.6 4.4 3.5 3.6  CL 108  --  104 106  CO2 22  --  28 27  GLUCOSE 167*  --  100* 88  BUN 22  --  24* 18  CREATININE 1.16*  --  1.20* 0.88  CALCIUM 10.1  --  9.4 9.2  MG  --   --   --  1.8  PHOS  --   --   --  2.8   Liver Function Tests: Recent Labs  Lab 01/20/21 1318 01/21/21 0234 01/22/21 0133  AST 117* 381* 145*  ALT 116* 340* 265*  ALKPHOS 92 96 81  BILITOT 1.8* 1.1 1.4*  PROT 6.6 5.6* 5.3*  ALBUMIN 3.4* 2.9* 2.6*   No results for input(s): LIPASE, AMYLASE in the last 168 hours. No results for input(s): AMMONIA in the last 168 hours. Cardiac Enzymes: No results for input(s): CKTOTAL, CKMB, CKMBINDEX, TROPONINI in the last 168 hours. BNP (last 3 results) Recent Labs    01/20/21 1705 01/21/21 0234  BNP 1,608.7* 1,367.3*    ProBNP (last 3 results) No results for input(s): PROBNP in the last 8760 hours.  CBG: Recent Labs  Lab 01/20/21 1109  GLUCAP 162*   Recent Results (from the past 240 hour(s))  Blood Culture (routine x 2)     Status: None (Preliminary result)   Collection Time: 01/20/21 11:33 AM   Specimen: BLOOD  Result Value Ref Range Status   Specimen Description BLOOD LEFT ANTECUBITAL  Final   Special Requests   Final    BOTTLES DRAWN AEROBIC AND ANAEROBIC Blood Culture adequate volume   Culture   Final    NO GROWTH < 24 HOURS Performed at [facility], [place] [address], [place], [place]  [address]    Report Status PENDING  Incomplete  Blood Culture (routine x 2)     Status: None (Preliminary result)   Collection Time: 01/20/21 11:38 AM   Specimen: BLOOD  Result Value Ref Range Status   Specimen Description BLOOD BLOOD RIGHT HAND  Final   Special Requests   Final    AEROBIC BOTTLE ONLY Blood Culture results may not be optimal due to an inadequate volume of blood received in culture bottles   Culture   Final    NO GROWTH < 24 HOURS Performed at [facility], [place] [address] [place], [place] [address]    Report Status PENDING  Incomplete  Resp Panel by RT-PCR (Flu A&B, Covid) Nasopharyngeal Swab     Status: None   Collection Time: 01/20/21 12:01 PM   Specimen: Nasopharyngeal Swab; Nasopharyngeal(NP) swabs in vial transport medium  Result Value Ref Range Status   SARS Coronavirus 2 by RT PCR NEGATIVE NEGATIVE Final    Comment: (NOTE) SARS-CoV-2 target nucleic acids are NOT DETECTED.  The SARS-CoV-2 RNA is generally detectable in upper respiratory specimens during the acute phase of infection. The lowest concentration of SARS-CoV-2 viral copies this assay can detect is 138 copies/mL. A negative result does not preclude SARS-Cov-2 infection and should not be used as the sole basis for treatment or other patient management decisions. A negative result may occur with  improper specimen collection/handling, submission of specimen other than nasopharyngeal swab, presence of viral mutation(s) within the areas targeted by this assay, and inadequate number of viral copies(<138 copies/mL). A negative result must be combined with clinical observations, patient history, and epidemiological information. The expected result is Negative.  Fact Sheet  for Patients:  EntrepreneurPulse.com.au  Fact Sheet for Healthcare Providers:  IncredibleEmployment.be  This test is no t yet approved or cleared by the [place] FDA and  has been  authorized for detection and/or diagnosis of SARS-CoV-2 by FDA under an Emergency Use Authorization (EUA). This EUA will remain  in effect (meaning this test can be used) for the duration of the COVID-19 declaration under Section 564(b)(1) of the Act, 21 U.S.C.section 360bbb-3(b)(1), unless the authorization is terminated  or revoked sooner.       Influenza A by PCR NEGATIVE NEGATIVE Final   Influenza B by PCR NEGATIVE NEGATIVE Final    Comment: (NOTE) The Xpert Xpress SARS-CoV-2/FLU/RSV plus assay is intended as an aid in the diagnosis of influenza from Nasopharyngeal swab specimens and should not be used as a sole basis for treatment. Nasal washings and aspirates are unacceptable for Xpert Xpress SARS-CoV-2/FLU/RSV testing.  Fact Sheet for Patients: EntrepreneurPulse.com.au  Fact Sheet for Healthcare Providers: IncredibleEmployment.be  This test is not yet approved or cleared by the [place] FDA and has been authorized for detection and/or diagnosis of SARS-CoV-2 by FDA under an Emergency Use Authorization (EUA). This EUA will remain in effect (meaning this test can be used) for the duration of the COVID-19 declaration under Section 564(b)(1) of the Act, 21 U.S.C. section 360bbb-3(b)(1), unless the authorization is terminated or revoked.  Performed at [facility], [place] [address], [place], [place] [address]      Studies: CT HEAD WO CONTRAST  Result Date: 01/20/2021 CLINICAL DATA:  Mental status change. EXAM: CT HEAD WITHOUT CONTRAST TECHNIQUE: Contiguous axial images were obtained from the base of the skull through the vertex without intravenous contrast. COMPARISON:  January 06, 2020 FINDINGS: Brain: No evidence of acute infarction, hemorrhage, hydrocephalus, extra-axial collection or mass lesion/mass effect. Extensive brain parenchymal volume loss and deep white matter microangiopathy. Punctate hypoattenuation in the left  thalamus again noted. Vascular: Calcific atherosclerotic disease of the intra cavernous carotid arteries. Skull: Normal. Negative for fracture or focal lesion. Sinuses/Orbits: No acute finding. Other: None. IMPRESSION: 1. No acute intracranial abnormality. 2. Atrophy, chronic microvascular disease. Electronically Signed   By: Fidela Salisbury M.D.   On: 01/20/2021 13:24   CT CHEST WO CONTRAST  Result Date: 01/20/2021 CLINICAL DATA:  Cough, known lung cancer EXAM: CT CHEST WITHOUT CONTRAST TECHNIQUE: Multidetector CT imaging of the chest was performed following the standard protocol without IV contrast. COMPARISON:  December 20, 2020 FINDINGS: Cardiovascular: Cardiomegaly. Trace pericardial fluid. Three-vessel coronary artery atherosclerotic calcifications. Aortic valve calcifications. Atherosclerotic calcifications of the aorta. Mediastinum/Nodes: Visualized thyroid is unremarkable. No new axillary or mediastinal adenopathy. Lungs/Pleura: Revisualization of an irregular RIGHT upper lobe paramediastinal masslike opacity; exact measurements are difficult to ascertain due to lack of IV contrast but is estimated to measure 4.2 x 2.7 by 5.0 cm (series 3, image 33; series 6, image 76). There may be a degree of superimposed atelectasis contributing to this measurement. Increased moderate bilateral pleural effusions. Increased bibasilar ground-glass opacities. Scattered bilateral atelectasis. Lunate configuration of the trachea. Upper Abdomen: Unchanged nodular thickening of bilateral adrenal glands. Small volume free fluid in the abdomen. Musculoskeletal: Degenerative changes of the thoracic spine. IMPRESSION: 1. Increased moderate bilateral pleural effusions. Increased bibasilar ground-glass opacities. Findings may reflect pulmonary edema or infection. 2. Revisualization of known RIGHT-sided lung cancer. It appears mildly more prominent in comparison to most recent prior, possibly due to superimposed atelectasis.  Recommend close attention on follow-up. 3. Cardiomegaly. 4. Small volume free  fluid in the abdomen. Aortic Atherosclerosis (ICD10-I70.0). Electronically Signed   By: Valentino Saxon MD   On: 01/20/2021 18:12   DG Chest Port 1 View  Result Date: 01/21/2021 CLINICAL DATA:  Shortness of breath and unresponsive. EXAM: PORTABLE CHEST 1 VIEW COMPARISON:  01/20/2021 FINDINGS: Patient slightly rotated to the right. Lungs are adequately inflated demonstrate stable left base/retrocardiac opacification likely small effusion with atelectasis. Stable mild hazy right base opacification likely small effusion with atelectasis. Infection in the lung bases is possible. Subtle prominence of the central pulmonary vessels likely a degree of vascular congestion. Borderline stable cardiomegaly. Remainder of the exam is unchanged. IMPRESSION: 1. Stable bibasilar opacification left worse than right likely small effusions with atelectasis. Infection in the lung bases is possible. 2. Borderline stable cardiomegaly with suggestion of minimal vascular congestion. Electronically Signed   By: Marin Olp M.D.   On: 01/21/2021 08:35   DG Chest Port 1 View  Result Date: 01/20/2021 CLINICAL DATA:  Altered mental status beginning last night EXAM: PORTABLE CHEST 1 VIEW COMPARISON:  Chest CT December 20, 2020 and chest radiograph January 31, 2020. FINDINGS: The heart size and mediastinal contours are partially obscured. Right suprahilar nodular consolidation not significantly changed from chest CT December 20, 2020 given differences in technique. Slightly increased small-moderate left pleural effusion with a similar small right pleural effusion. Left basilar consolidation. Chronic parenchymal lung changes. The visualized skeletal structures are unchanged. IMPRESSION: 1. Similar small right and slightly increased small-moderate left pleural effusion with a right basilar consolidation, which may represent atelectasis or infection. 2. Right  suprahilar nodular consolidation similar to chest CT December 20, 2020 given differences in technique. Electronically Signed   By: Dahlia Bailiff MD   On: 01/20/2021 12:27   ECHOCARDIOGRAM COMPLETE  Result Date: 01/21/2021    ECHOCARDIOGRAM REPORT   Patient Name:   Laura Walker Date of Exam: 01/21/2021 Medical Rec #:  [id number]       Height:       64.0 in Accession #:    [id number]      Weight:       222.2 lb Date of Birth:  10-Nov-1930       BSA:          2.046 m Patient Age:    28 years        BP:           116/80 mmHg Patient Gender: F               HR:           86 bpm. Exam Location:  Inpatient Procedure: 2D Echo, Cardiac Doppler, Color Doppler and Intracardiac            Opacification Agent Indications:    CHF-Acute Diastolic A19.37  History:        Patient has prior history of Echocardiogram examinations, most                 recent 05/31/2020. Risk Factors:Dyslipidemia and Former Smoker.                 CKD.  Sonographer:    Vickie Epley RDCS Referring Phys: [id number] [place]  1. Left ventricular ejection fraction, by estimation, is 20 to 25%. The left ventricle has severely decreased function. The left ventricle demonstrates global hypokinesis. Left ventricular diastolic parameters are indeterminate.  2. Right ventricular systolic function is normal. The right ventricular size is mildly enlarged. There is normal pulmonary  artery systolic pressure.  3. Left atrial size was mildly dilated.  4. Right atrial size was mildly dilated.  5. A small pericardial effusion is present. The pericardial effusion is circumferential. Large pleural effusion in the left lateral region.  6. The mitral valve is abnormal. Moderate mitral valve regurgitation.  7. The aortic valve is tricuspid. There is mild calcification of the aortic valve. There is mild thickening of the aortic valve. Aortic valve regurgitation is not visualized. No aortic stenosis is present.  8. The inferior vena cava is normal in size with  <50% respiratory variability, suggesting right atrial pressure of 8 mmHg. FINDINGS  Left Ventricle: Left ventricular ejection fraction, by estimation, is 20 to 25%. The left ventricle has severely decreased function. The left ventricle demonstrates global hypokinesis. Definity contrast agent was given IV to delineate the left ventricular endocardial borders. The left ventricular internal cavity size was normal in size. There is no left ventricular hypertrophy. Left ventricular diastolic parameters are indeterminate. Right Ventricle: The right ventricular size is mildly enlarged. No increase in right ventricular wall thickness. Right ventricular systolic function is normal. There is normal pulmonary artery systolic pressure. The tricuspid regurgitant velocity is 2.46  m/s, and with an assumed right atrial pressure of 8 mmHg, the estimated right ventricular systolic pressure is 93.8 mmHg. Left Atrium: Left atrial size was mildly dilated. Right Atrium: Right atrial size was mildly dilated. Pericardium: A small pericardial effusion is present. The pericardial effusion is circumferential. Mitral Valve: The mitral valve is abnormal. Moderate mitral valve regurgitation. Tricuspid Valve: The tricuspid valve is normal in structure. Tricuspid valve regurgitation is mild . No evidence of tricuspid stenosis. Aortic Valve: The aortic valve is tricuspid. There is mild calcification of the aortic valve. There is mild thickening of the aortic valve. There is mild aortic valve annular calcification. Aortic valve regurgitation is not visualized. No aortic stenosis  is present. Aortic valve mean gradient measures 2.2 mmHg. Aortic valve peak gradient measures 4.5 mmHg. Aortic valve area, by VTI measures 2.03 cm. Pulmonic Valve: The pulmonic valve was not well visualized. Pulmonic valve regurgitation is not visualized. No evidence of pulmonic stenosis. Aorta: The aortic root is normal in size and structure. Pulmonary Artery: Moderate  pulmonary HTN, PASP is 43 mmHg. Venous: The inferior vena cava is normal in size with less than 50% respiratory variability, suggesting right atrial pressure of 8 mmHg. IAS/Shunts: No atrial level shunt detected by color flow Doppler. Additional Comments: There is a large pleural effusion in the left lateral region.  LEFT VENTRICLE PLAX 2D LVIDd:         5.70 cm LVOT diam:     2.20 cm LV SV:         32 LV SV Index:   16 LVOT Area:     3.80 cm  RIGHT VENTRICLE TAPSE (M-mode): 1.6 cm LEFT ATRIUM             Index       RIGHT ATRIUM           Index LA Vol (A2C):   65.7 ml 32.11 ml/m RA Area:     22.40 cm LA Vol (A4C):   57.7 ml 28.20 ml/m RA Volume:   69.70 ml  34.07 ml/m LA Biplane Vol: 62.1 ml 30.35 ml/m  AORTIC VALVE AV Area (Vmax):    2.24 cm AV Area (Vmean):   2.41 cm AV Area (VTI):     2.03 cm AV Vmax:  105.87 cm/s AV Vmean:          69.151 cm/s AV VTI:            0.159 m AV Peak Grad:      4.5 mmHg AV Mean Grad:      2.2 mmHg LVOT Vmax:         62.26 cm/s LVOT Vmean:        43.837 cm/s LVOT VTI:          0.085 m LVOT/AV VTI ratio: 0.54  AORTA Ao Root diam: 3.60 cm Ao Asc diam:  3.50 cm MR Peak grad: 70.6 mmHg   TRICUSPID VALVE MR Mean grad: 44.0 mmHg   TR Peak grad:   24.2 mmHg MR Vmax:      420.00 cm/s TR Vmax:        246.00 cm/s MR Vmean:     311.0 cm/s                           SHUNTS                           Systemic VTI:  0.09 m                           Systemic Diam: 2.20 cm Carlyle Dolly MD Electronically signed by Carlyle Dolly MD Signature Date/Time: 01/21/2021/2:26:43 PM    Final    US Abdomen Limited RUQ (LIVER/GB)  Result Date: 01/20/2021 CLINICAL DATA:  Elevated LFTs, assess for cholecystitis and biliary dilation EXAM: ULTRASOUND ABDOMEN LIMITED RIGHT UPPER QUADRANT COMPARISON:  None. FINDINGS: Gallbladder: Cholelithiasis without pericholecystic fluid or wall thickening visualized. No sonographic Murphy sign noted by sonographer. Common bile duct: Diameter: 3 mm Liver: No  focal lesion identified. Coarsened hepatic echotexture with diffusely increased parenchymal echogenicity. Portal vein is patent on color Doppler imaging with normal direction of blood flow towards the liver. Other: Smart pleural effusion. IMPRESSION: 1. Cholelithiasis without sonographic evidence of acute cholecystitis. 2. Coarsened hepatic echotexture with diffusely increased parenchymal echogenicity. This is a nonspecific finding but is most commonly seen with fatty infiltration of the liver. There are no obvious focal hepatic lesions. Electronically Signed   By: Dahlia Bailiff MD   On: 01/20/2021 16:35      Flora Lipps, MD  [facility] 01/22/2021  If 7PM-7AM, please contact night-coverage

## 2021-01-23 DIAGNOSIS — I4819 Other persistent atrial fibrillation: Secondary | ICD-10-CM

## 2021-01-23 DIAGNOSIS — I447 Left bundle-branch block, unspecified: Secondary | ICD-10-CM

## 2021-01-23 DIAGNOSIS — I4821 Permanent atrial fibrillation: Secondary | ICD-10-CM

## 2021-01-23 DIAGNOSIS — N182 Chronic kidney disease, stage 2 (mild): Secondary | ICD-10-CM | POA: Diagnosis present

## 2021-01-23 DIAGNOSIS — I5021 Acute systolic (congestive) heart failure: Secondary | ICD-10-CM

## 2021-01-23 DIAGNOSIS — I4891 Unspecified atrial fibrillation: Secondary | ICD-10-CM | POA: Diagnosis not present

## 2021-01-23 DIAGNOSIS — I5020 Unspecified systolic (congestive) heart failure: Secondary | ICD-10-CM | POA: Diagnosis not present

## 2021-01-23 LAB — CBC
HCT: 40.5 % (ref 36.0–46.0)
Hemoglobin: 12.9 g/dL (ref 12.0–15.0)
MCH: 29.3 pg (ref 26.0–34.0)
MCHC: 31.9 g/dL (ref 30.0–36.0)
MCV: 92 fL (ref 80.0–100.0)
Platelets: 237 10*3/uL (ref 150–400)
RBC: 4.4 MIL/uL (ref 3.87–5.11)
RDW: 14.6 % (ref 11.5–15.5)
WBC: 7.9 10*3/uL (ref 4.0–10.5)
nRBC: 0 % (ref 0.0–0.2)

## 2021-01-23 LAB — COMPREHENSIVE METABOLIC PANEL
ALT: 207 U/L — ABNORMAL HIGH (ref 0–44)
AST: 78 U/L — ABNORMAL HIGH (ref 15–41)
Albumin: 2.6 g/dL — ABNORMAL LOW (ref 3.5–5.0)
Alkaline Phosphatase: 78 U/L (ref 38–126)
Anion gap: 9 (ref 5–15)
BUN: 18 mg/dL (ref 8–23)
CO2: 29 mmol/L (ref 22–32)
Calcium: 9.3 mg/dL (ref 8.9–10.3)
Chloride: 103 mmol/L (ref 98–111)
Creatinine, Ser: 0.97 mg/dL (ref 0.44–1.00)
GFR, Estimated: 56 mL/min — ABNORMAL LOW (ref >60)
Glucose, Bld: 89 mg/dL (ref 70–99)
Potassium: 3.5 mmol/L (ref 3.5–5.1)
Sodium: 141 mmol/L (ref 135–145)
Total Bilirubin: 0.8 mg/dL (ref 0.3–1.2)
Total Protein: 5.4 g/dL — ABNORMAL LOW (ref 6.5–8.1)

## 2021-01-23 LAB — MYCOPLASMA PNEUMONIAE ANTIBODY, IGM: Mycoplasma pneumo IgM: <770 U/mL (ref 0–769)

## 2021-01-23 LAB — MAGNESIUM: Magnesium: 1.7 mg/dL (ref 1.7–2.4)

## 2021-01-23 LAB — PHOSPHORUS: Phosphorus: 3.2 mg/dL (ref 2.5–4.6)

## 2021-01-23 MED ORDER — IPRATROPIUM BROMIDE 0.02 % IN SOLN
0.5000 mg | Freq: Two times a day (BID) | RESPIRATORY_TRACT | Status: DC
  Administered 2021-01-23 – 2021-01-28 (×11): 0.5 mg via RESPIRATORY_TRACT
  Filled 2021-01-23 (×11): qty 2.5

## 2021-01-23 MED ORDER — LOSARTAN POTASSIUM 25 MG PO TABS
25.0000 mg | ORAL_TABLET | Freq: Every day | ORAL | Status: DC
  Administered 2021-01-23 – 2021-01-30 (×8): 25 mg via ORAL
  Filled 2021-01-23 (×8): qty 1

## 2021-01-23 NOTE — Plan of Care (Signed)
  Problem: Coping: Goal: Level of anxiety will decrease Outcome: Progressing   Problem: Elimination: Goal: Will not experience complications related to urinary retention Outcome: Progressing   Problem: Pain Managment: Goal: General experience of comfort will improve Outcome: Progressing   

## 2021-01-23 NOTE — Progress Notes (Addendum)
PROGRESS NOTE  Laura Walker XTK:[id number] DOB: 02-28-1930 DOA: 01/20/2021 PCP: [name], MD   LOS: 3 days   Brief narrative:  Laura Walker is a 87 y.o. female with medical history significant for dementia, right upper lobe adenocarcinoma status post radiation 2021, chronic A. fib on Eliquis, CKD stage II, hypertension, OSA on CPAP, presented to the hospital with altered mental status. Daughter at bedside reported patient request staff feed her dinner last night which is abnormal, at baseline she can feed herself, and developed a new cough overnight, and remained sleepy through the night.   EMS was called and patient was found to be in rapid atrial fibrillation.In the ED, patient was noted to be hypoxic 88% on room air, with low grade fever of 100.28F. Chest x-ray was suspicious for bilateral pneumonia.  Patient received a total of 3 L of IV bolus. Cardizem drip was started. Patient was given broad-spectrum IV antibiotics -vancomycin and cefepime  and  was admitted to the hospital for further evaluation and treatment.  During hospitalization, patient was given Rocephin and doxycycline.  She was noted to have acute systolic congestive heart failure with significantly impaired LV function.  Cardiology was consulted who recommended continuation of medication and titration rather than aggressive ischemic work-up.  Assessment/Plan:   Principal Problem:   Severe sepsis (HCC) Active Problems:   Left bundle branch block   Persistent atrial fibrillation (HCC)   A-fib (HCC)   Acute systolic CHF (congestive heart failure) (HCC)   CKD (chronic kidney disease), stage II  Severe sepsis on presentation. Sepsis thought to be secondary to pneumonia.  on  Rocephin and doxycycline.  We will continue to complete the course.  Blood cultures negative in 3 days.  Procalcitonin was 0.10.  Negative Legionella urinary antigen, mycoplasma body and strep antigen.    Speech and swallow recommended regular  diet.    ParoxysmalA. fib with RVR Has improved at this time.  Previous echocardiogram on 05/31/2020 showed LVEF 50 to 55%.  Repeat 2D echocardiogram from 01/21/2021 showed left ventricular ejection fraction of 20 to 25% with a small pericardial effusion.  Continue Eliquis.  Received  loading dose of digoxin x 4 doses by admitting provider.  Rate is much controlled at this time.  Cardiology was consulted.  Patient has been started on low-dose of beta-blockers, Aldactone, losartan and digoxin at this time.  CHA2DS2-VASc score of 5.  We will follow cardiology recommendation.  New onset acute systolic congestive heart failure. Likely secondary to A. fib with RVR with left ventricular dyssynchrony A  2D echocardiogram showed reduced LV function.  Patient is decompensated with JVD, peripheral edema and crackles.  We will continue IV diuretics.  Cardiology on board.  Continue strict intake and output charting, daily weights.  Still volume overloaded.  Acute metabolic encephalopathy on the background of dementia. Improving.  History of advanced dementia.  Mostly bedbound.  Continue Namenda, duloxetine, donepezil  Acute hypoxic respiratory failure secondary to  Pneumonia/history of heart failure.Marland Kitchen Possibility of pneumonia with acute systolic heart failure and rapid A. fib causing acute hypoxic respiratory failure.    Patient does have history of non-small cell lung cancer of the right upper lung status post radiation treatment.  On Lasix 40 mg IV daily.  Initially required BiPAP.  Currently off BiPAP and is on 2 L of oxygen by nasal cannula  CKD stage II Patient initially received IV fluids.  Off IV fluids at this time.  We will continue to monitor closely.  Left lower extremity wounds.  Wound care on board.  Elevated LFT from congestive hepatomegaly.  Improving with diuresis.  DVT prophylaxis: apixaban (ELIQUIS) tablet 2.5 mg Start: 01/21/21 1330 apixaban (ELIQUIS) tablet 2.5 mg eliquis  Code Status:  DNR  Family Communication: None today.  Spoke with the patient's daughter [name] on the phone yesterday.   Status is: Inpatient  Remains inpatient appropriate because:IV treatments appropriate due to intensity of illness or inability to take PO and Inpatient level of care appropriate due to severity of illness, on IV diuresis, systolic cardiomyopathy, hypoxic respiratory failure  Dispo: The patient is from: SNF              Anticipated d/c is to: SNF              Patient currently is not medically stable to d/c.   Anticipated date of discharge 2 to 3 days   Difficult to place patient No  Consultants:  Cardiology  Procedures:  BiPAP  Anti-infectives:  Marland Kitchen Rocephin 2/26> . Doxycycline 2/26>  Anti-infectives (From admission, onward)   Start     Dose/Rate Route Frequency Ordered Stop   01/22/21 1430  vancomycin (VANCOREADY) IVPB 1500 mg/300 mL  Status:  Discontinued        1,500 mg 150 mL/hr over 120 Minutes Intravenous Every 48 hours 01/20/21 1457 01/20/21 1840   01/20/21 2300  cefTRIAXone (ROCEPHIN) 2 g in sodium chloride 0.9 % 100 mL IVPB        2 g 200 mL/hr over 30 Minutes Intravenous Every 24 hours 01/20/21 2000     01/20/21 2230  ceFEPIme (MAXIPIME) 2 g in sodium chloride 0.9 % 100 mL IVPB  Status:  Discontinued        2 g 200 mL/hr over 30 Minutes Intravenous Every 12 hours 01/20/21 1500 01/20/21 1840   01/20/21 2000  cefTRIAXone (ROCEPHIN) 1 g in sodium chloride 0.9 % 100 mL IVPB  Status:  Discontinued        1 g 200 mL/hr over 30 Minutes Intravenous Every 24 hours 01/20/21 1840 01/20/21 2000   01/20/21 1615  doxycycline (VIBRAMYCIN) 100 mg in sodium chloride 0.9 % 250 mL IVPB        100 mg 125 mL/hr over 120 Minutes Intravenous Every 12 hours 01/20/21 1609     01/20/21 1145  ceFEPIme (MAXIPIME) 2 g in sodium chloride 0.9 % 100 mL IVPB        2 g 200 mL/hr over 30 Minutes Intravenous  Once 01/20/21 1134 01/20/21 1244   01/20/21 1145  vancomycin (VANCOREADY)  IVPB 2000 mg/400 mL        2,000 mg 200 mL/hr over 120 Minutes Intravenous  Once 01/20/21 1134 01/20/21 1625     Subjective: Today, patient was seen and examined at bedside.  Patient has underlying dementia and is a poor historian.  Denies any pain, nausea.  She is not really sure why she is in the hospital.  Denies overt shortness of breath but has mild cough.  Objective: Vitals:   01/23/21 0950 01/23/21 1000  BP:  124/90  Pulse: 93   Resp:    Temp:    SpO2:      Intake/Output Summary (Last 24 hours) at 01/23/2021 1147 Last data filed at 01/23/2021 0093 Gross per 24 hour  Intake 671.76 ml  Output 2300 ml  Net -1628.24 ml   Filed Weights   01/21/21 0300 01/22/21 0454 01/23/21 0610  Weight: 100.8 kg 101.2 kg 100.2 kg  Body mass index is 37.92 kg/m.   Physical Exam: General: Obese built, alert awake and communicative but underlying dementia, disoriented.  On nasal cannula oxygen, not in obvious distress HENT:   No scleral pallor or icterus noted. Oral mucosa is moist.  Distended neck veins. Chest: .  Diminished breath sounds bilaterally.  Coarse breath sounds noted.  Basal crackles noted. CVS: S1 &S2 heard. No murmur.  Irregular rhythm. Abdomen: Soft, nontender, nondistended.  Bowel sounds are heard.   Extremities: No cyanosis, clubbing but bilateral lower extremity edema more on the left, peripheral pulses are palpable.  Left lateral lower extremity with  wounds. Psych: Alert, awake and communicative, CNS:  No cranial nerve deficits.  Moving extremities. Skin: Warm and dry.  Left lower extremity with wounds  Data Review: I have personally reviewed the following laboratory data and studies,  CBC: Recent Labs  Lab 01/20/21 1122 01/20/21 1633 01/21/21 0234 01/22/21 0133 01/23/21 0237  WBC 9.5  --  8.9 7.7 7.9  HGB 14.9 12.9 13.2 12.4 12.9  HCT 48.8* 38.0 40.2 38.2 40.5  MCV 93.5  --  91.0 91.2 92.0  PLT 284  --  219 210 263   Basic Metabolic Panel: Recent Labs   Lab 01/20/21 1318 01/20/21 1633 01/21/21 0234 01/22/21 0133 01/23/21 0237  NA 142 139 144 141 141  K 4.6 4.4 3.5 3.6 3.5  CL 108  --  104 106 103  CO2 22  --  28 27 29   GLUCOSE 167*  --  100* 88 89  BUN 22  --  24* 18 18  CREATININE 1.16*  --  1.20* 0.88 0.97  CALCIUM 10.1  --  9.4 9.2 9.3  MG  --   --   --  1.8 1.7  PHOS  --   --   --  2.8 3.2   Liver Function Tests: Recent Labs  Lab 01/20/21 1318 01/21/21 0234 01/22/21 0133 01/23/21 0237  AST 117* 381* 145* 78*  ALT 116* 340* 265* 207*  ALKPHOS 92 96 81 78  BILITOT 1.8* 1.1 1.4* 0.8  PROT 6.6 5.6* 5.3* 5.4*  ALBUMIN 3.4* 2.9* 2.6* 2.6*   No results for input(s): LIPASE, AMYLASE in the last 168 hours. No results for input(s): AMMONIA in the last 168 hours. Cardiac Enzymes: No results for input(s): CKTOTAL, CKMB, CKMBINDEX, TROPONINI in the last 168 hours. BNP (last 3 results) Recent Labs    01/20/21 1705 01/21/21 0234  BNP 1,608.7* 1,367.3*    ProBNP (last 3 results) No results for input(s): PROBNP in the last 8760 hours.  CBG: Recent Labs  Lab 01/20/21 1109  GLUCAP 162*   Recent Results (from the past 240 hour(s))  Blood Culture (routine x 2)     Status: None (Preliminary result)   Collection Time: 01/20/21 11:33 AM   Specimen: BLOOD  Result Value Ref Range Status   Specimen Description BLOOD LEFT ANTECUBITAL  Final   Special Requests   Final    BOTTLES DRAWN AEROBIC AND ANAEROBIC Blood Culture adequate volume   Culture   Final    NO GROWTH 3 DAYS Performed at [facility], [address], [place], [place] [address]    Report Status PENDING  Incomplete  Blood Culture (routine x 2)     Status: None (Preliminary result)   Collection Time: 01/20/21 11:38 AM   Specimen: BLOOD  Result Value Ref Range Status   Specimen Description BLOOD BLOOD RIGHT HAND  Final   Special Requests  Final    AEROBIC BOTTLE ONLY Blood Culture results may not be optimal due to an inadequate volume of blood  received in culture bottles   Culture   Final    NO GROWTH 3 DAYS Performed at [facility], [place] [address] [place], [place] [address]    Report Status PENDING  Incomplete  Resp Panel by RT-PCR (Flu A&B, Covid) Nasopharyngeal Swab     Status: None   Collection Time: 01/20/21 12:01 PM   Specimen: Nasopharyngeal Swab; Nasopharyngeal(NP) swabs in vial transport medium  Result Value Ref Range Status   SARS Coronavirus 2 by RT PCR NEGATIVE NEGATIVE Final    Comment: (NOTE) SARS-CoV-2 target nucleic acids are NOT DETECTED.  The SARS-CoV-2 RNA is generally detectable in upper respiratory specimens during the acute phase of infection. The lowest concentration of SARS-CoV-2 viral copies this assay can detect is 138 copies/mL. A negative result does not preclude SARS-Cov-2 infection and should not be used as the sole basis for treatment or other patient management decisions. A negative result may occur with  improper specimen collection/handling, submission of specimen other than nasopharyngeal swab, presence of viral mutation(s) within the areas targeted by this assay, and inadequate number of viral copies(<138 copies/mL). A negative result must be combined with clinical observations, patient history, and epidemiological information. The expected result is Negative.  Fact Sheet for Patients:  EntrepreneurPulse.com.au  Fact Sheet for Healthcare Providers:  IncredibleEmployment.be  This test is no t yet approved or cleared by the [place] FDA and  has been authorized for detection and/or diagnosis of SARS-CoV-2 by FDA under an Emergency Use Authorization (EUA). This EUA will remain  in effect (meaning this test can be used) for the duration of the COVID-19 declaration under Section 564(b)(1) of the Act, 21 U.S.C.section 360bbb-3(b)(1), unless the authorization is terminated  or revoked sooner.       Influenza A by PCR NEGATIVE NEGATIVE  Final   Influenza B by PCR NEGATIVE NEGATIVE Final    Comment: (NOTE) The Xpert Xpress SARS-CoV-2/FLU/RSV plus assay is intended as an aid in the diagnosis of influenza from Nasopharyngeal swab specimens and should not be used as a sole basis for treatment. Nasal washings and aspirates are unacceptable for Xpert Xpress SARS-CoV-2/FLU/RSV testing.  Fact Sheet for Patients: EntrepreneurPulse.com.au  Fact Sheet for Healthcare Providers: IncredibleEmployment.be  This test is not yet approved or cleared by the [place] FDA and has been authorized for detection and/or diagnosis of SARS-CoV-2 by FDA under an Emergency Use Authorization (EUA). This EUA will remain in effect (meaning this test can be used) for the duration of the COVID-19 declaration under Section 564(b)(1) of the Act, 21 U.S.C. section 360bbb-3(b)(1), unless the authorization is terminated or revoked.  Performed at [facility], [place] [address], [place], [place] [address]      Studies: ECHOCARDIOGRAM COMPLETE  Result Date: 01/21/2021    ECHOCARDIOGRAM REPORT   Patient Name:   FRANCESA EUGENIO Date of Exam: 01/21/2021 Medical Rec #:  [id number]       Height:       64.0 in Accession #:    [id number]      Weight:       222.2 lb Date of Birth:  [birth year]       BSA:          2.046 m Patient Age:    25 years        BP:  116/80 mmHg Patient Gender: F               HR:           86 bpm. Exam Location:  Inpatient Procedure: 2D Echo, Cardiac Doppler, Color Doppler and Intracardiac            Opacification Agent Indications:    CHF-Acute Diastolic A83.41  History:        Patient has prior history of Echocardiogram examinations, most                 recent 05/31/2020. Risk Factors:Dyslipidemia and Former Smoker.                 CKD.  Sonographer:    Vickie Epley RDCS Referring Phys: [id number] [place]  1. Left ventricular ejection fraction, by estimation, is 20 to 25%. The  left ventricle has severely decreased function. The left ventricle demonstrates global hypokinesis. Left ventricular diastolic parameters are indeterminate.  2. Right ventricular systolic function is normal. The right ventricular size is mildly enlarged. There is normal pulmonary artery systolic pressure.  3. Left atrial size was mildly dilated.  4. Right atrial size was mildly dilated.  5. A small pericardial effusion is present. The pericardial effusion is circumferential. Large pleural effusion in the left lateral region.  6. The mitral valve is abnormal. Moderate mitral valve regurgitation.  7. The aortic valve is tricuspid. There is mild calcification of the aortic valve. There is mild thickening of the aortic valve. Aortic valve regurgitation is not visualized. No aortic stenosis is present.  8. The inferior vena cava is normal in size with <50% respiratory variability, suggesting right atrial pressure of 8 mmHg. FINDINGS  Left Ventricle: Left ventricular ejection fraction, by estimation, is 20 to 25%. The left ventricle has severely decreased function. The left ventricle demonstrates global hypokinesis. Definity contrast agent was given IV to delineate the left ventricular endocardial borders. The left ventricular internal cavity size was normal in size. There is no left ventricular hypertrophy. Left ventricular diastolic parameters are indeterminate. Right Ventricle: The right ventricular size is mildly enlarged. No increase in right ventricular wall thickness. Right ventricular systolic function is normal. There is normal pulmonary artery systolic pressure. The tricuspid regurgitant velocity is 2.46  m/s, and with an assumed right atrial pressure of 8 mmHg, the estimated right ventricular systolic pressure is 98.9 mmHg. Left Atrium: Left atrial size was mildly dilated. Right Atrium: Right atrial size was mildly dilated. Pericardium: A small pericardial effusion is present. The pericardial effusion is  circumferential. Mitral Valve: The mitral valve is abnormal. Moderate mitral valve regurgitation. Tricuspid Valve: The tricuspid valve is normal in structure. Tricuspid valve regurgitation is mild . No evidence of tricuspid stenosis. Aortic Valve: The aortic valve is tricuspid. There is mild calcification of the aortic valve. There is mild thickening of the aortic valve. There is mild aortic valve annular calcification. Aortic valve regurgitation is not visualized. No aortic stenosis  is present. Aortic valve mean gradient measures 2.2 mmHg. Aortic valve peak gradient measures 4.5 mmHg. Aortic valve area, by VTI measures 2.03 cm. Pulmonic Valve: The pulmonic valve was not well visualized. Pulmonic valve regurgitation is not visualized. No evidence of pulmonic stenosis. Aorta: The aortic root is normal in size and structure. Pulmonary Artery: Moderate pulmonary HTN, PASP is 43 mmHg. Venous: The inferior vena cava is normal in size with less than 50% respiratory variability, suggesting right atrial pressure of 8 mmHg. IAS/Shunts: No atrial  level shunt detected by color flow Doppler. Additional Comments: There is a large pleural effusion in the left lateral region.  LEFT VENTRICLE PLAX 2D LVIDd:         5.70 cm LVOT diam:     2.20 cm LV SV:         32 LV SV Index:   16 LVOT Area:     3.80 cm  RIGHT VENTRICLE TAPSE (M-mode): 1.6 cm LEFT ATRIUM             Index       RIGHT ATRIUM           Index LA Vol (A2C):   65.7 ml 32.11 ml/m RA Area:     22.40 cm LA Vol (A4C):   57.7 ml 28.20 ml/m RA Volume:   69.70 ml  34.07 ml/m LA Biplane Vol: 62.1 ml 30.35 ml/m  AORTIC VALVE AV Area (Vmax):    2.24 cm AV Area (Vmean):   2.41 cm AV Area (VTI):     2.03 cm AV Vmax:           105.87 cm/s AV Vmean:          69.151 cm/s AV VTI:            0.159 m AV Peak Grad:      4.5 mmHg AV Mean Grad:      2.2 mmHg LVOT Vmax:         62.26 cm/s LVOT Vmean:        43.837 cm/s LVOT VTI:          0.085 m LVOT/AV VTI ratio: 0.54  AORTA Ao  Root diam: 3.60 cm Ao Asc diam:  3.50 cm MR Peak grad: 70.6 mmHg   TRICUSPID VALVE MR Mean grad: 44.0 mmHg   TR Peak grad:   24.2 mmHg MR Vmax:      420.00 cm/s TR Vmax:        246.00 cm/s MR Vmean:     311.0 cm/s                           SHUNTS                           Systemic VTI:  0.09 m                           Systemic Diam: 2.20 cm Carlyle Dolly MD Electronically signed by Carlyle Dolly MD Signature Date/Time: 01/21/2021/2:26:43 PM    Final       Flora Lipps, MD  [facility] 01/23/2021  If 7PM-7AM, please contact night-coverage

## 2021-01-23 NOTE — Progress Notes (Signed)
Progress Note  Patient Name: Laura Walker Date of Encounter: 01/23/2021  Primary Cardiologist: Dr. Candee Furbish, MD   Subjective   Remains pleasantly confused this morning. Reports breathing better. No other complaints.   Inpatient Medications    Scheduled Meds: . apixaban  2.5 mg Oral BID  . buPROPion  75 mg Oral q morning  . digoxin  0.125 mg Oral Daily  . donepezil  10 mg Oral QHS  . DULoxetine  30 mg Oral Daily  . furosemide  40 mg Intravenous Daily  . ipratropium  0.5 mg Nebulization BID  . memantine  10 mg Oral BID  . metoprolol succinate  25 mg Oral Daily  . oxybutynin  5 mg Oral Q1500  . sodium chloride flush  3 mL Intravenous Q12H  . spironolactone  12.5 mg Oral Daily   Continuous Infusions: . sodium chloride    . cefTRIAXone (ROCEPHIN)  IV 2 g (01/22/21 2239)  . doxycycline (VIBRAMYCIN) IV 100 mg (01/23/21 0421)   PRN Meds: sodium chloride, acetaminophen, ipratropium, metoprolol tartrate, ondansetron (ZOFRAN) IV, sodium chloride flush   Vital Signs    Vitals:   01/22/21 1725 01/22/21 2019 01/23/21 0006 01/23/21 0610  BP: 118/70 (!) 148/84 119/87 (!) 129/96  Pulse: 84 69 (!) 57 92  Resp: 16 18    Temp: 99.1 F (37.3 C) 99.8 F (37.7 C) 99 F (37.2 C) 97.6 F (36.4 C)  TempSrc: Axillary Oral Oral Oral  SpO2: 97% 96% 99%   Weight:    100.2 kg  Height:        Intake/Output Summary (Last 24 hours) at 01/23/2021 0709 Last data filed at 01/23/2021 1610 Gross per 24 hour  Intake 671.76 ml  Output 2300 ml  Net -1628.24 ml   Filed Weights   01/21/21 0300 01/22/21 0454 01/23/21 0610  Weight: 100.8 kg 101.2 kg 100.2 kg    Physical Exam   General: Elderly, NAD Neck: Negative for carotid bruits. No JVD Lungs: Diminished in bilateral lobes. Breathing is unlabored. Cardiovascular: Irregularly irregular. Abdomen: Soft, non-tender, non-distended. No obvious abdominal masses. Extremities: L>R BLE edema.  Radial pulses 2+ bilaterally Neuro: Alert and  oriented to self only. No focal deficits. No facial asymmetry. MAE spontaneously. Psych: Responds to questions somewhat  appropriately with normal affect.    Labs    Chemistry Recent Labs  Lab 01/21/21 0234 01/22/21 0133 01/23/21 0237  NA 144 141 141  K 3.5 3.6 3.5  CL 104 106 103  CO2 28 27 29   GLUCOSE 100* 88 89  BUN 24* 18 18  CREATININE 1.20* 0.88 0.97  CALCIUM 9.4 9.2 9.3  PROT 5.6* 5.3* 5.4*  ALBUMIN 2.9* 2.6* 2.6*  AST 381* 145* 78*  ALT 340* 265* 207*  ALKPHOS 96 81 78  BILITOT 1.1 1.4* 0.8  GFRNONAA 44* >60 56*  ANIONGAP 12 8 9      Hematology Recent Labs  Lab 01/21/21 0234 01/22/21 0133 01/23/21 0237  WBC 8.9 7.7 7.9  RBC 4.42 4.19 4.40  HGB 13.2 12.4 12.9  HCT 40.2 38.2 40.5  MCV 91.0 91.2 92.0  MCH 29.9 29.6 29.3  MCHC 32.8 32.5 31.9  RDW 14.5 14.6 14.6  PLT 219 210 237    Cardiac EnzymesNo results for input(s): TROPONINI in the last 168 hours. No results for input(s): TROPIPOC in the last 168 hours.   BNP Recent Labs  Lab 01/20/21 1705 01/21/21 0234  BNP 1,608.7* 1,367.3*     DDimer No results  for input(s): DDIMER in the last 168 hours.   Radiology    DG Chest Port 1 View  Result Date: 01/21/2021 CLINICAL DATA:  Shortness of breath and unresponsive. EXAM: PORTABLE CHEST 1 VIEW COMPARISON:  01/20/2021 FINDINGS: Patient slightly rotated to the right. Lungs are adequately inflated demonstrate stable left base/retrocardiac opacification likely small effusion with atelectasis. Stable mild hazy right base opacification likely small effusion with atelectasis. Infection in the lung bases is possible. Subtle prominence of the central pulmonary vessels likely a degree of vascular congestion. Borderline stable cardiomegaly. Remainder of the exam is unchanged. IMPRESSION: 1. Stable bibasilar opacification left worse than right likely small effusions with atelectasis. Infection in the lung bases is possible. 2. Borderline stable cardiomegaly with  suggestion of minimal vascular congestion. Electronically Signed   By: Marin Olp M.D.   On: 01/21/2021 08:35   ECHOCARDIOGRAM COMPLETE  Result Date: 01/21/2021    ECHOCARDIOGRAM REPORT   Patient Name:   Laura Walker Date of Exam: 01/21/2021 Medical Rec #:  [id number]       Height:       64.0 in Accession #:    [id number]      Weight:       222.2 lb Date of Birth:  Jun 15, 1930       BSA:          2.046 m Patient Age:    87 years        BP:           116/80 mmHg Patient Gender: F               HR:           86 bpm. Exam Location:  Inpatient Procedure: 2D Echo, Cardiac Doppler, Color Doppler and Intracardiac            Opacification Agent Indications:    CHF-Acute Diastolic S28.31  History:        Patient has prior history of Echocardiogram examinations, most                 recent 05/31/2020. Risk Factors:Dyslipidemia and Former Smoker.                 CKD.  Sonographer:    Vickie Epley RDCS Referring Phys: [id number] [place]  1. Left ventricular ejection fraction, by estimation, is 20 to 25%. The left ventricle has severely decreased function. The left ventricle demonstrates global hypokinesis. Left ventricular diastolic parameters are indeterminate.  2. Right ventricular systolic function is normal. The right ventricular size is mildly enlarged. There is normal pulmonary artery systolic pressure.  3. Left atrial size was mildly dilated.  4. Right atrial size was mildly dilated.  5. A small pericardial effusion is present. The pericardial effusion is circumferential. Large pleural effusion in the left lateral region.  6. The mitral valve is abnormal. Moderate mitral valve regurgitation.  7. The aortic valve is tricuspid. There is mild calcification of the aortic valve. There is mild thickening of the aortic valve. Aortic valve regurgitation is not visualized. No aortic stenosis is present.  8. The inferior vena cava is normal in size with <50% respiratory variability, suggesting right atrial  pressure of 8 mmHg. FINDINGS  Left Ventricle: Left ventricular ejection fraction, by estimation, is 20 to 25%. The left ventricle has severely decreased function. The left ventricle demonstrates global hypokinesis. Definity contrast agent was given IV to delineate the left ventricular endocardial borders. The left ventricular internal cavity size  was normal in size. There is no left ventricular hypertrophy. Left ventricular diastolic parameters are indeterminate. Right Ventricle: The right ventricular size is mildly enlarged. No increase in right ventricular wall thickness. Right ventricular systolic function is normal. There is normal pulmonary artery systolic pressure. The tricuspid regurgitant velocity is 2.46  m/s, and with an assumed right atrial pressure of 8 mmHg, the estimated right ventricular systolic pressure is 24.2 mmHg. Left Atrium: Left atrial size was mildly dilated. Right Atrium: Right atrial size was mildly dilated. Pericardium: A small pericardial effusion is present. The pericardial effusion is circumferential. Mitral Valve: The mitral valve is abnormal. Moderate mitral valve regurgitation. Tricuspid Valve: The tricuspid valve is normal in structure. Tricuspid valve regurgitation is mild . No evidence of tricuspid stenosis. Aortic Valve: The aortic valve is tricuspid. There is mild calcification of the aortic valve. There is mild thickening of the aortic valve. There is mild aortic valve annular calcification. Aortic valve regurgitation is not visualized. No aortic stenosis  is present. Aortic valve mean gradient measures 2.2 mmHg. Aortic valve peak gradient measures 4.5 mmHg. Aortic valve area, by VTI measures 2.03 cm. Pulmonic Valve: The pulmonic valve was not well visualized. Pulmonic valve regurgitation is not visualized. No evidence of pulmonic stenosis. Aorta: The aortic root is normal in size and structure. Pulmonary Artery: Moderate pulmonary HTN, PASP is 43 mmHg. Venous: The inferior  vena cava is normal in size with less than 50% respiratory variability, suggesting right atrial pressure of 8 mmHg. IAS/Shunts: No atrial level shunt detected by color flow Doppler. Additional Comments: There is a large pleural effusion in the left lateral region.  LEFT VENTRICLE PLAX 2D LVIDd:         5.70 cm LVOT diam:     2.20 cm LV SV:         32 LV SV Index:   16 LVOT Area:     3.80 cm  RIGHT VENTRICLE TAPSE (M-mode): 1.6 cm LEFT ATRIUM             Index       RIGHT ATRIUM           Index LA Vol (A2C):   65.7 ml 32.11 ml/m RA Area:     22.40 cm LA Vol (A4C):   57.7 ml 28.20 ml/m RA Volume:   69.70 ml  34.07 ml/m LA Biplane Vol: 62.1 ml 30.35 ml/m  AORTIC VALVE AV Area (Vmax):    2.24 cm AV Area (Vmean):   2.41 cm AV Area (VTI):     2.03 cm AV Vmax:           105.87 cm/s AV Vmean:          69.151 cm/s AV VTI:            0.159 m AV Peak Grad:      4.5 mmHg AV Mean Grad:      2.2 mmHg LVOT Vmax:         62.26 cm/s LVOT Vmean:        43.837 cm/s LVOT VTI:          0.085 m LVOT/AV VTI ratio: 0.54  AORTA Ao Root diam: 3.60 cm Ao Asc diam:  3.50 cm MR Peak grad: 70.6 mmHg   TRICUSPID VALVE MR Mean grad: 44.0 mmHg   TR Peak grad:   24.2 mmHg MR Vmax:      420.00 cm/s TR Vmax:        246.00 cm/s MR Vmean:  311.0 cm/s                           SHUNTS                           Systemic VTI:  0.09 m                           Systemic Diam: 2.20 cm Carlyle Dolly MD Electronically signed by Carlyle Dolly MD Signature Date/Time: 01/21/2021/2:26:43 PM    Final    Telemetry    01/23/21 AF with rates in the 60-70's - Personally Reviewed  ECG    No new tracing as of 01/23/21 - Personally Reviewed  Cardiac Studies   Echocardiogram 01/21/2021:  1. Left ventricular ejection fraction, by estimation, is 20 to 25%. The  left ventricle has severely decreased function. The left ventricle  demonstrates global hypokinesis. Left ventricular diastolic parameters are  indeterminate.  2. Right ventricular systolic  function is normal. The right ventricular  size is mildly enlarged. There is normal pulmonary artery systolic  pressure.  3. Left atrial size was mildly dilated.  4. Right atrial size was mildly dilated.  5. A small pericardial effusion is present. The pericardial effusion is  circumferential. Large pleural effusion in the left lateral region.  6. The mitral valve is abnormal. Moderate mitral valve regurgitation.  7. The aortic valve is tricuspid. There is mild calcification of the  aortic valve. There is mild thickening of the aortic valve. Aortic valve  regurgitation is not visualized. No aortic stenosis is present.  8. The inferior vena cava is normal in size with <50% respiratory  variability, suggesting right atrial pressure of 8 mmHg.   Patient Profile     87 y.o. female with a hx of lung cancer and atrial fibrilation who is being seen today for the evaluation of new LVD and CHF at the request of Dr Louanne Belton.  Assessment & Plan    1. New onset systolic CHF: -Pt admitted with hypoxia found to have atrial fibrillation with RVR with acute CHF and fluid volume overload on exam.  Echocardiogram performed with new LV dysfunction with an EF at 20 to 25% secondary to possible stress-induced cardiomyopathy versus ischemic etiology.  Due to patient's advanced dementia, plan was to avoid aggressive measures -Continue IV Lasix 40 mg twice daily>>>still appears to be volume overloaded on exam however breathing improved.  -Spironolactone 12.5 daily added to her regimen -Add low dose losartan today  -BP is felt to be soft therefore excluding Entresto>>stable today  -Not felt to be a cardiac catheterization candidate>> discussed with family who agree  2.  Atrial fibrillation with RVR: -Known history of chronic atrial fibrillation, now with reduced LV function -Previously on calcium channel blocker however this was discontinued>> with initiation of IV digoxin and metoprolol -Continue p.o.  Eliquis -Rates very well controlled today   3.  Dementia: -Remains confused but pleasant   4.  History of lung CA: -NSSC of the right upper lobe s/p radiation  5. Elevated LFTs: -Per primary team>>obtaining abdominal imagining.   Signed, Kathyrn Drown NP-C [facility] Pager: [phone] 01/23/2021, 7:09 AM     For questions or updates, please contact   Please consult [email or web address] for contact info under Cardiology/STEMI.

## 2021-01-23 NOTE — Progress Notes (Signed)
  Speech Language Pathology Treatment: Dysphagia  Patient Details Name: Laura Walker MRN: [id number] DOB: 10-13-1930 Today's Date: 01/23/2021 Time: 6720-9470 SLP Time Calculation (min) (ACUTE ONLY): 16 min  Assessment / Plan / Recommendation Clinical Impression  Intermittent throat clearing and a delayed cough x1 was noted with PO trials of thin liquids; however, no other coughing was noted even when challenged with consecutive sips of larger volumes of water. No overt s/s of aspiration were noted with solids and pt continues to have timely mastication with no overt oral signs of dysphagia. Recommend continuation of current diet with precautions taken as needed. SLP will sign off at this time especially given that pt has has no recorded hx of PNA in her chart.    HPI HPI: Laura Walker is a 87 y.o. female with medical history significant of advanced dementia, right upper lobe adenocarcinoma status post radiation 2021, chronic A. fib on Eliquis, CKD stage II, hypertension, OSA on CPAP, presented with altered mentation.  CXR reported "Stable bibasilar opacification left worse than right likely small effusions with atelectasis"      SLP Plan  All goals met       Recommendations  Diet recommendations: Regular;Thin liquid Liquids provided via: Cup;Straw Medication Administration: Whole meds with puree Supervision: Patient able to self feed;Intermittent supervision to cue for compensatory strategies Compensations: Minimize environmental distractions;Slow rate;Small sips/bites Postural Changes and/or Swallow Maneuvers: Seated upright 90 degrees                Oral Care Recommendations: Oral care BID Follow up Recommendations: None SLP Visit Diagnosis: Dysphagia, unspecified (R13.10) Plan: All goals met       GO                Jeanine Luz., SLP Student 01/23/2021, 12:30 PM

## 2021-01-23 NOTE — Plan of Care (Signed)
  Problem: Education: Goal: Knowledge of General Education information will improve Description: Including pain rating scale, medication(s)/side effects and non-pharmacologic comfort measures 01/23/2021 2023 by Robley Fries, RN Outcome: Progressing 01/23/2021 2023 by Robley Fries, RN Outcome: Progressing   Problem: Health Behavior/Discharge Planning: Goal: Ability to manage health-related needs will improve 01/23/2021 2023 by Robley Fries, RN Outcome: Progressing 01/23/2021 2023 by Robley Fries, RN Outcome: Progressing   Problem: Clinical Measurements: Goal: Ability to maintain clinical measurements within normal limits will improve 01/23/2021 2023 by Robley Fries, RN Outcome: Progressing 01/23/2021 2023 by Robley Fries, RN Outcome: Progressing Goal: Will remain free from infection 01/23/2021 2023 by Robley Fries, RN Outcome: Progressing 01/23/2021 2023 by Robley Fries, RN Outcome: Progressing Goal: Diagnostic test results will improve 01/23/2021 2023 by Robley Fries, RN Outcome: Progressing 01/23/2021 2023 by Robley Fries, RN Outcome: Progressing Goal: Respiratory complications will improve 01/23/2021 2023 by Robley Fries, RN Outcome: Progressing 01/23/2021 2023 by Robley Fries, RN Outcome: Progressing Goal: Cardiovascular complication will be avoided 01/23/2021 2023 by Robley Fries, RN Outcome: Progressing 01/23/2021 2023 by Robley Fries, RN Outcome: Progressing   Problem: Activity: Goal: Risk for activity intolerance will decrease 01/23/2021 2023 by Robley Fries, RN Outcome: Progressing 01/23/2021 2023 by Robley Fries, RN Outcome: Progressing   Problem: Nutrition: Goal: Adequate nutrition will be maintained 01/23/2021 2023 by Robley Fries, RN Outcome: Progressing 01/23/2021 2023 by Robley Fries, RN Outcome: Progressing   Problem: Coping: Goal: Level of anxiety will decrease 01/23/2021 2023 by Robley Fries, RN Outcome:  Progressing 01/23/2021 2023 by Robley Fries, RN Outcome: Progressing   Problem: Elimination: Goal: Will not experience complications related to bowel motility 01/23/2021 2023 by Robley Fries, RN Outcome: Progressing 01/23/2021 2023 by Robley Fries, RN Outcome: Progressing Goal: Will not experience complications related to urinary retention 01/23/2021 2023 by Robley Fries, RN Outcome: Progressing 01/23/2021 2023 by Robley Fries, RN Outcome: Progressing   Problem: Pain Managment: Goal: General experience of comfort will improve 01/23/2021 2023 by Robley Fries, RN Outcome: Progressing 01/23/2021 2023 by Robley Fries, RN Outcome: Progressing   Problem: Safety: Goal: Ability to remain free from injury will improve 01/23/2021 2023 by Robley Fries, RN Outcome: Progressing 01/23/2021 2023 by Robley Fries, RN Outcome: Progressing   Problem: Skin Integrity: Goal: Risk for impaired skin integrity will decrease 01/23/2021 2023 by Robley Fries, RN Outcome: Progressing 01/23/2021 2023 by Robley Fries, RN Outcome: Progressing   Problem: Education: Goal: Knowledge of disease or condition will improve Outcome: Progressing Goal: Understanding of medication regimen will improve Outcome: Progressing Goal: Individualized Educational Video(s) Outcome: Progressing   Problem: Activity: Goal: Ability to tolerate increased activity will improve Outcome: Progressing   Problem: Cardiac: Goal: Ability to achieve and maintain adequate cardiopulmonary perfusion will improve Outcome: Progressing   Problem: Health Behavior/Discharge Planning: Goal: Ability to safely manage health-related needs after discharge will improve Outcome: Progressing

## 2021-01-24 DIAGNOSIS — R6520 Severe sepsis without septic shock: Secondary | ICD-10-CM

## 2021-01-24 DIAGNOSIS — A419 Sepsis, unspecified organism: Principal | ICD-10-CM

## 2021-01-24 LAB — BASIC METABOLIC PANEL
Anion gap: 12 (ref 5–15)
BUN: 17 mg/dL (ref 8–23)
CO2: 27 mmol/L (ref 22–32)
Calcium: 9.6 mg/dL (ref 8.9–10.3)
Chloride: 102 mmol/L (ref 98–111)
Creatinine, Ser: 0.74 mg/dL (ref 0.44–1.00)
GFR, Estimated: >60 mL/min (ref >60)
Glucose, Bld: 86 mg/dL (ref 70–99)
Potassium: 3.8 mmol/L (ref 3.5–5.1)
Sodium: 141 mmol/L (ref 135–145)

## 2021-01-24 LAB — CBC
HCT: 40.5 % (ref 36.0–46.0)
Hemoglobin: 12.9 g/dL (ref 12.0–15.0)
MCH: 29.1 pg (ref 26.0–34.0)
MCHC: 31.9 g/dL (ref 30.0–36.0)
MCV: 91.4 fL (ref 80.0–100.0)
Platelets: 255 10*3/uL (ref 150–400)
RBC: 4.43 MIL/uL (ref 3.87–5.11)
RDW: 14.6 % (ref 11.5–15.5)
WBC: 7.2 10*3/uL (ref 4.0–10.5)
nRBC: 0 % (ref 0.0–0.2)

## 2021-01-24 LAB — MAGNESIUM: Magnesium: 1.8 mg/dL (ref 1.7–2.4)

## 2021-01-24 MED ORDER — HEPARIN SODIUM (PORCINE) 5000 UNIT/ML IJ SOLN
5000.0000 [IU] | Freq: Three times a day (TID) | INTRAMUSCULAR | Status: DC
  Administered 2021-01-24 – 2021-01-30 (×17): 5000 [IU] via SUBCUTANEOUS
  Filled 2021-01-24 (×16): qty 1

## 2021-01-24 NOTE — Progress Notes (Signed)
Progress Note  Patient Name: Laura Walker Date of Encounter: 01/24/2021  [facility] Cardiologist: Candee Furbish, MD   Subjective   No acute overnight events. No acute overnight events. Patient could tell me her name but not her date of birth or where she was. She would not answer any other questions for me this morning. However, she looks like she is breathing comfortably and did have any specific complaints this morning.  Inpatient Medications    Scheduled Meds: . apixaban  2.5 mg Oral BID  . buPROPion  75 mg Oral q morning  . digoxin  0.125 mg Oral Daily  . donepezil  10 mg Oral QHS  . DULoxetine  30 mg Oral Daily  . furosemide  40 mg Intravenous Daily  . ipratropium  0.5 mg Nebulization BID  . losartan  25 mg Oral Daily  . memantine  10 mg Oral BID  . metoprolol succinate  25 mg Oral Daily  . oxybutynin  5 mg Oral Q1500  . sodium chloride flush  3 mL Intravenous Q12H  . spironolactone  12.5 mg Oral Daily   Continuous Infusions: . sodium chloride    . cefTRIAXone (ROCEPHIN)  IV Stopped (01/23/21 2332)  . doxycycline (VIBRAMYCIN) IV Stopped (01/24/21 0651)   PRN Meds: sodium chloride, acetaminophen, ipratropium, metoprolol tartrate, ondansetron (ZOFRAN) IV, sodium chloride flush   Vital Signs    Vitals:   01/23/21 1938 01/23/21 2018 01/24/21 0000 01/24/21 0500  BP:  (!) 128/59 126/60 128/68  Pulse:  73  71  Resp:  15 16 16   Temp:  97.9 F (36.6 C) 97.8 F (36.6 C) 97.7 F (36.5 C)  TempSrc:  Oral Oral Axillary  SpO2: 93% 95%    Weight:    97.6 kg  Height:        Intake/Output Summary (Last 24 hours) at 01/24/2021 0754 Last data filed at 01/24/2021 0530 Gross per 24 hour  Intake 874.26 ml  Output 3400 ml  Net -2525.74 ml   Last 3 Weights 01/24/2021 01/23/2021 01/22/2021  Weight (lbs) 215 lb 2.7 oz 220 lb 14.4 oz 223 lb 1.7 oz  Weight (kg) 97.6 kg 100.2 kg 101.2 kg      Telemetry    Atrial fibrillation with rates in the 60's. Occasional PVCs. A few  pauses but all less than 3 seconds. - Personally Reviewed  ECG    No new ECG tracing today. - Personally Reviewed  Physical Exam   GEN: No acute distress.   Neck: Supple. No JVD Cardiac: Irregularly irregular rhythm with normal rate. No murmurs, rubs, or gallops.  Respiratory: No increased work of breath. Decreased breath sounds in bilateral bases with some crackles noted.  GI: Soft, non-distended, and non-tender. MS: 1+ bilateral lower extremity edema (left looks slightly larger than right). No deformity. Neuro:  No focal deficits. Psych: Advanced demential. Would not respond to most questions today.  Labs    High Sensitivity Troponin:   Recent Labs  Lab 01/20/21 1318  TROPONINIHS 52*      Chemistry Recent Labs  Lab 01/21/21 0234 01/22/21 0133 01/23/21 0237 01/24/21 0224  NA 144 141 141 141  K 3.5 3.6 3.5 3.8  CL 104 106 103 102  CO2 28 27 29 27   GLUCOSE 100* 88 89 86  BUN 24* 18 18 17   CREATININE 1.20* 0.88 0.97 0.74  CALCIUM 9.4 9.2 9.3 9.6  PROT 5.6* 5.3* 5.4*  --   ALBUMIN 2.9* 2.6* 2.6*  --  AST 381* 145* 78*  --   ALT 340* 265* 207*  --   ALKPHOS 96 81 78  --   BILITOT 1.1 1.4* 0.8  --   GFRNONAA 44* >60 56* >60  ANIONGAP 12 8 9 12      Hematology Recent Labs  Lab 01/22/21 0133 01/23/21 0237 01/24/21 0224  WBC 7.7 7.9 7.2  RBC 4.19 4.40 4.43  HGB 12.4 12.9 12.9  HCT 38.2 40.5 40.5  MCV 91.2 92.0 91.4  MCH 29.6 29.3 29.1  MCHC 32.5 31.9 31.9  RDW 14.6 14.6 14.6  PLT 210 237 255    BNP Recent Labs  Lab 01/20/21 1705 01/21/21 0234  BNP 1,608.7* 1,367.3*     DDimer No results for input(s): DDIMER in the last 168 hours.   Radiology    No results found.  Cardiac Studies   Echocardiogram 01/21/2021: Impressions: 1. Left ventricular ejection fraction, by estimation, is 20 to 25%. The  left ventricle has severely decreased function. The left ventricle  demonstrates global hypokinesis. Left ventricular diastolic parameters are   indeterminate.  2. Right ventricular systolic function is normal. The right ventricular  size is mildly enlarged. There is normal pulmonary artery systolic  pressure.  3. Left atrial size was mildly dilated.  4. Right atrial size was mildly dilated.  5. A small pericardial effusion is present. The pericardial effusion is  circumferential. Large pleural effusion in the left lateral region.  6. The mitral valve is abnormal. Moderate mitral valve regurgitation.  7. The aortic valve is tricuspid. There is mild calcification of the  aortic valve. There is mild thickening of the aortic valve. Aortic valve  regurgitation is not visualized. No aortic stenosis is present.  8. The inferior vena cava is normal in size with <50% respiratory  variability, suggesting right atrial pressure of 8 mmHg.   Patient Profile     87 y.o. female with a history of atrial fibrillation on Eliquis, LBBB, obstructive sleep apnea, lung cancer, dementia, and anxiety/depression who is being seen for evaluation of newly diagnosed systolic CHF.  Assessment & Plan    New Diagnosed Acute Systolic CHF - Patient admitted with hypoxia and found to have atrial fibrillation with RVR and acute CHF. - BNP elevated at 1,608 >> 1,367. - Chest x-ray showed small right and small-moderate left pleural effusion.  - Echo showed LVEF of 20-25% with global hypokinesis and moderate MR. RV mildly enlarged with normal systolic function and normal PASP. Also showed small pericardial effusion and large left pleural effusion. - Currently on IV Lasix 40mg  daily. Documented urinary output of 3.4 L yesterday and net negative 3.35 L since admission. Weight down 5lbs from yesterday and 9 lbs since admission. Renal function stable. - Continue current dose of Lasix. - Continue Losartan 25mg  daily.  - Continue Toprol-XL 25mg  daily. - Continue Spironolactone 12.5mg  daily. - Continue Digoxin 0.125mg  daily.  - Patient has advanced dementia and  is bedridden and is not felt to benefit from aggressive measures such as Entresto. Right/left cardiac catheterization not recommended and family agrees.  Atrial Fibrillation with RVR - Rates well controlled. A few pauses noted on telemetry but all <3 seconds. - Continue Toprol-XL 25mg  daily and Digoxin 0.125mg  daily. - Continue Eliquis. She is on reduced dose her but looks like she has been on normal dosing at home. She does not meet criteria for reduced dose (age >39 but weight >60 kg and creatinine <1.5). Will discuss with MD.  Transaminitis - Possibly secondary to  congestion from CHF. Improving with diuresis. - No definitive pathology on RUQ ultrasound.  Otherwise, per primary team: - Severe sepsis secondary to pneumonia - Dementia - Left lower extremity wounds: Wound care following  For questions or updates, please contact [facility] Please consult [email or web address] for contact info under        Signed, Darreld Mclean, PA-C  01/24/2021, 7:54 AM

## 2021-01-24 NOTE — Progress Notes (Signed)
PROGRESS NOTE  Laura Walker GLO:[id number] DOB: 02/08/1930 DOA: 01/20/2021 PCP: [name], MD   LOS: 4 days   Brief narrative:  Laura Walker is a 87 y.o. female with medical history significant for dementia, right upper lobe adenocarcinoma status post radiation 2021, chronic A. fib on Eliquis, CKD stage II, hypertension, OSA on CPAP, presented to the hospital with altered mental status and weakness. -In the ED she was noted to be hypoxic with sats of 88%, low-grade fever of 100.7 and A. fib RVR, chest x-ray was suspicious for pneumonia versus fluid overload -She was treated with antibiotics diuretics and IV Cardizem  Assessment/Plan:   Sepsis  poa Community-acquired pneumonia, admitted with low-grade fevers hypoxia and concern for pneumonia along with fluid overload on x-ray -treated with IV ceftriaxone and doxycycline -Blood cultures were negative, afebrile, procalcitonin is low -Covid PCR is negative -Stop antibiotics after today's dose.  New onset systolic CHF -Improved, echocardiogram noted significant drop in EF to 20-25% -Appreciate cardiology input, she is not a candidate for ischemic work-up due to advanced age and dementia -Continue Lasix IV today, she is 3.3 L negative -Continue Toprol 25 mg daily -Also started on losartan and Aldactone -Monitor kidney function closely  A. fib with RVR -Heart rate improving, continue Toprol, digoxin discontinued -Now on Eliquis, per family had significant bleeding from her wounds which continues to be a concern, after further discussion of risks benefits due to advanced age, bedridden status we decided to forego anticoagulation  Advanced dementia Metabolic encephalopathy -Mental status has improved, history of advanced dementia.  Mostly bedbound.  Continue Namenda, duloxetine, donepezil  Acute hypoxic respiratory failure secondary to  Pneumonia/history of heart failure.Marland Kitchen Possibility of pneumonia with acute systolic heart  failure and rapid A. Fib -Initially required BiPAP, now off -Improved and stable on 2 L O2 via nasal cannula now  History of non-small cell lung cancer -Previously treated with radiation   AKI on CKD stage II -Improved and stable  Left lower extremity wounds.  Wound care on board.  Elevated LFT from congestive hepatomegaly.  Improving with diuresis.  DVT prophylaxis: heparin injection 5,000 Units Start: 01/24/21 1400 eliquis  Code Status: DNR  Family Communication: No family at bedside, will attempt to contact daughter  Status is: Inpatient  Remains inpatient appropriate because:IV treatments appropriate due to intensity of illness or inability to take PO and Inpatient level of care appropriate due to severity of illness, on IV diuresis, systolic cardiomyopathy, hypoxic respiratory failure  Dispo: The patient is from: SNF              Anticipated d/c is to: SNF              Patient currently is not medically stable to d/c.   Anticipated date of discharge 2 to 3 days   Difficult to place patient No  Consultants:  Cardiology  Procedures:  BiPAP  Anti-infectives:  Marland Kitchen Rocephin 2/26> . Doxycycline 2/26>  Anti-infectives (From admission, onward)   Start     Dose/Rate Route Frequency Ordered Stop   01/22/21 1430  vancomycin (VANCOREADY) IVPB 1500 mg/300 mL  Status:  Discontinued        1,500 mg 150 mL/hr over 120 Minutes Intravenous Every 48 hours 01/20/21 1457 01/20/21 1840   01/20/21 2300  cefTRIAXone (ROCEPHIN) 2 g in sodium chloride 0.9 % 100 mL IVPB        2 g 200 mL/hr over 30 Minutes Intravenous Every 24 hours 01/20/21 2000  01/20/21 2230  ceFEPIme (MAXIPIME) 2 g in sodium chloride 0.9 % 100 mL IVPB  Status:  Discontinued        2 g 200 mL/hr over 30 Minutes Intravenous Every 12 hours 01/20/21 1500 01/20/21 1840   01/20/21 2000  cefTRIAXone (ROCEPHIN) 1 g in sodium chloride 0.9 % 100 mL IVPB  Status:  Discontinued        1 g 200 mL/hr over 30 Minutes  Intravenous Every 24 hours 01/20/21 1840 01/20/21 2000   01/20/21 1615  doxycycline (VIBRAMYCIN) 100 mg in sodium chloride 0.9 % 250 mL IVPB        100 mg 125 mL/hr over 120 Minutes Intravenous Every 12 hours 01/20/21 1609     01/20/21 1145  ceFEPIme (MAXIPIME) 2 g in sodium chloride 0.9 % 100 mL IVPB        2 g 200 mL/hr over 30 Minutes Intravenous  Once 01/20/21 1134 01/20/21 1244   01/20/21 1145  vancomycin (VANCOREADY) IVPB 2000 mg/400 mL        2,000 mg 200 mL/hr over 120 Minutes Intravenous  Once 01/20/21 1134 01/20/21 1625     Subjective: -States she is breathing better, denies any specific complaints, partly disoriented  Objective: Vitals:   01/24/21 0831 01/24/21 0900  BP:  (!) 130/94  Pulse:  72  Resp:  20  Temp:    SpO2: 100% 98%    Intake/Output Summary (Last 24 hours) at 01/24/2021 1108 Last data filed at 01/24/2021 0824 Gross per 24 hour  Intake 994.26 ml  Output 2650 ml  Net -1655.74 ml   Filed Weights   01/22/21 0454 01/23/21 0610 01/24/21 0500  Weight: 101.2 kg 100.2 kg 97.6 kg   Body mass index is 36.93 kg/m.   Physical Exam: Gen: Obese elderly female sitting up in bed, awake alert oriented to self only, pleasant  HEENT: Positive JVD CVS: S1-S2, irregularly irregular rhythm Lungs: Few basilar rales noted Abdomen: Soft, nontender, nondistended Extremities: 1-2+ bilateral lower extremity edema worse on the left Skin: Warm and dry.  Left lower extremity with wounds  Data Review: I have personally reviewed the following laboratory data and studies,  CBC: Recent Labs  Lab 01/20/21 1122 01/20/21 1633 01/21/21 0234 01/22/21 0133 01/23/21 0237 01/24/21 0224  WBC 9.5  --  8.9 7.7 7.9 7.2  HGB 14.9 12.9 13.2 12.4 12.9 12.9  HCT 48.8* 38.0 40.2 38.2 40.5 40.5  MCV 93.5  --  91.0 91.2 92.0 91.4  PLT 284  --  219 210 237 662   Basic Metabolic Panel: Recent Labs  Lab 01/20/21 1318 01/20/21 1633 01/21/21 0234 01/22/21 0133 01/23/21 0237  01/24/21 0224  NA 142 139 144 141 141 141  K 4.6 4.4 3.5 3.6 3.5 3.8  CL 108  --  104 106 103 102  CO2 22  --  28 27 29 27   GLUCOSE 167*  --  100* 88 89 86  BUN 22  --  24* 18 18 17   CREATININE 1.16*  --  1.20* 0.88 0.97 0.74  CALCIUM 10.1  --  9.4 9.2 9.3 9.6  MG  --   --   --  1.8 1.7 1.8  PHOS  --   --   --  2.8 3.2  --    Liver Function Tests: Recent Labs  Lab 01/20/21 1318 01/21/21 0234 01/22/21 0133 01/23/21 0237  AST 117* 381* 145* 78*  ALT 116* 340* 265* 207*  ALKPHOS 92 96 81 78  BILITOT  1.8* 1.1 1.4* 0.8  PROT 6.6 5.6* 5.3* 5.4*  ALBUMIN 3.4* 2.9* 2.6* 2.6*   No results for input(s): LIPASE, AMYLASE in the last 168 hours. No results for input(s): AMMONIA in the last 168 hours. Cardiac Enzymes: No results for input(s): CKTOTAL, CKMB, CKMBINDEX, TROPONINI in the last 168 hours. BNP (last 3 results) Recent Labs    01/20/21 1705 01/21/21 0234  BNP 1,608.7* 1,367.3*    ProBNP (last 3 results) No results for input(s): PROBNP in the last 8760 hours.  CBG: Recent Labs  Lab 01/20/21 1109  GLUCAP 162*   Recent Results (from the past 240 hour(s))  Blood Culture (routine x 2)     Status: None (Preliminary result)   Collection Time: 01/20/21 11:33 AM   Specimen: BLOOD  Result Value Ref Range Status   Specimen Description BLOOD LEFT ANTECUBITAL  Final   Special Requests   Final    BOTTLES DRAWN AEROBIC AND ANAEROBIC Blood Culture adequate volume   Culture   Final    NO GROWTH 3 DAYS Performed at [facility], [address] [place], [place] [address]    Report Status PENDING  Incomplete  Blood Culture (routine x 2)     Status: None (Preliminary result)   Collection Time: 01/20/21 11:38 AM   Specimen: BLOOD  Result Value Ref Range Status   Specimen Description BLOOD BLOOD RIGHT HAND  Final   Special Requests   Final    AEROBIC BOTTLE ONLY Blood Culture results may not be optimal due to an inadequate volume of blood received in culture bottles    Culture   Final    NO GROWTH 3 DAYS Performed at [facility], [place] [address] [place], [place] [address]    Report Status PENDING  Incomplete  Resp Panel by RT-PCR (Flu A&B, Covid) Nasopharyngeal Swab     Status: None   Collection Time: 01/20/21 12:01 PM   Specimen: Nasopharyngeal Swab; Nasopharyngeal(NP) swabs in vial transport medium  Result Value Ref Range Status   SARS Coronavirus 2 by RT PCR NEGATIVE NEGATIVE Final    Comment: (NOTE) SARS-CoV-2 target nucleic acids are NOT DETECTED.  The SARS-CoV-2 RNA is generally detectable in upper respiratory specimens during the acute phase of infection. The lowest concentration of SARS-CoV-2 viral copies this assay can detect is 138 copies/mL. A negative result does not preclude SARS-Cov-2 infection and should not be used as the sole basis for treatment or other patient management decisions. A negative result may occur with  improper specimen collection/handling, submission of specimen other than nasopharyngeal swab, presence of viral mutation(s) within the areas targeted by this assay, and inadequate number of viral copies(<138 copies/mL). A negative result must be combined with clinical observations, patient history, and epidemiological information. The expected result is Negative.  Fact Sheet for Patients:  EntrepreneurPulse.com.au  Fact Sheet for Healthcare Providers:  IncredibleEmployment.be  This test is no t yet approved or cleared by the [place] FDA and  has been authorized for detection and/or diagnosis of SARS-CoV-2 by FDA under an Emergency Use Authorization (EUA). This EUA will remain  in effect (meaning this test can be used) for the duration of the COVID-19 declaration under Section 564(b)(1) of the Act, 21 U.S.C.section 360bbb-3(b)(1), unless the authorization is terminated  or revoked sooner.       Influenza A by PCR NEGATIVE NEGATIVE Final   Influenza B by PCR  NEGATIVE NEGATIVE Final    Comment: (NOTE) The Xpert Xpress SARS-CoV-2/FLU/RSV plus  assay is intended as an aid in the diagnosis of influenza from Nasopharyngeal swab specimens and should not be used as a sole basis for treatment. Nasal washings and aspirates are unacceptable for Xpert Xpress SARS-CoV-2/FLU/RSV testing.  Fact Sheet for Patients: EntrepreneurPulse.com.au  Fact Sheet for Healthcare Providers: IncredibleEmployment.be  This test is not yet approved or cleared by the [place] FDA and has been authorized for detection and/or diagnosis of SARS-CoV-2 by FDA under an Emergency Use Authorization (EUA). This EUA will remain in effect (meaning this test can be used) for the duration of the COVID-19 declaration under Section 564(b)(1) of the Act, 21 U.S.C. section 360bbb-3(b)(1), unless the authorization is terminated or revoked.  Performed at [facility], [place] [address], [place], [place] [address]      Studies: No results found.    Domenic Polite, MD  [facility] 01/24/2021

## 2021-01-24 NOTE — Progress Notes (Signed)
Pt placed on cpap 

## 2021-01-25 DIAGNOSIS — R6520 Severe sepsis without septic shock: Secondary | ICD-10-CM | POA: Diagnosis not present

## 2021-01-25 DIAGNOSIS — A419 Sepsis, unspecified organism: Secondary | ICD-10-CM | POA: Diagnosis not present

## 2021-01-25 LAB — COMPREHENSIVE METABOLIC PANEL
ALT: 135 U/L — ABNORMAL HIGH (ref 0–44)
AST: 54 U/L — ABNORMAL HIGH (ref 15–41)
Albumin: 2.7 g/dL — ABNORMAL LOW (ref 3.5–5.0)
Alkaline Phosphatase: 78 U/L (ref 38–126)
Anion gap: 11 (ref 5–15)
BUN: 16 mg/dL (ref 8–23)
CO2: 29 mmol/L (ref 22–32)
Calcium: 9.5 mg/dL (ref 8.9–10.3)
Chloride: 98 mmol/L (ref 98–111)
Creatinine, Ser: 0.77 mg/dL (ref 0.44–1.00)
GFR, Estimated: >60 mL/min (ref >60)
Glucose, Bld: 97 mg/dL (ref 70–99)
Potassium: 3.8 mmol/L (ref 3.5–5.1)
Sodium: 138 mmol/L (ref 135–145)
Total Bilirubin: 0.8 mg/dL (ref 0.3–1.2)
Total Protein: 5.5 g/dL — ABNORMAL LOW (ref 6.5–8.1)

## 2021-01-25 LAB — CULTURE, BLOOD (ROUTINE X 2)
Culture: NO GROWTH
Culture: NO GROWTH
Special Requests: ADEQUATE

## 2021-01-25 MED ORDER — METOPROLOL SUCCINATE ER 25 MG PO TB24
12.5000 mg | ORAL_TABLET | Freq: Every day | ORAL | Status: DC
  Administered 2021-01-25 – 2021-01-30 (×6): 12.5 mg via ORAL
  Filled 2021-01-25 (×5): qty 1

## 2021-01-25 MED ORDER — FUROSEMIDE 20 MG PO TABS
20.0000 mg | ORAL_TABLET | Freq: Every day | ORAL | Status: DC
  Administered 2021-01-25 – 2021-01-30 (×6): 20 mg via ORAL
  Filled 2021-01-25 (×6): qty 1

## 2021-01-25 NOTE — Care Management Important Message (Signed)
Important Message  Patient Details  Name: Laura Walker MRN: [id number] Date of Birth: [birth year]   Medicare Important Message Given:  Yes     Shelda Altes 01/25/2021, 9:37 AM

## 2021-01-25 NOTE — Evaluation (Signed)
Physical Therapy Evaluation Patient Details Name: Laura Walker MRN: [id number] DOB: 06-Jul-1930 Today's Date: 01/25/2021   History of Present Illness  87 y.o. female , presented to the hospital on 01/20/2021 with altered mental status and weakness, chest x-ray suspicious for PNA in ED. PMH: dementia, right upper lobe adenocarcinoma status post radiation 2021, chronic A. fib on Eliquis, CKD stage II, hypertension, OSA on CPAP.  Clinical Impression  Pt presents to PT with deficits in strength, power, functional mobility. At baseline pt performs stand pivot transfers to wheelchair with staff assistance. Pt currently requires significant physical assistance to stand and is unable to bring trunk into fully erect stance. Pt R foot begins to slide with standing attempts, increasing falls risk due to instability. PT recommends use of hoyer lift for transfers at this time with continued acute PT POC to improve functional mobility quality. PT recommends SNF placement at this time.    Follow Up Recommendations SNF (ideally short term rehab at [facility], unless memory care at [facility] can provide maxA for transfers or utilize lift for transfers)    Equipment Recommendations  None recommended by PT ([facility] has all necessary equipment)    Recommendations for Other Services       Precautions / Restrictions Precautions Precautions: Fall Restrictions Weight Bearing Restrictions: No      Mobility  Bed Mobility Overal bed mobility: Needs Assistance Bed Mobility: Supine to Sit;Sit to Supine     Supine to sit: Max assist;HOB elevated Sit to supine: Max assist;HOB elevated        Transfers Overall transfer level: Needs assistance Equipment used: 1 person hand held assist Transfers: Sit to/from Stand Sit to Stand: Max assist         General transfer comment: pt performs 3 sit to stands with maxA, knee block and BUE support. Deferred OOB transfer due to falls risk  Ambulation/Gait                 Stairs            Wheelchair Mobility    Modified Rankin (Stroke Patients Only)       Balance Overall balance assessment: Needs assistance Sitting-balance support: Single extremity supported;Feet supported Sitting balance-Leahy Scale: Poor Sitting balance - Comments: minA due to left lean Postural control: Left lateral lean Standing balance support: Bilateral upper extremity supported Standing balance-Leahy Scale: Poor Standing balance comment: maxA with BUE support                             Pertinent Vitals/Pain Pain Assessment: No/denies pain    Home Living Family/patient expects to be discharged to:: Skilled nursing facility                 Additional Comments: pt lives at [facility] in memory care    Prior Function Level of Independence: Needs assistance   Gait / Transfers Assistance Needed: pt requires assistance for stand pivot transfers to wheelchair, pt is dependent for wheelchair mobility. Pt transfers many times a day with staff assistance.  ADL's / Homemaking Assistance Needed: pt requires assistance for all ADLs        Hand Dominance        Extremity/Trunk Assessment   Upper Extremity Assessment Upper Extremity Assessment: Generalized weakness    Lower Extremity Assessment Lower Extremity Assessment: Generalized weakness    Cervical / Trunk Assessment Cervical / Trunk Assessment: Kyphotic  Communication   Communication: HOH (L hearing aide)  Cognition Arousal/Alertness: Awake/alert Behavior During Therapy: Flat affect Overall Cognitive Status: History of cognitive impairments - at baseline                                 General Comments: pt with dementia, demonstrates slowed processing but does follow one step commands consistently, reduced awareness of deficits.      General Comments General comments (skin integrity, edema, etc.): VSS on RA    Exercises     Assessment/Plan     PT Assessment Patient needs continued PT services  PT Problem List Decreased strength;Decreased activity tolerance;Decreased balance;Decreased mobility;Decreased knowledge of use of DME;Decreased safety awareness;Decreased knowledge of precautions;Decreased cognition       PT Treatment Interventions DME instruction;Functional mobility training;Therapeutic activities;Therapeutic exercise;Balance training;Neuromuscular re-education;Patient/family education    PT Goals (Current goals can be found in the Care Plan section)  Acute Rehab PT Goals Patient Stated Goal: to return to baseline, stand pivot transfers with asssitance PT Goal Formulation: With patient/family Time For Goal Achievement: 02/08/21 Potential to Achieve Goals: Fair    Frequency Min 2X/week   Barriers to discharge        Co-evaluation               AM-PAC PT "6 Clicks" Mobility  Outcome Measure Help needed turning from your back to your side while in a flat bed without using bedrails?: A Lot Help needed moving from lying on your back to sitting on the side of a flat bed without using bedrails?: A Lot Help needed moving to and from a bed to a chair (including a wheelchair)?: Total Help needed standing up from a chair using your arms (e.g., wheelchair or bedside chair)?: A Lot Help needed to walk in hospital room?: Total Help needed climbing 3-5 steps with a railing? : Total 6 Click Score: 9    End of Session   Activity Tolerance: Patient tolerated treatment well Patient left: in bed;with call bell/phone within reach;with bed alarm set;with family/visitor present Nurse Communication: Mobility status;Need for lift equipment PT Visit Diagnosis: Other abnormalities of gait and mobility (R26.89);Muscle weakness (generalized) (M62.81)    Time: 8182-9937 PT Time Calculation (min) (ACUTE ONLY): 30 min   Charges:   PT Evaluation $PT Eval Moderate Complexity: 1 Mod PT Treatments $Therapeutic Activity: 8-22  mins        Zenaida Niece, PT, DPT Acute Rehabilitation Pager: [phone]   Zenaida Niece 01/25/2021, 5:56 PM

## 2021-01-25 NOTE — Progress Notes (Signed)
PROGRESS NOTE  Laura Walker DXI:[id number] DOB: [birth year] DOA: 01/20/2021 PCP: [name], MD   LOS: 5 days   Brief narrative:  Laura Walker is a 87 y.o. female with medical history significant for dementia, right upper lobe adenocarcinoma status post radiation 2021, chronic A. fib on Eliquis, CKD stage II, hypertension, OSA on CPAP, presented to the hospital with altered mental status and weakness. -In the ED she was noted to be hypoxic with sats of 88%, low-grade fever of 100.7 and A. fib RVR, chest x-ray was suspicious for pneumonia versus fluid overload -She was treated with antibiotics diuretics and IV Cardizem  Assessment/Plan:   Sepsis  poa Community-acquired pneumonia, admitted with low-grade fevers hypoxia and concern for pneumonia along with fluid overload on x-ray -treated with IV ceftriaxone and doxycycline -Blood cultures were negative, afebrile, procalcitonin was low -Covid PCR is negative -Completed antibiotics  New onset systolic CHF -echocardiogram noted significant drop in EF to 20-25% -Appreciate cardiology input, she is not a candidate for ischemic work-up due to advanced age and dementia -Diuresed with IV Lasix, she is 3.7 L negative -Continue Toprol 25 mg daily, also started on Aldactone and losartan at low doses, fortunately kidney function remains normal -Transition to Lasix p.o. today  A. fib with RVR -Heart rate improving, continue Toprol, digoxin discontinued -Started on Eliquis, however had history of considerable bleeding from her leg wounds,  after further discussion of risks benefits due to advanced age, bedridden status we decided to forego anticoagulation  Advanced dementia Metabolic encephalopathy -Mental status has improved, history of advanced dementia.  Mostly bedbound.  Continue Namenda, duloxetine, donepezil -Required significant amount of assistance at ALF -I suspect she may need a higher level of care, will ask PT OT to  evaluate  Acute hypoxic respiratory failure secondary to  Pneumonia/history of heart failure.Marland Kitchen Possibility of pneumonia with acute systolic heart failure and rapid A. Fib -Initially required BiPAP, now off -Now stable on BiPAP nightly only -Improved and stable on 2 L O2 via nasal cannula now  History of non-small cell lung cancer -Previously treated with radiation   AKI on CKD stage II -Improved and stable  Left lower extremity wounds.  Routine wound care  Elevated LFT from congestive hepatomegaly.  Improving with diuresis.  DVT prophylaxis: heparin injection 5,000 Units Start: 01/24/21 1400 eliquis  Code Status: DNR  Family Communication: No family at bedside, updated daughter Juliann Pulse  Status is: Inpatient  Remains inpatient appropriate because:IV treatments appropriate due to intensity of illness or inability to take PO and Inpatient level of care appropriate due to severity of illness, on IV diuresis, systolic cardiomyopathy, hypoxic respiratory failure  Dispo: The patient is from: Memory care unit              Anticipated d/c is to: SNF versus back to [facility]              Patient currently is not medically stable to d/c.   Anticipated date of discharge 1 to 2 days   Difficult to place patient No  Consultants:  Cardiology  Procedures:  BiPAP  Anti-infectives:  Marland Kitchen Rocephin 2/26> . Doxycycline 2/26>  Anti-infectives (From admission, onward)   Start     Dose/Rate Route Frequency Ordered Stop   01/22/21 1430  vancomycin (VANCOREADY) IVPB 1500 mg/300 mL  Status:  Discontinued        1,500 mg 150 mL/hr over 120 Minutes Intravenous Every 48 hours 01/20/21 1457 01/20/21 1840   01/20/21 2300  cefTRIAXone (  ROCEPHIN) 2 g in sodium chloride 0.9 % 100 mL IVPB        2 g 200 mL/hr over 30 Minutes Intravenous Every 24 hours 01/20/21 2000     01/20/21 2230  ceFEPIme (MAXIPIME) 2 g in sodium chloride 0.9 % 100 mL IVPB  Status:  Discontinued        2 g 200 mL/hr over 30  Minutes Intravenous Every 12 hours 01/20/21 1500 01/20/21 1840   01/20/21 2000  cefTRIAXone (ROCEPHIN) 1 g in sodium chloride 0.9 % 100 mL IVPB  Status:  Discontinued        1 g 200 mL/hr over 30 Minutes Intravenous Every 24 hours 01/20/21 1840 01/20/21 2000   01/20/21 1615  doxycycline (VIBRAMYCIN) 100 mg in sodium chloride 0.9 % 250 mL IVPB  Status:  Discontinued        100 mg 125 mL/hr over 120 Minutes Intravenous Every 12 hours 01/20/21 1609 01/24/21 1110   01/20/21 1145  ceFEPIme (MAXIPIME) 2 g in sodium chloride 0.9 % 100 mL IVPB        2 g 200 mL/hr over 30 Minutes Intravenous  Once 01/20/21 1134 01/20/21 1244   01/20/21 1145  vancomycin (VANCOREADY) IVPB 2000 mg/400 mL        2,000 mg 200 mL/hr over 120 Minutes Intravenous  Once 01/20/21 1134 01/20/21 1625     Subjective: -Sleeping most of the time, no specific complaints, used her BiPAP last night  Objective: Vitals:   01/24/21 2353 01/25/21 0638  BP: 105/73 (!) 121/56  Pulse: 64 64  Resp: 20 (!) 22  Temp: 98 F (36.7 C) 97.8 F (36.6 C)  SpO2: 95% 97%    Intake/Output Summary (Last 24 hours) at 01/25/2021 1258 Last data filed at 01/24/2021 1500 Gross per 24 hour  Intake -  Output 650 ml  Net -650 ml   Filed Weights   01/23/21 0610 01/24/21 0500 01/25/21 0638  Weight: 100.2 kg 97.6 kg 97.6 kg   Body mass index is 36.93 kg/m.   Physical Exam: Gen: Obese elderly female laying in bed, somnolent but easily arousable, answers a few questions, oriented to self only HEENT: Neck obese unable to assess JVD CVS: S1-S2, irregularly irregular rhythm Lungs: Few basilar rales noted Abdomen: Soft, nontender, nondistended, bowel sounds present Remedies: 1+ edema , worse on the left Skin: Warm and dry.  Left lower extremity with wounds  Data Review: I have personally reviewed the following laboratory data and studies,  CBC: Recent Labs  Lab 01/20/21 1122 01/20/21 1633 01/21/21 0234 01/22/21 0133 01/23/21 0237  01/24/21 0224  WBC 9.5  --  8.9 7.7 7.9 7.2  HGB 14.9 12.9 13.2 12.4 12.9 12.9  HCT 48.8* 38.0 40.2 38.2 40.5 40.5  MCV 93.5  --  91.0 91.2 92.0 91.4  PLT 284  --  219 210 237 354   Basic Metabolic Panel: Recent Labs  Lab 01/21/21 0234 01/22/21 0133 01/23/21 0237 01/24/21 0224 01/25/21 0807  NA 144 141 141 141 138  K 3.5 3.6 3.5 3.8 3.8  CL 104 106 103 102 98  CO2 28 27 29 27 29   GLUCOSE 100* 88 89 86 97  BUN 24* 18 18 17 16   CREATININE 1.20* 0.88 0.97 0.74 0.77  CALCIUM 9.4 9.2 9.3 9.6 9.5  MG  --  1.8 1.7 1.8  --   PHOS  --  2.8 3.2  --   --    Liver Function Tests: Recent Labs  Lab 01/20/21  1318 01/21/21 0234 01/22/21 0133 01/23/21 0237 01/25/21 0807  AST 117* 381* 145* 78* 54*  ALT 116* 340* 265* 207* 135*  ALKPHOS 92 96 81 78 78  BILITOT 1.8* 1.1 1.4* 0.8 0.8  PROT 6.6 5.6* 5.3* 5.4* 5.5*  ALBUMIN 3.4* 2.9* 2.6* 2.6* 2.7*   No results for input(s): LIPASE, AMYLASE in the last 168 hours. No results for input(s): AMMONIA in the last 168 hours. Cardiac Enzymes: No results for input(s): CKTOTAL, CKMB, CKMBINDEX, TROPONINI in the last 168 hours. BNP (last 3 results) Recent Labs    01/20/21 1705 01/21/21 0234  BNP 1,608.7* 1,367.3*    ProBNP (last 3 results) No results for input(s): PROBNP in the last 8760 hours.  CBG: Recent Labs  Lab 01/20/21 1109  GLUCAP 162*   Recent Results (from the past 240 hour(s))  Blood Culture (routine x 2)     Status: None (Preliminary result)   Collection Time: 01/20/21 11:33 AM   Specimen: BLOOD  Result Value Ref Range Status   Specimen Description BLOOD LEFT ANTECUBITAL  Final   Special Requests   Final    BOTTLES DRAWN AEROBIC AND ANAEROBIC Blood Culture adequate volume   Culture   Final    NO GROWTH 4 DAYS Performed at [facility], [place] [address] [place], [place] [address]    Report Status PENDING  Incomplete  Blood Culture (routine x 2)     Status: None (Preliminary result)   Collection Time:  01/20/21 11:38 AM   Specimen: BLOOD  Result Value Ref Range Status   Specimen Description BLOOD BLOOD RIGHT HAND  Final   Special Requests   Final    AEROBIC BOTTLE ONLY Blood Culture results may not be optimal due to an inadequate volume of blood received in culture bottles   Culture   Final    NO GROWTH 4 DAYS Performed at [facility], [place] [address], [place], [place] [address]    Report Status PENDING  Incomplete  Resp Panel by RT-PCR (Flu A&B, Covid) Nasopharyngeal Swab     Status: None   Collection Time: 01/20/21 12:01 PM   Specimen: Nasopharyngeal Swab; Nasopharyngeal(NP) swabs in vial transport medium  Result Value Ref Range Status   SARS Coronavirus 2 by RT PCR NEGATIVE NEGATIVE Final    Comment: (NOTE) SARS-CoV-2 target nucleic acids are NOT DETECTED.  The SARS-CoV-2 RNA is generally detectable in upper respiratory specimens during the acute phase of infection. The lowest concentration of SARS-CoV-2 viral copies this assay can detect is 138 copies/mL. A negative result does not preclude SARS-Cov-2 infection and should not be used as the sole basis for treatment or other patient management decisions. A negative result may occur with  improper specimen collection/handling, submission of specimen other than nasopharyngeal swab, presence of viral mutation(s) within the areas targeted by this assay, and inadequate number of viral copies(<138 copies/mL). A negative result must be combined with clinical observations, patient history, and epidemiological information. The expected result is Negative.  Fact Sheet for Patients:  EntrepreneurPulse.com.au  Fact Sheet for Healthcare Providers:  IncredibleEmployment.be  This test is no t yet approved or cleared by the [place] FDA and  has been authorized for detection and/or diagnosis of SARS-CoV-2 by FDA under an Emergency Use Authorization (EUA). This EUA will remain  in effect  (meaning this test can be used) for the duration of the COVID-19 declaration under Section 564(b)(1) of the Act, 21 U.S.C.section 360bbb-3(b)(1), unless the authorization is terminated  or revoked sooner.       Influenza A by PCR NEGATIVE NEGATIVE Final   Influenza B by PCR NEGATIVE NEGATIVE Final    Comment: (NOTE) The Xpert Xpress SARS-CoV-2/FLU/RSV plus assay is intended as an aid in the diagnosis of influenza from Nasopharyngeal swab specimens and should not be used as a sole basis for treatment. Nasal washings and aspirates are unacceptable for Xpert Xpress SARS-CoV-2/FLU/RSV testing.  Fact Sheet for Patients: EntrepreneurPulse.com.au  Fact Sheet for Healthcare Providers: IncredibleEmployment.be  This test is not yet approved or cleared by the [place] FDA and has been authorized for detection and/or diagnosis of SARS-CoV-2 by FDA under an Emergency Use Authorization (EUA). This EUA will remain in effect (meaning this test can be used) for the duration of the COVID-19 declaration under Section 564(b)(1) of the Act, 21 U.S.C. section 360bbb-3(b)(1), unless the authorization is terminated or revoked.  Performed at [facility], [place] [address], [place], [place] [address]      Studies: No results found.   Domenic Polite, MD  [facility] 01/25/2021

## 2021-01-25 NOTE — Progress Notes (Addendum)
Progress Note  Patient Name: Laura Walker Date of Encounter: 01/25/2021  [facility] Cardiologist: Candee Furbish, MD   Subjective   No acute overnight events. Patient sleeping this morning with CPAP on. However, she has no complaints this morning. Denies chest pain or shortness of breath.  Inpatient Medications    Scheduled Meds: . buPROPion  75 mg Oral q morning  . donepezil  10 mg Oral QHS  . DULoxetine  30 mg Oral Daily  . furosemide  40 mg Intravenous Daily  . heparin  5,000 Units Subcutaneous Q8H  . ipratropium  0.5 mg Nebulization BID  . losartan  25 mg Oral Daily  . memantine  10 mg Oral BID  . metoprolol succinate  25 mg Oral Daily  . oxybutynin  5 mg Oral Q1500  . sodium chloride flush  3 mL Intravenous Q12H  . spironolactone  12.5 mg Oral Daily   Continuous Infusions: . sodium chloride    . cefTRIAXone (ROCEPHIN)  IV 2 g (01/24/21 2201)   PRN Meds: sodium chloride, acetaminophen, ipratropium, metoprolol tartrate, ondansetron (ZOFRAN) IV, sodium chloride flush   Vital Signs    Vitals:   01/24/21 1500 01/24/21 2002 01/24/21 2353 01/25/21 0638  BP: 108/66 (!) 111/94 105/73 (!) 121/56  Pulse: 67 99 64 64  Resp: 15 18 20  (!) 22  Temp:  97.7 F (36.5 C) 98 F (36.7 C) 97.8 F (36.6 C)  TempSrc:  Oral Axillary Axillary  SpO2: 97% 99% 95% 97%  Weight:    97.6 kg  Height:        Intake/Output Summary (Last 24 hours) at 01/25/2021 0748 Last data filed at 01/24/2021 1500 Gross per 24 hour  Intake 240 ml  Output 650 ml  Net -410 ml   Last 3 Weights 01/25/2021 01/24/2021 01/23/2021  Weight (lbs) 215 lb 2.7 oz 215 lb 2.7 oz 220 lb 14.4 oz  Weight (kg) 97.6 kg 97.6 kg 100.2 kg      Telemetry    Atrial fibrillation with rates in low 50's to 60's (high 40's at times).  - Personally Reviewed  ECG    No new ECG tracing today. - Personally Reviewed  Physical Exam   GEN: No acute distress.   Neck: Supple. No JVD Cardiac: Irregularly irregular rhythm with  normal rate. No murmurs, rubs, or gallops.  Respiratory: No increased work of breathing. Clear to auscultation anteriorly. GI: Soft, non-distended, and non-tender. MS: No lower extremity edema. No deformity. Skin: Warm and dry. Neuro: No focal deficits. Significant dementia.   Psych: Significant dementia but will answer questions.  Labs    High Sensitivity Troponin:   Recent Labs  Lab 01/20/21 1318  TROPONINIHS 52*      Chemistry Recent Labs  Lab 01/21/21 0234 01/22/21 0133 01/23/21 0237 01/24/21 0224  NA 144 141 141 141  K 3.5 3.6 3.5 3.8  CL 104 106 103 102  CO2 28 27 29 27   GLUCOSE 100* 88 89 86  BUN 24* 18 18 17   CREATININE 1.20* 0.88 0.97 0.74  CALCIUM 9.4 9.2 9.3 9.6  PROT 5.6* 5.3* 5.4*  --   ALBUMIN 2.9* 2.6* 2.6*  --   AST 381* 145* 78*  --   ALT 340* 265* 207*  --   ALKPHOS 96 81 78  --   BILITOT 1.1 1.4* 0.8  --   GFRNONAA 44* >60 56* >60  ANIONGAP 12 8 9 12      Hematology Recent Labs  Lab 01/22/21  0133 01/23/21 0237 01/24/21 0224  WBC 7.7 7.9 7.2  RBC 4.19 4.40 4.43  HGB 12.4 12.9 12.9  HCT 38.2 40.5 40.5  MCV 91.2 92.0 91.4  MCH 29.6 29.3 29.1  MCHC 32.5 31.9 31.9  RDW 14.6 14.6 14.6  PLT 210 237 255    BNP Recent Labs  Lab 01/20/21 1705 01/21/21 0234  BNP 1,608.7* 1,367.3*     DDimer No results for input(s): DDIMER in the last 168 hours.   Radiology    No results found.  Cardiac Studies   Echocardiogram 01/21/2021: Impressions: 1. Left ventricular ejection fraction, by estimation, is 20 to 25%. The  left ventricle has severely decreased function. The left ventricle  demonstrates global hypokinesis. Left ventricular diastolic parameters are  indeterminate.  2. Right ventricular systolic function is normal. The right ventricular  size is mildly enlarged. There is normal pulmonary artery systolic  pressure.  3. Left atrial size was mildly dilated.  4. Right atrial size was mildly dilated.  5. A small pericardial  effusion is present. The pericardial effusion is  circumferential. Large pleural effusion in the left lateral region.  6. The mitral valve is abnormal. Moderate mitral valve regurgitation.  7. The aortic valve is tricuspid. There is mild calcification of the  aortic valve. There is mild thickening of the aortic valve. Aortic valve  regurgitation is not visualized. No aortic stenosis is present.  8. The inferior vena cava is normal in size with <50% respiratory  variability, suggesting right atrial pressure of 8 mmHg.   Patient Profile     87 y.o. female with a history of atrial fibrillation on Eliquis, LBBB, obstructive sleep apnea, lung cancer (lack of response to radiation therapy), dementia, and anxiety/depression. She was admitted on 01/20/2021 with altered mental status, hypoxic respiratory failure, and atrial fibrillation with RVR. She was subsequently found to have new systolic CHF for which Cardiology was consulted.   Assessment & Plan    New Diagnosed Acute Systolic CHF - Patient admitted with hypoxia and found to have atrial fibrillation with RVR and acute CHF. - BNP elevated at 1,608 >> 1,367. - Chest x-ray showed small right and small-moderate left pleural effusion.  - Echo showed LVEF of 20-25% with global hypokinesis and moderate MR. RV mildly enlarged with normal systolic function and normal PASP. Also showed small pericardial effusion and large left pleural effusion. - Currently on IV Lasix 40mg  daily. Only 650 mL of documented urinary output yesterday but suspect this is not accurate. Net negative 3.7 L this admission. Weight down 9 lbs since admission. Today's BMET pending. - Will transition to PO Lasix 20mg  daily. - Continue Losartan 25mg  daily.  - Will decrease Toprol-XL to 12.5mg  daily given bradycardia. - Continue Spironolactone 12.5mg  daily. - Digoxin was stopped yesterday. - Patient has advanced dementia and is bedridden and is not felt to benefit from aggressive  measures such as Entresto. Right/left cardiac catheterization not recommended and family agrees.  Atrial Fibrillation with RVR - Rates in the low 50's to 60's.  - Will decrease Toprol-XL to 12.5mg  daily. - Continue Toprol-XL 25mg  daily. - Digoxin was stopped yesterday.  - Patient was on Eliquis at home; however, daughter had concerns about bleeding from wounds. Given bleeding, advanced dementia, and the fact that patient is bedridden, decision was made to stop anticoagulation as this was not felt to be in her best interest.  Transaminitis - Possibly secondary to congestion from CHF. Improving with diuresis. - No definitive pathology on RUQ ultrasound.  Otherwise, per primary team: - Severe sepsis secondary to pneumonia - Dementia - Left lower extremity wounds: Wound care following  For questions or updates, please contact [facility] Please consult [email or web address] for contact info under        Signed, Darreld Mclean, PA-C  01/25/2021, 7:48 AM

## 2021-01-26 LAB — RESP PANEL BY RT-PCR (FLU A&B, COVID) ARPGX2
Influenza A by PCR: NEGATIVE
Influenza B by PCR: NEGATIVE
SARS Coronavirus 2 by RT PCR: NEGATIVE

## 2021-01-26 NOTE — Progress Notes (Signed)
RE: Laura Walker. [name]   Date of Birth: 11-30-1929  Date: 01/26/2021  To Whom It May Concern:  Please be advised that the above-named patient has a primary diagnosis of dementia which supersedes any psychiatric diagnosis.

## 2021-01-26 NOTE — Progress Notes (Signed)
RE: Laura Walker. [name]  Date of Birth: 05-14-1930  Date: 01/26/2021  To Whom It May Concern:  Please be advised that the above-named patient will require a short-term nursing home stay - anticipated 30 days or less for rehabilitation and strengthening. The plan is for return home.

## 2021-01-26 NOTE — NC FL2 (Addendum)
[place] LEVEL OF CARE SCREENING TOOL     IDENTIFICATION  Patient Name: Laura Walker Birthdate: [birth year] Sex: female Admission Date (Current Location): 01/20/2021  [facility] [place] and [place] Number:  Herbalist and Address:  [facility] [place], [place] [address], [place], [place] [address]      Provider Number: [id number]  Attending Physician Name and Address:  Domenic Polite, MD  Relative Name and Phone Number:  Luciana Axe [phone]    Current Level of Care: Hospital Recommended Level of Care: ALF Memory Care Prior Approval Number:    Date Approved/Denied:   PASRR Number:  Discharge Plan: ALF Memory Care    Current Diagnoses: Patient Active Problem List   Diagnosis Date Noted  . Acute systolic CHF (congestive heart failure) ([place]) 01/23/2021  . CKD (chronic kidney disease), stage II 01/23/2021  . Severe sepsis ([place]) 01/20/2021  . A-fib ([place]) 01/20/2021  . Acute febrile illness   . Adenocarcinoma of right lung, stage 1 ([place]) 02/07/2020  . Left bundle branch block 02/02/2020  . Persistent atrial fibrillation ([place]) 02/02/2020  . Lung mass 01/06/2020  . Mild dementia ([place]) 08/24/2018    Orientation RESPIRATION BLADDER Height & Weight     Self  O2 (2 liters Nasal Cannula) Incontinent,External catheter Weight: 217 lb 2.5 oz (98.5 kg) Height:  5\' 4"  (162.6 cm)  BEHAVIORAL SYMPTOMS/MOOD NEUROLOGICAL BOWEL NUTRITION STATUS      Incontinent Regular   AMBULATORY STATUS COMMUNICATION OF NEEDS Skin   Extensive Assist Verbally Other (Comment) (Open wound/incision non pressure, left leg  01/20/21)                       Personal Care Assistance Level of Assistance  Bathing,Feeding,Dressing Bathing Assistance: Maximum assistance Feeding assistance: Limited assistance Dressing Assistance: Maximum assistance     Functional Limitations Info  Sight,Hearing,Speech Sight Info: Impaired Hearing Info: Impaired Speech Info:  Adequate    SPECIAL CARE FACTORS FREQUENCY  PT (By licensed PT),OT (By licensed OT)     PT Frequency: 5x min weekly OT Frequency: 5x min weekly            Contractures Contractures Info: Not present    Additional Factors Info  Code Status,Allergies,Psychotropic Code Status Info: DNR Allergies Info: Tramadol Psychotropic Info: buPROPion (WELLBUTRIN) tablet 75 mg every morning, DULoxetine (CYMBALTA) DR capsule 30 mg daily,               TAKE these medications       buPROPion 75 MG tablet Commonly known as: WELLBUTRIN Take 75 mg by mouth every morning.   ciclopirox 8 % solution Commonly known as: PENLAC Apply 1 drop topically daily. On toenails   donepezil 10 MG tablet Commonly known as: ARICEPT TAKE 1 TABLET BY MOUTH AT BEDTIME   DULoxetine 30 MG capsule Commonly known as: CYMBALTA Take 30 mg by mouth daily.   Fish Oil Ultra 1400 MG Caps Take 1,400 mg by mouth daily.   furosemide 20 MG tablet Commonly known as: LASIX Take 1 tablet (20 mg total) by mouth daily.   losartan 25 MG tablet Commonly known as: COZAAR Take 1 tablet (25 mg total) by mouth daily.   meloxicam 7.5 MG tablet Commonly known as: MOBIC Take 15 mg by mouth as needed (knee pain).   memantine 10 MG tablet Commonly known as: NAMENDA Take 1 tablet by mouth twice daily   metoprolol succinate 25 MG 24 hr tablet Commonly known as: TOPROL-XL  Take 0.5 tablets (12.5 mg total) by mouth daily.   nystatin powder Commonly known as: MYCOSTATIN/NYSTOP Apply 1 application topically 2 (two) times daily.   oxybutynin 5 MG 24 hr tablet Commonly known as: DITROPAN-XL Take 5 mg by mouth daily in the afternoon.   Red Yeast Rice 600 MG Caps Take 600 mg by mouth daily in the afternoon.   spironolactone 25 MG tablet Commonly known as: ALDACTONE Take 0.5 tablets (12.5 mg total) by mouth daily.   vitamin B-12 1000 MCG tablet Commonly known as: CYANOCOBALAMIN Take 1,000 mcg by mouth  daily.   Vitamin D 50 MCG (2000 UT) tablet Take 2,000 Units by mouth daily.                                        Relevant Imaging Results:  Relevant Lab Results:   Additional Information SSN [id number]  Trula Ore, LCSWA

## 2021-01-26 NOTE — Progress Notes (Addendum)
PROGRESS NOTE  Laura Walker DOB: 12-22-1929 DOA: 01/20/2021 PCP: [name], MD   LOS: 6 days   Brief narrative:  Laura Walker is a 87 y.o. female with medical history significant for dementia, right upper lobe adenocarcinoma status post radiation 2021, chronic A. fib on Eliquis, CKD stage II, hypertension, OSA on CPAP, presented to the hospital with altered mental status and weakness. -In the ED she was noted to be hypoxic with sats of 88%, low-grade fever of 100.7 and A. fib RVR, chest x-ray was suspicious for pneumonia versus fluid overload -She was treated with antibiotics diuretics and IV Cardizem  Assessment/Plan:   New onset systolic CHF -echocardiogram noted significant drop in EF to 20-25% -Appreciate cardiology input, she is not a candidate for ischemic work-up due to advanced age and dementia -Diuresed with IV Lasix, she is 4.8 L negative -Continue Toprol 25 mg daily, also started on Aldactone and losartan at low doses, fortunately kidney function remains normal -Transitioned to oral diuretics -Volume status has improved -Discharge planning  A. fib with RVR -Heart rate improving, continue Toprol, digoxin discontinued -Started on Eliquis, however had history of considerable bleeding from her leg wounds,  after further discussion of risks benefits due to advanced age, bedridden status we decided to forego anticoagulation  Sepsis  poa Community-acquired pneumonia, admitted with low-grade fevers hypoxia and concern for pneumonia along with fluid overload on x-ray -Completed antibiotic course using IV ceftriaxone and doxycycline  -Blood cultures are negative -Resolved  Dementia Metabolic encephalopathy -Mental status has improved, history of advanced dementia.  Mostly bedbound.  Continue Namenda, duloxetine, donepezil -Required significant amount of assistance at ALF -Seen by PT, SNF recommended, social work consulted  Acute hypoxic respiratory  failure secondary to  Pneumonia/history of heart failure.Marland Kitchen Possibility of pneumonia with acute systolic heart failure and rapid A. Fib -Initially required BiPAP, now off -Now stable on BiPAP nightly only -Improved and stable on 2 L O2 via nasal cannula now  History of non-small cell lung cancer -Previously treated with radiation   AKI on CKD stage II -Improved and stable  Left lower extremity wounds.  Routine wound care  Elevated LFT from congestive hepatomegaly.  Improving with diuresis.  DVT prophylaxis: heparin injection 5,000 Units Start: 01/24/21 1400 eliquis  Code Status: DNR  Family Communication: No family at bedside, updated daughter Juliann Pulse  Status is: Inpatient  Remains inpatient appropriate because:IV treatments appropriate due to intensity of illness or inability to take PO and Inpatient level of care appropriate due to severity of illness,   Dispo: The patient is from: Memory care unit              Anticipated d/c is to: SNF               Patient currently is medically stable to d/c.   Anticipated date of discharge when SNF bed is available   Difficult to place patient No  Consultants:  Cardiology  Procedures:  BiPAP  Anti-infectives:  Marland Kitchen Rocephin 2/26> . Doxycycline 2/26>  Anti-infectives (From admission, onward)   Start     Dose/Rate Route Frequency Ordered Stop   01/22/21 1430  vancomycin (VANCOREADY) IVPB 1500 mg/300 mL  Status:  Discontinued        1,500 mg 150 mL/hr over 120 Minutes Intravenous Every 48 hours 01/20/21 1457 01/20/21 1840   01/20/21 2300  cefTRIAXone (ROCEPHIN) 2 g in sodium chloride 0.9 % 100 mL IVPB  Status:  Discontinued  2 g 200 mL/hr over 30 Minutes Intravenous Every 24 hours 01/20/21 2000 01/26/21 1212   01/20/21 2230  ceFEPIme (MAXIPIME) 2 g in sodium chloride 0.9 % 100 mL IVPB  Status:  Discontinued        2 g 200 mL/hr over 30 Minutes Intravenous Every 12 hours 01/20/21 1500 01/20/21 1840   01/20/21 2000   cefTRIAXone (ROCEPHIN) 1 g in sodium chloride 0.9 % 100 mL IVPB  Status:  Discontinued        1 g 200 mL/hr over 30 Minutes Intravenous Every 24 hours 01/20/21 1840 01/20/21 2000   01/20/21 1615  doxycycline (VIBRAMYCIN) 100 mg in sodium chloride 0.9 % 250 mL IVPB  Status:  Discontinued        100 mg 125 mL/hr over 120 Minutes Intravenous Every 12 hours 01/20/21 1609 01/24/21 1110   01/20/21 1145  ceFEPIme (MAXIPIME) 2 g in sodium chloride 0.9 % 100 mL IVPB        2 g 200 mL/hr over 30 Minutes Intravenous  Once 01/20/21 1134 01/20/21 1244   01/20/21 1145  vancomycin (VANCOREADY) IVPB 2000 mg/400 mL        2,000 mg 200 mL/hr over 120 Minutes Intravenous  Once 01/20/21 1134 01/20/21 1625     Subjective: -No events overnight, reports that her breathing is okay, used her BiPAP last night  Objective: Vitals:   01/26/21 0814 01/26/21 1354  BP: 107/81 108/79  Pulse: 66 82  Resp: 20 18  Temp: 97.7 F (36.5 C) 98.7 F (37.1 C)  SpO2: 100% 97%    Intake/Output Summary (Last 24 hours) at 01/26/2021 1410 Last data filed at 01/26/2021 0550 Gross per 24 hour  Intake --  Output 1100 ml  Net -1100 ml   Filed Weights   01/24/21 0500 01/25/21 0638 01/26/21 0647  Weight: 97.6 kg 97.6 kg 98.5 kg   Body mass index is 37.27 kg/m.   Physical Exam: Gen: Obese elderly chronically ill female sitting up in bed eating breakfast, much more alert and appropriate today, oriented to self and partly to place HEENT: Neck obese unable to assess JVD CVS: S1-S2, irregularly irregular rhythm Lungs: Decreased breath sounds at the bases otherwise clear Abdomen: Soft, nontender, bowel sounds present Extremities: 1+ edema Skin: No rashes on exposed skin  Data Review: I have personally reviewed the following laboratory data and studies,  CBC: Recent Labs  Lab 01/20/21 1122 01/20/21 1633 01/21/21 0234 01/22/21 0133 01/23/21 0237 01/24/21 0224  WBC 9.5  --  8.9 7.7 7.9 7.2  HGB 14.9 12.9 13.2 12.4  12.9 12.9  HCT 48.8* 38.0 40.2 38.2 40.5 40.5  MCV 93.5  --  91.0 91.2 92.0 91.4  PLT 284  --  219 210 237 650   Basic Metabolic Panel: Recent Labs  Lab 01/21/21 0234 01/22/21 0133 01/23/21 0237 01/24/21 0224 01/25/21 0807  NA 144 141 141 141 138  K 3.5 3.6 3.5 3.8 3.8  CL 104 106 103 102 98  CO2 28 27 29 27 29   GLUCOSE 100* 88 89 86 97  BUN 24* 18 18 17 16   CREATININE 1.20* 0.88 0.97 0.74 0.77  CALCIUM 9.4 9.2 9.3 9.6 9.5  MG  --  1.8 1.7 1.8  --   PHOS  --  2.8 3.2  --   --    Liver Function Tests: Recent Labs  Lab 01/20/21 1318 01/21/21 0234 01/22/21 0133 01/23/21 0237 01/25/21 0807  AST 117* 381* 145* 78* 54*  ALT 116*  340* 265* 207* 135*  ALKPHOS 92 96 81 78 78  BILITOT 1.8* 1.1 1.4* 0.8 0.8  PROT 6.6 5.6* 5.3* 5.4* 5.5*  ALBUMIN 3.4* 2.9* 2.6* 2.6* 2.7*   No results for input(s): LIPASE, AMYLASE in the last 168 hours. No results for input(s): AMMONIA in the last 168 hours. Cardiac Enzymes: No results for input(s): CKTOTAL, CKMB, CKMBINDEX, TROPONINI in the last 168 hours. BNP (last 3 results) Recent Labs    01/20/21 1705 01/21/21 0234  BNP 1,608.7* 1,367.3*    ProBNP (last 3 results) No results for input(s): PROBNP in the last 8760 hours.  CBG: Recent Labs  Lab 01/20/21 1109  GLUCAP 162*   Recent Results (from the past 240 hour(s))  Blood Culture (routine x 2)     Status: None   Collection Time: 01/20/21 11:33 AM   Specimen: BLOOD  Result Value Ref Range Status   Specimen Description BLOOD LEFT ANTECUBITAL  Final   Special Requests   Final    BOTTLES DRAWN AEROBIC AND ANAEROBIC Blood Culture adequate volume   Culture   Final    NO GROWTH 5 DAYS Performed at [facility], [address], [place], [place] [address]    Report Status 01/25/2021 FINAL  Final  Blood Culture (routine x 2)     Status: None   Collection Time: 01/20/21 11:38 AM   Specimen: BLOOD  Result Value Ref Range Status   Specimen Description BLOOD BLOOD RIGHT HAND   Final   Special Requests   Final    AEROBIC BOTTLE ONLY Blood Culture results may not be optimal due to an inadequate volume of blood received in culture bottles   Culture   Final    NO GROWTH 5 DAYS Performed at [facility], [place] [address], [place], [place] [address]    Report Status 01/25/2021 FINAL  Final  Resp Panel by RT-PCR (Flu A&B, Covid) Nasopharyngeal Swab     Status: None   Collection Time: 01/20/21 12:01 PM   Specimen: Nasopharyngeal Swab; Nasopharyngeal(NP) swabs in vial transport medium  Result Value Ref Range Status   SARS Coronavirus 2 by RT PCR NEGATIVE NEGATIVE Final    Comment: (NOTE) SARS-CoV-2 target nucleic acids are NOT DETECTED.  The SARS-CoV-2 RNA is generally detectable in upper respiratory specimens during the acute phase of infection. The lowest concentration of SARS-CoV-2 viral copies this assay can detect is 138 copies/mL. A negative result does not preclude SARS-Cov-2 infection and should not be used as the sole basis for treatment or other patient management decisions. A negative result may occur with  improper specimen collection/handling, submission of specimen other than nasopharyngeal swab, presence of viral mutation(s) within the areas targeted by this assay, and inadequate number of viral copies(<138 copies/mL). A negative result must be combined with clinical observations, patient history, and epidemiological information. The expected result is Negative.  Fact Sheet for Patients:  EntrepreneurPulse.com.au  Fact Sheet for Healthcare Providers:  IncredibleEmployment.be  This test is no t yet approved or cleared by the [place] FDA and  has been authorized for detection and/or diagnosis of SARS-CoV-2 by FDA under an Emergency Use Authorization (EUA). This EUA will remain  in effect (meaning this test can be used) for the duration of the COVID-19 declaration under Section 564(b)(1) of the Act,  21 U.S.C.section 360bbb-3(b)(1), unless the authorization is terminated  or revoked sooner.       Influenza A by PCR NEGATIVE NEGATIVE Final   Influenza B by  PCR NEGATIVE NEGATIVE Final    Comment: (NOTE) The Xpert Xpress SARS-CoV-2/FLU/RSV plus assay is intended as an aid in the diagnosis of influenza from Nasopharyngeal swab specimens and should not be used as a sole basis for treatment. Nasal washings and aspirates are unacceptable for Xpert Xpress SARS-CoV-2/FLU/RSV testing.  Fact Sheet for Patients: EntrepreneurPulse.com.au  Fact Sheet for Healthcare Providers: IncredibleEmployment.be  This test is not yet approved or cleared by the [place] FDA and has been authorized for detection and/or diagnosis of SARS-CoV-2 by FDA under an Emergency Use Authorization (EUA). This EUA will remain in effect (meaning this test can be used) for the duration of the COVID-19 declaration under Section 564(b)(1) of the Act, 21 U.S.C. section 360bbb-3(b)(1), unless the authorization is terminated or revoked.  Performed at [facility], [place] [address], [place], [place] [address]   Resp Panel by RT-PCR (Flu A&B, Covid) Nasopharyngeal Swab     Status: None   Collection Time: 01/26/21 11:14 AM   Specimen: Nasopharyngeal Swab; Nasopharyngeal(NP) swabs in vial transport medium  Result Value Ref Range Status   SARS Coronavirus 2 by RT PCR NEGATIVE NEGATIVE Final    Comment: (NOTE) SARS-CoV-2 target nucleic acids are NOT DETECTED.  The SARS-CoV-2 RNA is generally detectable in upper respiratory specimens during the acute phase of infection. The lowest concentration of SARS-CoV-2 viral copies this assay can detect is 138 copies/mL. A negative result does not preclude SARS-Cov-2 infection and should not be used as the sole basis for treatment or other patient management decisions. A negative result may occur with  improper specimen  collection/handling, submission of specimen other than nasopharyngeal swab, presence of viral mutation(s) within the areas targeted by this assay, and inadequate number of viral copies(<138 copies/mL). A negative result must be combined with clinical observations, patient history, and epidemiological information. The expected result is Negative.  Fact Sheet for Patients:  EntrepreneurPulse.com.au  Fact Sheet for Healthcare Providers:  IncredibleEmployment.be  This test is no t yet approved or cleared by the [place] FDA and  has been authorized for detection and/or diagnosis of SARS-CoV-2 by FDA under an Emergency Use Authorization (EUA). This EUA will remain  in effect (meaning this test can be used) for the duration of the COVID-19 declaration under Section 564(b)(1) of the Act, 21 U.S.C.section 360bbb-3(b)(1), unless the authorization is terminated  or revoked sooner.       Influenza A by PCR NEGATIVE NEGATIVE Final   Influenza B by PCR NEGATIVE NEGATIVE Final    Comment: (NOTE) The Xpert Xpress SARS-CoV-2/FLU/RSV plus assay is intended as an aid in the diagnosis of influenza from Nasopharyngeal swab specimens and should not be used as a sole basis for treatment. Nasal washings and aspirates are unacceptable for Xpert Xpress SARS-CoV-2/FLU/RSV testing.  Fact Sheet for Patients: EntrepreneurPulse.com.au  Fact Sheet for Healthcare Providers: IncredibleEmployment.be  This test is not yet approved or cleared by the [place] FDA and has been authorized for detection and/or diagnosis of SARS-CoV-2 by FDA under an Emergency Use Authorization (EUA). This EUA will remain in effect (meaning this test can be used) for the duration of the COVID-19 declaration under Section 564(b)(1) of the Act, 21 U.S.C. section 360bbb-3(b)(1), unless the authorization is terminated or revoked.  Performed at [facility], [place] [address], [place], [place] [address]      Studies: No results found.   Domenic Polite, MD  [facility] 01/26/2021

## 2021-01-26 NOTE — TOC Progression Note (Addendum)
Transition of Care [facility]) - Progression Note    Patient Details  Name: Laura Walker MRN: [id number] Date of Birth: 07/22/1930  Transition of Care [facility]) CM/SW [place], [place] Phone Number: 01/26/2021, 2:35 PM  Clinical Narrative:      Update 3/4 4:51pm-CSW received call from patients insurance. PTAR has been approved for patient [phone].Insurance authorization for SNF placement still pending.PASSR number pending.  CSW will continue to follow.   CSW spoke with Raymon Mutton from [facility] and after reviewing patients PT note she is in agreement to patient going to SNF. Raymon Mutton let CSW know that she spoke with patients family and they are in agreement to SNF placement for patient.CSW spoke with patients daughters [name] and [place] and they both were in agreement to patient going to SNF. Patients daughters are in agreement to [place] faxing out initial referral near [place] area.First choice would be [facility]. CSW updated FL2. Passr number for patient is under review. CSW sent over clinicals to [place] must for review. Insurance authorization started. Request for PTAR approval.  Pending SNF bed offers. Pending Passr. Pending insurance authorization. Pending PTAR approval.  CSW will continue to follow.        Expected Discharge Plan and Services                                                 Social Determinants of Health (SDOH) Interventions    Readmission Risk Interventions No flowsheet data found.

## 2021-01-26 NOTE — Evaluation (Signed)
Occupational Therapy Evaluation Patient Details Name: Laura Walker MRN: [id number] DOB: 01/13/1930 Today's Date: 01/26/2021    History of Present Illness 87 y.o. female , presented to the hospital on 01/20/2021 with altered mental status and weakness, chest x-ray suspicious for PNA in ED. PMH: dementia, right upper lobe adenocarcinoma status post radiation 2021, chronic A. fib on Eliquis, CKD stage II, hypertension, OSA on CPAP.   Clinical Impression   Patient admitted for the diagnosis above.  PTA she was a one person assist to a wheelchair.  She was able to feed herself and participate with light grooming activities.  Deficits to improved independence are listed below.  Currently she is needing up to Min A for edge of bed sitting, and up to mod A for grooming tasks and self feeding.  The plan is for SNF post acute to hopefully improve stand pivots enough for her to return to her ALF/memory care.  OT will see her while she is here to maximize her functional status for an eventual transition to SNF.     Follow Up Recommendations  SNF    Equipment Recommendations  None recommended by OT    Recommendations for Other Services       Precautions / Restrictions Precautions Precautions: Fall Restrictions Weight Bearing Restrictions: No      Mobility Bed Mobility Overal bed mobility: Needs Assistance Bed Mobility: Supine to Sit;Sit to Supine     Supine to sit: Min assist Sit to supine: Mod assist;Max assist     Patient Response: Cooperative  Transfers   Equipment used: 1 person hand held assist Transfers: Sit to/from Stand Sit to Stand: Max assist              Balance Overall balance assessment: Needs assistance Sitting-balance support: Single extremity supported;Feet supported Sitting balance-Leahy Scale: Poor   Postural control: Posterior lean;Left lateral lean                                 ADL either performed or assessed with clinical judgement    ADL Overall ADL's : Needs assistance/impaired Eating/Feeding: Moderate assistance;Bed level   Grooming: Wash/dry hands;Wash/dry face;Oral care;Moderate assistance;Bed level                                 General ADL Comments: patient is most likely at her baseline for the remaining ADL and toileting status.     Vision Patient Visual Report: No change from baseline       Perception     Praxis      Pertinent Vitals/Pain Pain Assessment: Faces Faces Pain Scale: No hurt Pain Intervention(s): Monitored during session     Hand Dominance Right   Extremity/Trunk Assessment Upper Extremity Assessment Upper Extremity Assessment: Generalized weakness   Lower Extremity Assessment Lower Extremity Assessment: Generalized weakness   Cervical / Trunk Assessment Cervical / Trunk Assessment: Kyphotic   Communication Communication Communication: HOH   Cognition Arousal/Alertness: Awake/alert Behavior During Therapy: Flat affect Overall Cognitive Status: History of cognitive impairments - at baseline                                 General Comments: baseline dementia, family is very supportive and realistic regarding her rehab goals.   General Comments  Home Living Family/patient expects to be discharged to:: Skilled nursing facility                                 Additional Comments: pt lives at [facility] in memory care      Prior Functioning/Environment Level of Independence: Needs assistance  Gait / Transfers Assistance Needed: pt requires assistance for stand pivot transfers to wheelchair, pt is dependent for wheelchair mobility. Pt transfers many times a day with staff assistance. ADL's / Homemaking Assistance Needed: pt requires assistance for all ADLs except self feeding and light grooming.            OT Problem List: Decreased strength;Impaired balance (sitting and/or standing)      OT  Treatment/Interventions: Self-care/ADL training;Therapeutic activities;Balance training    OT Goals(Current goals can be found in the care plan section) Acute Rehab OT Goals Patient Stated Goal: Family is wanting her to be able to stand pivot to a w/c with one assist so she can return to ALF OT Goal Formulation: With family Time For Goal Achievement: 02/09/21 ADL Goals Pt Will Perform Grooming: with min assist;sitting Additional ADL Goal #1: Patient will sit edge of bed for up to 10 min with min guard to perform light grooming task.  OT Frequency: Min 1X/week   Barriers to D/C:    none noted       Co-evaluation              AM-PAC OT "6 Clicks" Daily Activity     Outcome Measure Help from another person eating meals?: A Lot Help from another person taking care of personal grooming?: A Lot Help from another person toileting, which includes using toliet, bedpan, or urinal?: Total Help from another person bathing (including washing, rinsing, drying)?: Total Help from another person to put on and taking off regular upper body clothing?: Total Help from another person to put on and taking off regular lower body clothing?: Total 6 Click Score: 8   End of Session Equipment Utilized During Treatment: Oxygen Nurse Communication: Mobility status  Activity Tolerance: Patient tolerated treatment well Patient left: in bed;with call bell/phone within reach;with family/visitor present  OT Visit Diagnosis: Unsteadiness on feet (R26.81);Other symptoms and signs involving cognitive function                Time: 3709-6438 OT Time Calculation (min): 18 min Charges:  OT General Charges $OT Visit: 1 Visit OT Evaluation $OT Eval Moderate Complexity: 1 Mod  01/26/2021  Rich, OTR/L  Acute Rehabilitation Services  Office:  [phone]   Metta Clines 01/26/2021, 4:10 PM

## 2021-01-27 NOTE — TOC Progression Note (Addendum)
Transition of Care [facility]) - Progression Note    Patient Details  Name: Laura Walker MRN: [id number] Date of Birth: 1930/10/06  Transition of Care [facility] CM/SW [place], [place] Phone Number: 01/27/2021, 3:44 PM  Clinical Narrative:    CSW spoke with Marlowe Kays from [facility] concerning pt's denial for SNF. CSW met with pt's daughter [name] to present insurance denial letter for SNF.  Pt's daughter [name] would like to speak with palliative care for following  pt when returning to [facility].  CSW contacted pt's physician by secure chat to place a consult order for palliative care to speak with family.  CSW returned phone call to [employer] [facility] after delivering denial letter to daughter [name].  TOC will continue to follow for disposition planning.        Expected Discharge Plan and Services                                                 Social Determinants of Health (SDOH) Interventions    Readmission Risk Interventions No flowsheet data found.

## 2021-01-27 NOTE — Progress Notes (Signed)
Physical Therapy Treatment Patient Details Name: Laura Walker MRN: [id number] DOB: 1930-04-01 Today's Date: 01/27/2021    History of Present Illness 87 y.o. female , presented to the hospital on 01/20/2021 with altered mental status and weakness, chest x-ray suspicious for PNA in ED. PMH: dementia, right upper lobe adenocarcinoma status post radiation 2021, chronic A. fib on Eliquis, CKD stage II, hypertension, OSA on CPAP.    PT Comments    Pt continues to require heavy assist for bed mobility and is unable to stand or get to chair safely without mechanical lift. Insurance has denied SNF. She can return to her prior ALF if they can provide this assist. Would expect progress with mobility to be very slow and will likely need significant assist for extended time.    Follow Up Recommendations  Other (comment) (return to ALF if they can provide total care for pt)     Equipment Recommendations  Other (comment) (mechanical lift)    Recommendations for Other Services       Precautions / Restrictions Precautions Precautions: Fall    Mobility  Bed Mobility Overal bed mobility: Needs Assistance Bed Mobility: Supine to Sit;Sit to Supine     Supine to sit: Max assist;HOB elevated Sit to supine: Max assist;HOB elevated   General bed mobility comments: Assist for all aspects    Transfers Overall transfer level: Needs assistance               General transfer comment: Attempted to stand from EOB but unable to rise with 1 person assist  Ambulation/Gait                 Stairs             Wheelchair Mobility    Modified Rankin (Stroke Patients Only)       Balance Overall balance assessment: Needs assistance Sitting-balance support: Single extremity supported;Feet supported Sitting balance-Leahy Scale: Poor Sitting balance - Comments: minA due to left lean Postural control: Left lateral lean                                  Cognition  Arousal/Alertness: Awake/alert Behavior During Therapy: Flat affect Overall Cognitive Status: History of cognitive impairments - at baseline                                 General Comments: Pt with baseline dementia. Pt with only 1-2 words verbalized. Followed most 1 step commands      Exercises      General Comments        Pertinent Vitals/Pain Pain Assessment: Faces Faces Pain Scale: No hurt    Home Living                      Prior Function            PT Goals (current goals can now be found in the care plan section) Acute Rehab PT Goals Patient Stated Goal: Pt unable to state PT Goal Formulation: With patient/family Time For Goal Achievement: 02/08/21 Potential to Achieve Goals: Fair    Frequency    Min 2X/week      PT Plan Discharge plan needs to be updated    Co-evaluation              AM-PAC PT "6 Clicks" Mobility   Outcome  Measure  Help needed turning from your back to your side while in a flat bed without using bedrails?: Total Help needed moving from lying on your back to sitting on the side of a flat bed without using bedrails?: Total Help needed moving to and from a bed to a chair (including a wheelchair)?: Total Help needed standing up from a chair using your arms (e.g., wheelchair or bedside chair)?: Total Help needed to walk in hospital room?: Total Help needed climbing 3-5 steps with a railing? : Total 6 Click Score: 6    End of Session   Activity Tolerance: Patient limited by fatigue Patient left: in bed;with call bell/phone within reach;with bed alarm set Nurse Communication: Mobility status;Need for lift equipment PT Visit Diagnosis: Other abnormalities of gait and mobility (R26.89);Muscle weakness (generalized) (M62.81)     Time: 1350-1406 PT Time Calculation (min) (ACUTE ONLY): 16 min  Charges:  $Therapeutic Activity: 8-22 mins                     [place] Pager [phone] Office [place] 01/27/2021, 2:18 PM

## 2021-01-27 NOTE — Progress Notes (Signed)
Patient seen and examined, -No changes from my note yesterday -More conversant today -Out of bed to chair as tolerated -PT to re-evaluate this weekend, used a wheel chair at baseline at [place] [place] [facility]  Domenic Polite, MD

## 2021-01-27 NOTE — TOC Progression Note (Signed)
Transition of Care [facility]) - Progression Note    Patient Details  Name: Laura Walker MRN: [id number] Date of Birth: [birth year]  Transition [facility]) CM/SW [place], LCSW Phone Number: [phone] 01/27/2021, 9:07 AM  Clinical Narrative:     CSW received a call from [place] at [facility] advantage and she notified CSW that patient authorization has been provisionally denied. MD could do a peer to peer if she feels that SNF is appropriate. [employer] medical director feels that patient is too sick to benefit from a SNF.  Peer to peer can be conducted by calling [phone] with Dr. Marco Collie by [facility] today.     TOC team will continue to assist with discharge planning needs.>TO    Expected Discharge Plan and Services                                                 Social Determinants of Health (SDOH) Interventions    Readmission Risk Interventions No flowsheet data found.

## 2021-01-28 DIAGNOSIS — Z66 Do not resuscitate: Secondary | ICD-10-CM

## 2021-01-28 DIAGNOSIS — C3491 Malignant neoplasm of unspecified part of right bronchus or lung: Secondary | ICD-10-CM

## 2021-01-28 DIAGNOSIS — Z515 Encounter for palliative care: Secondary | ICD-10-CM

## 2021-01-28 DIAGNOSIS — R5381 Other malaise: Secondary | ICD-10-CM

## 2021-01-28 DIAGNOSIS — Z789 Other specified health status: Secondary | ICD-10-CM

## 2021-01-28 DIAGNOSIS — Z7189 Other specified counseling: Secondary | ICD-10-CM

## 2021-01-28 DIAGNOSIS — F0390 Unspecified dementia without behavioral disturbance: Secondary | ICD-10-CM

## 2021-01-28 NOTE — Consult Note (Signed)
Consultation Note Date: 01/28/2021   Patient Name: Laura Walker  DOB: 11-03-1930  MRN: [id number]  Age / Sex: 87 y.o., female  PCP: Laura Jordan, MD Referring Physician: Domenic Polite, MD  Reason for Consultation: Establishing goals of care  HPI/Patient Profile: 87 y.o. female  with past medical history of OSA, dementia, CKD, right upper lobe adenocarcinoma s/p radiation in 2020-01-24, atrial fibrillation presented to the ED on 2/26 from [place] with staff complaints of AMS and weakness. In the ED she was noted to be hypoxic with sats of 88%, low-grade fever of 100.7 and A. fib RVR, chest x-ray was suspicious for pneumonia versus fluid overload. She was admitted on 01/20/2021 with atrial fibrillation with RVR, acute metabolic encephalopathy, acute hypoxic respiratory failure, new onset systolic HF with EF 79-89%. She was treated with antibiotics diuretics and IV Cardizem. Patient has improved and stable however more debilitated.   Clinical Assessment and Goals of Care: I have reviewed medical records including EPIC notes, labs, and imaging. Received report from primary RN - no acute concerns.   Went to visit patient at bedside - two daughters, Laura Walker and Laura Walker, were present. Patient was lying in bed asleep - she does wake to voice/gentle touch and smiles when I talk to her - she answers questions mostly with "yeah." She is not able to participate in conversation or complex medical decision making. She sleeps the majority of my visit but does wake intermittently. No signs or non-verbal gestures of pain or discomfort noted. No respiratory distress, increased work of breathing, or secretions noted.   Met with Laura Walker and Laura Walker  to discuss diagnosis, prognosis, GOC, EOL wishes, disposition, and options.  I introduced Palliative Medicine as specialized medical care for people living with serious illness. It  focuses on providing relief from the symptoms and stress of a serious illness. The goal is to improve quality of life for both the patient and the family.  We discussed a brief life review of the patient as well as functional and nutritional status. Patient is a retired high school Psychologist, prison and probation services. Her husband passed away in January 23, 1998 - they had 5 children together. Prior to this hospitalization, patient was living at [facility] and has been there since November 2021. Family have noticed a decline in her overall function and mental status since April of 2021. She was diagnosed with cancer in 2020/01/24 and underwent radiation treatment from March-April 2021, unfortunately without success. Family have not pursued other treatment options since that time as these treatments made the patient "slow down" and "sleepy." Several weeks ago, the patient was able to ambulate with a rollator and feed herself. Now, she is bedbound, incontinent of B/B, sleeping most of the day and night, and needs assistance with feeding.   We discussed patient's current illness and what it means in the larger context of patient's on-going co-morbidities. Family understands that dementia is a progressive, non-curable disease underlying the patient's current acute medical conditions. Family also understand patient is not a candidate for heart catheterization -  but express they are not interested in aggressive interventions for patient. Natural disease trajectory for dementia and expectations at EOL were discussed. I attempted to elicit values and goals of care important to the patient. The difference between aggressive medical intervention and comfort care was considered in light of the patient's goals of care. Family are clear in their desire to keep the patient comfortable for the time she has left. After patient is discharged, they do not want her readmitted to the hospital.   Hospice and Palliative Care services outpatient were explained  and offered. Family are hoping for hospice services to be able to support patient at [facility].  Advance directives, concepts specific to code status, artificial feeding and hydration, and rehospitalization were considered and discussed. Patient's daughter/Laura is her HCPOA - copy can be seen under ACP tab. Family are clear in their desire for patient to remain DNR/DNI. Introduced, reviewed, and completed MOST form as outlined under recommendation section below.  Discussed with patient/family the importance of continued conversation with each other and the medical providers regarding overall plan of care and treatment options, ensuring decisions are within the context of the patients values and GOCs.    Questions and concerns were addressed. The patient/family was encouraged to call with questions and/or concerns. PMT card was provided.  Primary Decision Maker: HCPOA - daughter/Laura Walker    SUMMARY OF RECOMMENDATIONS    Continue current medical treatment  Continue DNR/DNI - durable DNR form completed and placed in shadow chart; copy was made and will be scanned into Vynca  Family's goals are for patient to return to [place] with hospice - they are ready for patient's discharge once DME is set up at facility  Family do not want patient rehospitalized once discharged  TOC notified and consulted for: referral for home hospice services at [facility] form completed as follows: DNR and DNI, Comfort Measures, Determine use or limitation of antibiotics when infection occurs, No IVF, No feeding tube. Original placed in shadow chart; copy was made and will be scanned into ACP tab/Vynca.  PMT will continue to follow peripherally. If there are any imminent needs please call the service directly  Code Status/Advance Care Planning:  DNR  Palliative Prophylaxis:   Aspiration, Bowel Regimen, Delirium Protocol, Frequent Pain Assessment, Oral Care and Turn Reposition  Additional  Recommendations (Limitations, Scope, Preferences):  No Artificial Feeding, No Chemotherapy, No Hemodialysis, No IV Fluids, No Radiation, No Surgical Procedures and No Tracheostomy  Psycho-social/Spiritual:   Desire for further Chaplaincy support:no Created space and opportunity for patient and family to express thoughts and feelings regarding patient's current medical situation.   Emotional support and therapeutic listening provided.  Prognosis:   < 6 months  Discharge Planning: [place] with Hospice      Primary Diagnoses: Present on Admission:  Severe sepsis ([place])  A-fib ([place])  Persistent atrial fibrillation (HCC)  Left bundle branch block  Acute systolic CHF (congestive heart failure) (HCC)  CKD (chronic kidney disease), stage II   I have reviewed the medical record, interviewed the patient and family, and examined the patient. The following aspects are pertinent.  Past Medical History:  Diagnosis Date   Anxiety    Arthritis    Cancer ([place])    CKD (chronic kidney disease)    Dementia ([place])    Depression    Dysrhythmia    new onset AFIB.    Hypercholesteremia    Memory loss    OSA (obstructive sleep apnea)  CPAP   Osteopenia    Social History   Socioeconomic History   Marital status: Widowed    Spouse name: Not on file   Number of children: 5   Years of education: 47   Highest education level: Not on file  Occupational History    Comment: retired sub teacher/tutor  Tobacco Use   Smoking status: Former Smoker    Quit date: 12/13/1984    Years since quitting: 36.1   Smokeless tobacco: Never Used  Vaping Use   Vaping Use: Never used  Substance and Sexual Activity   Alcohol use: Yes    Comment: occas wine   Drug use: No   Sexual activity: Not on file  Other Topics Concern   Not on file  Social History Narrative   Lives with daughter   Caffeine- coffee 2 cups, occas tea   Social Determinants of Health    Financial Resource Strain: Not on file  Food Insecurity: Not on file  Transportation Needs: Not on file  Physical Activity: Not on file  Stress: Not on file  Social Connections: Not on file   Family History  Problem Relation Age of Onset   Cancer - Lung Mother    Heart disease Father    Atrial fibrillation Sister    Scheduled Meds:  buPROPion  75 mg Oral q morning   donepezil  10 mg Oral QHS   DULoxetine  30 mg Oral Daily   furosemide  20 mg Oral Daily   heparin  5,000 Units Subcutaneous Q8H   ipratropium  0.5 mg Nebulization BID   losartan  25 mg Oral Daily   memantine  10 mg Oral BID   metoprolol succinate  12.5 mg Oral Daily   oxybutynin  5 mg Oral Q1500   sodium chloride flush  3 mL Intravenous Q12H   spironolactone  12.5 mg Oral Daily   Continuous Infusions:  sodium chloride     PRN Meds:.sodium chloride, acetaminophen, ipratropium, ondansetron (ZOFRAN) IV, sodium chloride flush Medications Prior to Admission:  Prior to Admission medications   Medication Sig Start Date End Date Taking? Authorizing Provider  apixaban (ELIQUIS) 5 MG TABS tablet Take 1 tablet (5 mg total) by mouth 2 (two) times daily. 02/18/20  Yes Jerline Pain, MD  buPROPion (WELLBUTRIN) 75 MG tablet Take 75 mg by mouth every morning. 12/07/20  Yes [provider]  Cholecalciferol (VITAMIN D) 50 MCG (2000 UT) tablet Take 2,000 Units by mouth daily.   Yes [provider]  ciclopirox (PENLAC) 8 % solution Apply 1 drop topically daily. On toenails 08/01/18  Yes [provider]  donepezil (ARICEPT) 10 MG tablet TAKE 1 TABLET BY MOUTH AT BEDTIME Patient taking differently: Take 10 mg by mouth at bedtime. 09/07/19  Yes Penumalli, Earlean Polka, MD  DULoxetine (CYMBALTA) 30 MG capsule Take 30 mg by mouth daily.   Yes [provider]  meloxicam (MOBIC) 7.5 MG tablet Take 15 mg by mouth as needed (knee pain). 08/01/18  Yes [provider]  memantine (NAMENDA)  10 MG tablet Take 1 tablet by mouth twice daily 09/07/19  Yes Penumalli, Vikram R, MD  nystatin (MYCOSTATIN/NYSTOP) powder Apply 1 application topically 2 (two) times daily.   Yes [provider]  Omega-3 Fatty Acids (FISH OIL ULTRA) 1400 MG CAPS Take 1,400 mg by mouth daily.   Yes [provider]  oxybutynin (DITROPAN-XL) 5 MG 24 hr tablet Take 5 mg by mouth daily in the afternoon.   Yes  [provider]  Red Yeast Rice 600 MG CAPS Take 600 mg by mouth daily in the afternoon.   Yes [provider]  vitamin B-12 (CYANOCOBALAMIN) 1000 MCG tablet Take 1,000 mcg by mouth daily.   Yes [provider]   Allergies  Allergen Reactions   Tramadol Nausea Only   Review of Systems  Unable to perform ROS: Dementia    Physical Exam Vitals and nursing note reviewed.  Constitutional:      General: She is not in acute distress. Pulmonary:     Effort: No respiratory distress.  Skin:    General: Skin is warm and dry.  Neurological:     Mental Status: She is lethargic, disoriented and confused.     Motor: Weakness present.  Psychiatric:        Speech: She is noncommunicative.        Cognition and Memory: Cognition is impaired. Memory is impaired.     Vital Signs: BP 122/75    Pulse 80    Temp 98.1 F (36.7 C) (Oral)    Resp 20    Ht _0  (1.626 m)    Wt 104.8 kg    SpO2 99%    BMI 39.66 kg/m  Pain Scale: 0-10   Pain Score: 0-No pain   SpO2: SpO2: 99 % O2 Device:SpO2: 99 % O2 Flow Rate: .O2 Flow Rate (L/min): 2 L/min  IO: Intake/output summary:   Intake/Output Summary (Last 24 hours) at 01/28/2021 1300 Last data filed at 01/28/2021 1119 Gross per 24 hour  Intake 3 ml  Output 1700 ml  Net -1697 ml    LBM: Last BM Date:  (UTA) Baseline Weight: Weight: 102 kg Most recent weight: Weight: 104.8 kg     Palliative Assessment/Data: PPS 20-30%     Time In: 1300 Time Out: 1413 Time Total: 73 minutes  Greater than 50%  of this time was  spent counseling and coordinating care related to the above assessment and plan.  Signed by: Lin Landsman, NP   Please contact Palliative Medicine Team phone at [phone] for questions and concerns.  For individual provider: See Shea Evans

## 2021-01-28 NOTE — TOC Progression Note (Signed)
Transition of Care [facility]) - Progression Note    Patient Details  Name: Laura Walker MRN: [id number] Date of Birth: Aug 12, 1930  Transition of Care [facility]) CM/SW [place], LCSW Phone Number: [phone] 01/28/2021, 4:10 PM  Clinical Narrative:     CSW attempted to call Durenda Age about patient returning and was informed that it would be best to try back tomorrow. CSW inquired about staff Donnetta Simpers and was informed that she does not work on the weekends.  TOC team will continue to assist with discharge planning needs.       Expected Discharge Plan and Services                                                 Social Determinants of Health (SDOH) Interventions    Readmission Risk Interventions No flowsheet data found.

## 2021-01-28 NOTE — Progress Notes (Signed)
PROGRESS NOTE  Laura Walker YJE:[id number] DOB: [birth year] DOA: 01/20/2021 PCP: [name], MD   LOS: 8 days   Brief narrative:  Laura Walker is a 87 y.o. female with medical history significant for dementia, right upper lobe adenocarcinoma status post radiation 2021, chronic A. fib on Eliquis, CKD stage II, hypertension, OSA on CPAP, presented to the hospital with altered mental status and weakness. -In the ED she was noted to be hypoxic with sats of 88%, low-grade fever of 100.7 and A. fib RVR, chest x-ray was suspicious for pneumonia versus fluid overload -She was treated with antibiotics diuretics and IV Cardizem -Improved and stable however more debilitated  Assessment/Plan:   New onset systolic CHF -Echo with EF down to 20-25%  -Seen by cardiology in consultation, due to advanced age and dementia not a candidate for ischemic work-up  -Diuresed with IV Lasix, she is -5 L, volume status has improved considerably -continue Toprol, Aldactone and losartan at low doses  -Continue oral Lasix  -Continue discharge planning, short-term rehab recommended however this may not be an option -Palliative care consulted  A. fib with RVR -Heart rate improving, continue Toprol, digoxin discontinued -Started on Eliquis, however had history of considerable bleeding from her leg wounds,  after further discussion of risks benefits due to advanced age, bedridden status we decided to forego anticoagulation  Sepsis  poa Community-acquired pneumonia, admitted with low-grade fevers hypoxia and concern for pneumonia along with fluid overload on x-ray -Completed antibiotic course using IV ceftriaxone and doxycycline  -Blood cultures are negative -Resolved  Dementia Metabolic encephalopathy -Mental status has improved, history of dementia.   -At baseline is mostly bed and wheelchair-bound  -Continue Namenda, duloxetine, donepezil -Required significant amount of assistance at ALF -Seen by PT,  SNF recommended, social work consulted, short-term rehab declined for now  Acute hypoxic respiratory failure secondary to  Pneumonia/history of heart failure.Marland Kitchen Possibility of pneumonia with acute systolic heart failure and rapid A. Fib -Initially required BiPAP, now off -Now stable on BiPAP nightly only -Improved and stable on 2 L O2 via nasal cannula now  History of non-small cell lung cancer -Previously treated with radiation   AKI on CKD stage II -Improved and stable  Left lower extremity wounds.  Routine wound care  Elevated LFT from congestive hepatomegaly.  Improving with diuresis.  DVT prophylaxis: heparin injection 5,000 Units Start: 01/24/21 1400 eliquis  Code Status: DNR Family Communication: Daughters at bedside  Status is: Inpatient  Remains inpatient appropriate because:IV treatments appropriate due to intensity of illness or inability to take PO and Inpatient level of care appropriate due to severity of illness,   Dispo: The patient is from: Memory care unit              Anticipated d/c is to: Possibly back to ALF with [facility] or Hospice              Patient currently is medically stable to d/c.   Anticipated date of discharge: ?2days   Difficult to place patient No  Consultants:  Cardiology  Procedures:  BiPAP  Anti-infectives:  Marland Kitchen Rocephin 2/26> . Doxycycline 2/26>  Anti-infectives (From admission, onward)   Start     Dose/Rate Route Frequency Ordered Stop   01/22/21 1430  vancomycin (VANCOREADY) IVPB 1500 mg/300 mL  Status:  Discontinued        1,500 mg 150 mL/hr over 120 Minutes Intravenous Every 48 hours 01/20/21 1457 01/20/21 1840   01/20/21 2300  cefTRIAXone (ROCEPHIN) 2 g  in sodium chloride 0.9 % 100 mL IVPB  Status:  Discontinued        2 g 200 mL/hr over 30 Minutes Intravenous Every 24 hours 01/20/21 2000 01/26/21 1212   01/20/21 2230  ceFEPIme (MAXIPIME) 2 g in sodium chloride 0.9 % 100 mL IVPB  Status:  Discontinued        2 g 200 mL/hr  over 30 Minutes Intravenous Every 12 hours 01/20/21 1500 01/20/21 1840   01/20/21 2000  cefTRIAXone (ROCEPHIN) 1 g in sodium chloride 0.9 % 100 mL IVPB  Status:  Discontinued        1 g 200 mL/hr over 30 Minutes Intravenous Every 24 hours 01/20/21 1840 01/20/21 2000   01/20/21 1615  doxycycline (VIBRAMYCIN) 100 mg in sodium chloride 0.9 % 250 mL IVPB  Status:  Discontinued        100 mg 125 mL/hr over 120 Minutes Intravenous Every 12 hours 01/20/21 1609 01/24/21 1110   01/20/21 1145  ceFEPIme (MAXIPIME) 2 g in sodium chloride 0.9 % 100 mL IVPB        2 g 200 mL/hr over 30 Minutes Intravenous  Once 01/20/21 1134 01/20/21 1244   01/20/21 1145  vancomycin (VANCOREADY) IVPB 2000 mg/400 mL        2,000 mg 200 mL/hr over 120 Minutes Intravenous  Once 01/20/21 1134 01/20/21 1625     Subjective: -No events overnight, tolerated BiPAP last night, daughters at bedside  Objective: Vitals:   01/28/21 0924 01/28/21 0930  BP:  122/75  Pulse:  80  Resp: 20   Temp:    SpO2:  99%    Intake/Output Summary (Last 24 hours) at 01/28/2021 1111 Last data filed at 01/28/2021 0300 Gross per 24 hour  Intake 3 ml  Output 800 ml  Net -797 ml   Filed Weights   01/26/21 0647 01/27/21 0423 01/28/21 0437  Weight: 98.5 kg 98.9 kg 104.8 kg   Body mass index is 39.66 kg/m.   Physical Exam: Gen: Obese chronically ill elderly female somnolent but easily arousable laying in bed, awake and alert, oriented to self HEENT: Neck obese unable to assess JVD CVS: S1-S2, irregularly irregular rhythm Lungs: Decreased breath sounds the bases Abdomen: Soft, nontender, bowel sounds present Extremities: Trace edema Skin: No rashes on exposed skin  Data Review: I have personally reviewed the following laboratory data and studies,  CBC: Recent Labs  Lab 01/22/21 0133 01/23/21 0237 01/24/21 0224  WBC 7.7 7.9 7.2  HGB 12.4 12.9 12.9  HCT 38.2 40.5 40.5  MCV 91.2 92.0 91.4  PLT 210 237 355   Basic Metabolic  Panel: Recent Labs  Lab 01/22/21 0133 01/23/21 0237 01/24/21 0224 01/25/21 0807  NA 141 141 141 138  K 3.6 3.5 3.8 3.8  CL 106 103 102 98  CO2 27 29 27 29   GLUCOSE 88 89 86 97  BUN 18 18 17 16   CREATININE 0.88 0.97 0.74 0.77  CALCIUM 9.2 9.3 9.6 9.5  MG 1.8 1.7 1.8  --   PHOS 2.8 3.2  --   --    Liver Function Tests: Recent Labs  Lab 01/22/21 0133 01/23/21 0237 01/25/21 0807  AST 145* 78* 54*  ALT 265* 207* 135*  ALKPHOS 81 78 78  BILITOT 1.4* 0.8 0.8  PROT 5.3* 5.4* 5.5*  ALBUMIN 2.6* 2.6* 2.7*   No results for input(s): LIPASE, AMYLASE in the last 168 hours. No results for input(s): AMMONIA in the last 168 hours. Cardiac Enzymes: No  results for input(s): CKTOTAL, CKMB, CKMBINDEX, TROPONINI in the last 168 hours. BNP (last 3 results) Recent Labs    01/20/21 1705 01/21/21 0234  BNP 1,608.7* 1,367.3*    ProBNP (last 3 results) No results for input(s): PROBNP in the last 8760 hours.  CBG: No results for input(s): GLUCAP in the last 168 hours. Recent Results (from the past 240 hour(s))  Blood Culture (routine x 2)     Status: None   Collection Time: 01/20/21 11:33 AM   Specimen: BLOOD  Result Value Ref Range Status   Specimen Description BLOOD LEFT ANTECUBITAL  Final   Special Requests   Final    BOTTLES DRAWN AEROBIC AND ANAEROBIC Blood Culture adequate volume   Culture   Final    NO GROWTH 5 DAYS Performed at [facility], [address] [place], [place] [address]    Report Status 01/25/2021 FINAL  Final  Blood Culture (routine x 2)     Status: None   Collection Time: 01/20/21 11:38 AM   Specimen: BLOOD  Result Value Ref Range Status   Specimen Description BLOOD BLOOD RIGHT HAND  Final   Special Requests   Final    AEROBIC BOTTLE ONLY Blood Culture results may not be optimal due to an inadequate volume of blood received in culture bottles   Culture   Final    NO GROWTH 5 DAYS Performed at [facility], [place] [address] [place],  [place] [address]    Report Status 01/25/2021 FINAL  Final  Resp Panel by RT-PCR (Flu A&B, Covid) Nasopharyngeal Swab     Status: None   Collection Time: 01/20/21 12:01 PM   Specimen: Nasopharyngeal Swab; Nasopharyngeal(NP) swabs in vial transport medium  Result Value Ref Range Status   SARS Coronavirus 2 by RT PCR NEGATIVE NEGATIVE Final    Comment: (NOTE) SARS-CoV-2 target nucleic acids are NOT DETECTED.  The SARS-CoV-2 RNA is generally detectable in upper respiratory specimens during the acute phase of infection. The lowest concentration of SARS-CoV-2 viral copies this assay can detect is 138 copies/mL. A negative result does not preclude SARS-Cov-2 infection and should not be used as the sole basis for treatment or other patient management decisions. A negative result may occur with  improper specimen collection/handling, submission of specimen other than nasopharyngeal swab, presence of viral mutation(s) within the areas targeted by this assay, and inadequate number of viral copies(<138 copies/mL). A negative result must be combined with clinical observations, patient history, and epidemiological information. The expected result is Negative.  Fact Sheet for Patients:  EntrepreneurPulse.com.au  Fact Sheet for Healthcare Providers:  IncredibleEmployment.be  This test is no t yet approved or cleared by the [place] FDA and  has been authorized for detection and/or diagnosis of SARS-CoV-2 by FDA under an Emergency Use Authorization (EUA). This EUA will remain  in effect (meaning this test can be used) for the duration of the COVID-19 declaration under Section 564(b)(1) of the Act, 21 U.S.C.section 360bbb-3(b)(1), unless the authorization is terminated  or revoked sooner.       Influenza A by PCR NEGATIVE NEGATIVE Final   Influenza B by PCR NEGATIVE NEGATIVE Final    Comment: (NOTE) The Xpert Xpress SARS-CoV-2/FLU/RSV plus assay is intended as  an aid in the diagnosis of influenza from Nasopharyngeal swab specimens and should not be used as a sole basis for treatment. Nasal washings and aspirates are unacceptable for Xpert Xpress SARS-CoV-2/FLU/RSV testing.  Fact Sheet for Patients: EntrepreneurPulse.com.au  Fact  Sheet for Healthcare Providers: IncredibleEmployment.be  This test is not yet approved or cleared by the [place] and has been authorized for detection and/or diagnosis of SARS-CoV-2 by FDA under an Emergency Use Authorization (EUA). This EUA will remain in effect (meaning this test can be used) for the duration of the COVID-19 declaration under Section 564(b)(1) of the Act, 21 U.S.C. section 360bbb-3(b)(1), unless the authorization is terminated or revoked.  Performed at [facility], [place] [address] [place], [place] [address]   Resp Panel by RT-PCR (Flu A&B, Covid) Nasopharyngeal Swab     Status: None   Collection Time: 01/26/21 11:14 AM   Specimen: Nasopharyngeal Swab; Nasopharyngeal(NP) swabs in vial transport medium  Result Value Ref Range Status   SARS Coronavirus 2 by RT PCR NEGATIVE NEGATIVE Final    Comment: (NOTE) SARS-CoV-2 target nucleic acids are NOT DETECTED.  The SARS-CoV-2 RNA is generally detectable in upper respiratory specimens during the acute phase of infection. The lowest concentration of SARS-CoV-2 viral copies this assay can detect is 138 copies/mL. A negative result does not preclude SARS-Cov-2 infection and should not be used as the sole basis for treatment or other patient management decisions. A negative result may occur with  improper specimen collection/handling, submission of specimen other than nasopharyngeal swab, presence of viral mutation(s) within the areas targeted by this assay, and inadequate number of viral copies(<138 copies/mL). A negative result must be combined with clinical observations, patient history, and  epidemiological information. The expected result is Negative.  Fact Sheet for Patients:  EntrepreneurPulse.com.au  Fact Sheet for Healthcare Providers:  IncredibleEmployment.be  This test is no t yet approved or cleared by the [place] FDA and  has been authorized for detection and/or diagnosis of SARS-CoV-2 by FDA under an Emergency Use Authorization (EUA). This EUA will remain  in effect (meaning this test can be used) for the duration of the COVID-19 declaration under Section 564(b)(1) of the Act, 21 U.S.C.section 360bbb-3(b)(1), unless the authorization is terminated  or revoked sooner.       Influenza A by PCR NEGATIVE NEGATIVE Final   Influenza B by PCR NEGATIVE NEGATIVE Final    Comment: (NOTE) The Xpert Xpress SARS-CoV-2/FLU/RSV plus assay is intended as an aid in the diagnosis of influenza from Nasopharyngeal swab specimens and should not be used as a sole basis for treatment. Nasal washings and aspirates are unacceptable for Xpert Xpress SARS-CoV-2/FLU/RSV testing.  Fact Sheet for Patients: EntrepreneurPulse.com.au  Fact Sheet for Healthcare Providers: IncredibleEmployment.be  This test is not yet approved or cleared by the [place] FDA and has been authorized for detection and/or diagnosis of SARS-CoV-2 by FDA under an Emergency Use Authorization (EUA). This EUA will remain in effect (meaning this test can be used) for the duration of the COVID-19 declaration under Section 564(b)(1) of the Act, 21 U.S.C. section 360bbb-3(b)(1), unless the authorization is terminated or revoked.  Performed at [facility], [place] [address] [place], [place] [address]      Studies: No results found.   Domenic Polite, MD  [facility] 01/28/2021

## 2021-01-29 LAB — BASIC METABOLIC PANEL
Anion gap: 10 (ref 5–15)
BUN: 13 mg/dL (ref 8–23)
CO2: 26 mmol/L (ref 22–32)
Calcium: 9.8 mg/dL (ref 8.9–10.3)
Chloride: 98 mmol/L (ref 98–111)
Creatinine, Ser: 0.82 mg/dL (ref 0.44–1.00)
GFR, Estimated: >60 mL/min (ref >60)
Glucose, Bld: 98 mg/dL (ref 70–99)
Potassium: 4.9 mmol/L (ref 3.5–5.1)
Sodium: 134 mmol/L — ABNORMAL LOW (ref 135–145)

## 2021-01-29 LAB — RESP PANEL BY RT-PCR (FLU A&B, COVID) ARPGX2
Influenza A by PCR: NEGATIVE
Influenza B by PCR: NEGATIVE
SARS Coronavirus 2 by RT PCR: NEGATIVE

## 2021-01-29 MED ORDER — FUROSEMIDE 20 MG PO TABS: 20.0000 mg | ORAL_TABLET | Freq: Every day | ORAL | 2 refills | Status: AC

## 2021-01-29 MED ORDER — METOPROLOL SUCCINATE ER 25 MG PO TB24: 12.5000 mg | ORAL_TABLET | Freq: Every day | ORAL | 2 refills | Status: AC

## 2021-01-29 MED ORDER — SPIRONOLACTONE 25 MG PO TABS: 12.5000 mg | ORAL_TABLET | Freq: Every day | ORAL | 2 refills | Status: AC

## 2021-01-29 MED ORDER — LOSARTAN POTASSIUM 25 MG PO TABS
25.0000 mg | ORAL_TABLET | Freq: Every day | ORAL | 2 refills | Status: AC
Start: 2021-01-29 — End: ?

## 2021-01-29 NOTE — Progress Notes (Signed)
Manufacturing engineer [facility]) Hospital Liaison: RN note    Notified by Transition of Care Manger, Farris Has, CSW of patient/family request for [facility] services at [facility] after discharge. Chart and patient information under review by [facility] physician. Hospice eligibility pending currently.    Writer spoke with daughters, Laura Walker and Laura Walker to initiate education related to hospice philosophy, services and team approach to care. Family verbalized understanding of information given. Per discussion, plan is for discharge to [facility] by [place].   Please send signed and completed DNR form home with patient/family. Patient will need prescriptions for discharge comfort medications.     DME needs have been discussed, patient currently has the following equipment in the home:none.  Patient/family requests the following DME for delivery to the [facility] bed. [place] equipment manager has been notified and will contact DME provider to arrange delivery to the [facility].   [facility] Referral Center aware of the above. Please notify [facility] when patient is ready to leave the unit at discharge. (Call [phone] or [phone] after 5pm.) [facility] information and contact numbers given to family.      A Please do not hesitate to call with questions.    Thank you,   Farrel Gordon, RN, [place] (listed on [facility] under [place])    [phone]

## 2021-01-29 NOTE — TOC Progression Note (Addendum)
Transition of Care [facility]) - Progression Note    Patient Details  Name: Laura Walker MRN: [id number] Date of Birth: 1930/09/27  Transition of [facility] CM/SW [place], [place] Phone Number: 01/29/2021, 11:37 AM  Clinical Narrative:      Update 3/7 3:02pm- CSW heard from [place] with [facility] and Barrie Lyme confirmed that patient can DC over to ALF memory care  tomorrow with hospice services to follow. [facility] confirmed the hospital bed, [employer], and Wheelchair will be delivered to facility this afternoon.  CSW will continue to follow. Update 3/7 12:54pm- CSW spoke with Barrie Lyme with [facility] and she is waiting for district nurse to call her back to see when she can come in to train staff on how to use hoyer lift. Depending on what nurse says, patient may not be able to discharge today due to waiting to see when nurse can come in to train staff on how to use this equipment. CSW informed MD.  CSW received consult for hospice for patient at [facility] memory care ALF. CSW called [facility] and spoke with [facility]. Velta Addison confirmed she will reach out to family. CSW called [facility] and spoke with Barrie Lyme to update her that patients insurance was denied for SNF placement. That plan is now for patient to return to memory care with hospice. Barrie Lyme said that patient will need hospital bed, wheelchair, and hoyer lift. CSW called Velta Addison to let her know DME needs for patient. Velta Addison said she will place order, and that equipment should be delivered this afternoon. Barrie Lyme let CSW know that she needed to call head nurse to see if patient is able to DC over today. CSW awaiting callback.  CSW will continue to follow.        Expected Discharge Plan and Services           Expected Discharge Date: 01/29/21                                     Social Determinants of Health (SDOH) Interventions    Readmission Risk  Interventions No flowsheet data found.

## 2021-01-29 NOTE — Plan of Care (Signed)
  Problem: Education: Goal: Knowledge of General Education information will improve Description: Including pain rating scale, medication(s)/side effects and non-pharmacologic comfort measures Outcome: Progressing   Problem: Activity: Goal: Risk for activity intolerance will decrease Outcome: Progressing   Problem: Elimination: Goal: Will not experience complications related to bowel motility Outcome: Progressing Goal: Will not experience complications related to urinary retention Outcome: Progressing   Problem: Skin Integrity: Goal: Risk for impaired skin integrity will decrease Outcome: Progressing   Problem: Education: Goal: Individualized Educational Video(s) Outcome: Progressing   Problem: Activity: Goal: Ability to tolerate increased activity will improve Outcome: Progressing   Problem: Health Behavior/Discharge Planning: Goal: Ability to safely manage health-related needs after discharge will improve Outcome: Progressing

## 2021-01-29 NOTE — Discharge Summary (Addendum)
Physician Discharge Summary  LASHEKA KEMPNER EPP:[id number] DOB: June 06, 1930 DOA: 01/20/2021  PCP: [name], MD  Admit date: 01/20/2021 Discharge date: 01/30/2021  Admitted From: [facility].  Disposition:  [facility] with hospice care.   Recommendations for Outpatient Follow-up:  1. Follow up with PCP in 1-2 weeks    Discharge Condition: Stable.  CODE STATUS: DNR Diet recommendation: Heart Healthy   Brief/Interim Summary: Stacie Templin Powellis a 87 y.o.femalewith medical history significant for dementia, right upper lobe adenocarcinoma status post radiation 2021, chronic A. fib on Eliquis, CKD stage II, hypertension, OSA on CPAP, presented to the hospital with altered mental status and weakness. -In the ED she was noted to be hypoxic with sats of 88%, low-grade fever of 100.7 and A. fib RVR, chest x-ray was suspicious for pneumonia versus fluid overload -She was treated with antibiotics diuretics and IV Cardizem -Improved and stable however more debilitated. Palliative care consulted, met with family, plan to go back to [facility] with hospice care. MOST form was completed.   New onset systolic CHF -Echo with EF down to 20-25%  -Seen by cardiology in consultation, due to advanced age and dementia not a candidate for ischemic work-up  -Diuresed with IV Lasix, she is -5 L, volume status has improved considerably -continue Toprol, Aldactone and losartan at low doses  -Continue oral Lasix  -Continue discharge planning, short-term rehab recommended however this is not be an option -Palliative care consulted, plan for discharge to [facility] with hospice care.   A. fib with RVR -Heart rate improving, continue Toprol, digoxin discontinued -Started on Eliquis, however had history of considerable bleeding from her leg wounds,  after further discussion of risks benefits due to advanced age, bedridden status we decided to forego anticoagulation  Sepsis  poa Community-acquired pneumonia,  admitted with low-grade fevers hypoxia and concern for pneumonia along with fluid overload on x-ray -Completed antibiotic course using IV ceftriaxone and doxycycline  -Blood cultures are negative -Resolved  Dementia Metabolic encephalopathy -Mental status has improved, history of dementia.   -At baseline is mostly bed and wheelchair-bound  -Continue Namenda, duloxetine, donepezil -Required significant amount of assistance at ALF -Seen by PT, SNF recommended, social work consulted, short-term rehab declined for now  Acute hypoxic respiratory failure secondary to  Pneumonia/history of heart failure.Marland Kitchen Possibility of pneumonia with acute systolic heart failure and rapid A. Fib -Initially required BiPAP, now off -Continue with CPAP at HS -resolved   History of non-small cell lung cancer -Previously treated with radiation   AKI on CKDstage II -Improved and stable  Left lower extremity wounds.  Routine wound care  Elevated LFT from congestive hepatomegaly.  Improved./   Discharge Diagnoses:  Principal Problem:   Severe sepsis ([place]) Active Problems:   Left bundle branch block   Persistent atrial fibrillation (HCC)   A-fib (HCC)   Acute systolic CHF (congestive heart failure) (HCC)   CKD (chronic kidney disease), stage II    Discharge Instructions  Discharge Instructions    Diet - low sodium heart healthy   Complete by: As directed    Discharge wound care:   Complete by: As directed    See above   Increase activity slowly   Complete by: As directed      Allergies as of 01/29/2021      Reactions   Tramadol Nausea Only      Medication List    STOP taking these medications   apixaban 5 MG Tabs tablet Commonly known as: ELIQUIS     TAKE  these medications   buPROPion 75 MG tablet Commonly known as: WELLBUTRIN Take 75 mg by mouth every morning.   ciclopirox 8 % solution Commonly known as: PENLAC Apply 1 drop topically daily. On toenails   donepezil 10  MG tablet Commonly known as: ARICEPT TAKE 1 TABLET BY MOUTH AT BEDTIME   DULoxetine 30 MG capsule Commonly known as: CYMBALTA Take 30 mg by mouth daily.   Fish Oil Ultra 1400 MG Caps Take 1,400 mg by mouth daily.   furosemide 20 MG tablet Commonly known as: LASIX Take 1 tablet (20 mg total) by mouth daily.   losartan 25 MG tablet Commonly known as: COZAAR Take 1 tablet (25 mg total) by mouth daily.   meloxicam 7.5 MG tablet Commonly known as: MOBIC Take 15 mg by mouth as needed (knee pain).   memantine 10 MG tablet Commonly known as: NAMENDA Take 1 tablet by mouth twice daily   metoprolol succinate 25 MG 24 hr tablet Commonly known as: TOPROL-XL Take 0.5 tablets (12.5 mg total) by mouth daily.   nystatin powder Commonly known as: MYCOSTATIN/NYSTOP Apply 1 application topically 2 (two) times daily.   oxybutynin 5 MG 24 hr tablet Commonly known as: DITROPAN-XL Take 5 mg by mouth daily in the afternoon.   Red Yeast Rice 600 MG Caps Take 600 mg by mouth daily in the afternoon.   spironolactone 25 MG tablet Commonly known as: ALDACTONE Take 0.5 tablets (12.5 mg total) by mouth daily.   vitamin B-12 1000 MCG tablet Commonly known as: CYANOCOBALAMIN Take 1,000 mcg by mouth daily.   Vitamin D 50 MCG (2000 UT) tablet Take 2,000 Units by mouth daily.            Durable Medical Equipment  (From admission, onward)         Start     Ordered   01/29/21 0737  For home use only DME Other see comment  Once       Comments: Mechanical Lift.  Question:  Length of Need  Answer:  6 Months   01/29/21 0736   01/25/21 1441  For home use only DME Hospital bed  Once       Question Answer Comment  Length of Need 6 Months   Head must be elevated greater than: 45 degrees   Bed type Semi-electric      01/25/21 1441           Discharge Care Instructions  (From admission, onward)         Start     Ordered   01/29/21 0000  Discharge wound care:       Comments: See  above   01/29/21 0958          Follow-up Information    Richardson Dopp T, PA-C Follow up.   Specialties: Cardiology, Physician Assistant Why: Hospital follow-up visit scheduled for 02/13/2021 at 1:45pm with Richardson Dopp, one of Dr. Marlou Porch PAs. This will be a virtual visit as requested. If this date/time does not work for you, please call our office to reschedule. Contact information: [address] [place] [address] [phone]              Allergies  Allergen Reactions  . Tramadol Nausea Only    Consultations: Palliative care.   Procedures/Studies: CT HEAD WO CONTRAST  Result Date: 01/20/2021 CLINICAL DATA:  Mental status change. EXAM: CT HEAD WITHOUT CONTRAST TECHNIQUE: Contiguous axial images were obtained from the base of the skull through  the vertex without intravenous contrast. COMPARISON:  January 06, 2020 FINDINGS: Brain: No evidence of acute infarction, hemorrhage, hydrocephalus, extra-axial collection or mass lesion/mass effect. Extensive brain parenchymal volume loss and deep white matter microangiopathy. Punctate hypoattenuation in the left thalamus again noted. Vascular: Calcific atherosclerotic disease of the intra cavernous carotid arteries. Skull: Normal. Negative for fracture or focal lesion. Sinuses/Orbits: No acute finding. Other: None. IMPRESSION: 1. No acute intracranial abnormality. 2. Atrophy, chronic microvascular disease. Electronically Signed   By: Fidela Salisbury M.D.   On: 01/20/2021 13:24   CT CHEST WO CONTRAST  Result Date: 01/20/2021 CLINICAL DATA:  Cough, known lung cancer EXAM: CT CHEST WITHOUT CONTRAST TECHNIQUE: Multidetector CT imaging of the chest was performed following the standard protocol without IV contrast. COMPARISON:  December 20, 2020 FINDINGS: Cardiovascular: Cardiomegaly. Trace pericardial fluid. Three-vessel coronary artery atherosclerotic calcifications. Aortic valve calcifications. Atherosclerotic  calcifications of the aorta. Mediastinum/Nodes: Visualized thyroid is unremarkable. No new axillary or mediastinal adenopathy. Lungs/Pleura: Revisualization of an irregular RIGHT upper lobe paramediastinal masslike opacity; exact measurements are difficult to ascertain due to lack of IV contrast but is estimated to measure 4.2 x 2.7 by 5.0 cm (series 3, image 33; series 6, image 76). There may be a degree of superimposed atelectasis contributing to this measurement. Increased moderate bilateral pleural effusions. Increased bibasilar ground-glass opacities. Scattered bilateral atelectasis. Lunate configuration of the trachea. Upper Abdomen: Unchanged nodular thickening of bilateral adrenal glands. Small volume free fluid in the abdomen. Musculoskeletal: Degenerative changes of the thoracic spine. IMPRESSION: 1. Increased moderate bilateral pleural effusions. Increased bibasilar ground-glass opacities. Findings may reflect pulmonary edema or infection. 2. Revisualization of known RIGHT-sided lung cancer. It appears mildly more prominent in comparison to most recent prior, possibly due to superimposed atelectasis. Recommend close attention on follow-up. 3. Cardiomegaly. 4. Small volume free fluid in the abdomen. Aortic Atherosclerosis (ICD10-I70.0). Electronically Signed   By: Valentino Saxon MD   On: 01/20/2021 18:12   DG Chest Port 1 View  Result Date: 01/21/2021 CLINICAL DATA:  Shortness of breath and unresponsive. EXAM: PORTABLE CHEST 1 VIEW COMPARISON:  01/20/2021 FINDINGS: Patient slightly rotated to the right. Lungs are adequately inflated demonstrate stable left base/retrocardiac opacification likely small effusion with atelectasis. Stable mild hazy right base opacification likely small effusion with atelectasis. Infection in the lung bases is possible. Subtle prominence of the central pulmonary vessels likely a degree of vascular congestion. Borderline stable cardiomegaly. Remainder of the exam is  unchanged. IMPRESSION: 1. Stable bibasilar opacification left worse than right likely small effusions with atelectasis. Infection in the lung bases is possible. 2. Borderline stable cardiomegaly with suggestion of minimal vascular congestion. Electronically Signed   By: Marin Olp M.D.   On: 01/21/2021 08:35   DG Chest Port 1 View  Result Date: 01/20/2021 CLINICAL DATA:  Altered mental status beginning last night EXAM: PORTABLE CHEST 1 VIEW COMPARISON:  Chest CT December 20, 2020 and chest radiograph January 31, 2020. FINDINGS: The heart size and mediastinal contours are partially obscured. Right suprahilar nodular consolidation not significantly changed from chest CT December 20, 2020 given differences in technique. Slightly increased small-moderate left pleural effusion with a similar small right pleural effusion. Left basilar consolidation. Chronic parenchymal lung changes. The visualized skeletal structures are unchanged. IMPRESSION: 1. Similar small right and slightly increased small-moderate left pleural effusion with a right basilar consolidation, which may represent atelectasis or infection. 2. Right suprahilar nodular consolidation similar to chest CT December 20, 2020 given differences in technique. Electronically Signed  By: Dahlia Bailiff MD   On: 01/20/2021 12:27   ECHOCARDIOGRAM COMPLETE  Result Date: 01/21/2021    ECHOCARDIOGRAM REPORT   Patient Name:   [name] Date of Exam: 01/21/2021 Medical Rec #:  [id number]       Height:       64.0 in Accession #:    [id number]      Weight:       222.2 lb Date of Birth:  Jul 23, 1930       BSA:          2.046 m Patient Age:    87 years        BP:           116/80 mmHg Patient Gender: F               HR:           86 bpm. Exam Location:  Inpatient Procedure: 2D Echo, Cardiac Doppler, Color Doppler and Intracardiac            Opacification Agent Indications:    CHF-Acute Diastolic V40.08  History:        Patient has prior history of Echocardiogram  examinations, most                 recent 05/31/2020. Risk Factors:Dyslipidemia and Former Smoker.                 CKD.  Sonographer:    Vickie Epley RDCS Referring Phys: [id number] [place]  1. Left ventricular ejection fraction, by estimation, is 20 to 25%. The left ventricle has severely decreased function. The left ventricle demonstrates global hypokinesis. Left ventricular diastolic parameters are indeterminate.  2. Right ventricular systolic function is normal. The right ventricular size is mildly enlarged. There is normal pulmonary artery systolic pressure.  3. Left atrial size was mildly dilated.  4. Right atrial size was mildly dilated.  5. A small pericardial effusion is present. The pericardial effusion is circumferential. Large pleural effusion in the left lateral region.  6. The mitral valve is abnormal. Moderate mitral valve regurgitation.  7. The aortic valve is tricuspid. There is mild calcification of the aortic valve. There is mild thickening of the aortic valve. Aortic valve regurgitation is not visualized. No aortic stenosis is present.  8. The inferior vena cava is normal in size with <50% respiratory variability, suggesting right atrial pressure of 8 mmHg. FINDINGS  Left Ventricle: Left ventricular ejection fraction, by estimation, is 20 to 25%. The left ventricle has severely decreased function. The left ventricle demonstrates global hypokinesis. Definity contrast agent was given IV to delineate the left ventricular endocardial borders. The left ventricular internal cavity size was normal in size. There is no left ventricular hypertrophy. Left ventricular diastolic parameters are indeterminate. Right Ventricle: The right ventricular size is mildly enlarged. No increase in right ventricular wall thickness. Right ventricular systolic function is normal. There is normal pulmonary artery systolic pressure. The tricuspid regurgitant velocity is 2.46  m/s, and with an assumed right atrial  pressure of 8 mmHg, the estimated right ventricular systolic pressure is 93.2 mmHg. Left Atrium: Left atrial size was mildly dilated. Right Atrium: Right atrial size was mildly dilated. Pericardium: A small pericardial effusion is present. The pericardial effusion is circumferential. Mitral Valve: The mitral valve is abnormal. Moderate mitral valve regurgitation. Tricuspid Valve: The tricuspid valve is normal in structure. Tricuspid valve regurgitation is mild . No evidence of tricuspid stenosis. Aortic Valve: The aortic  valve is tricuspid. There is mild calcification of the aortic valve. There is mild thickening of the aortic valve. There is mild aortic valve annular calcification. Aortic valve regurgitation is not visualized. No aortic stenosis  is present. Aortic valve mean gradient measures 2.2 mmHg. Aortic valve peak gradient measures 4.5 mmHg. Aortic valve area, by VTI measures 2.03 cm. Pulmonic Valve: The pulmonic valve was not well visualized. Pulmonic valve regurgitation is not visualized. No evidence of pulmonic stenosis. Aorta: The aortic root is normal in size and structure. Pulmonary Artery: Moderate pulmonary HTN, PASP is 43 mmHg. Venous: The inferior vena cava is normal in size with less than 50% respiratory variability, suggesting right atrial pressure of 8 mmHg. IAS/Shunts: No atrial level shunt detected by color flow Doppler. Additional Comments: There is a large pleural effusion in the left lateral region.  LEFT VENTRICLE PLAX 2D LVIDd:         5.70 cm LVOT diam:     2.20 cm LV SV:         32 LV SV Index:   16 LVOT Area:     3.80 cm  RIGHT VENTRICLE TAPSE (M-mode): 1.6 cm LEFT ATRIUM             Index       RIGHT ATRIUM           Index LA Vol (A2C):   65.7 ml 32.11 ml/m RA Area:     22.40 cm LA Vol (A4C):   57.7 ml 28.20 ml/m RA Volume:   69.70 ml  34.07 ml/m LA Biplane Vol: 62.1 ml 30.35 ml/m  AORTIC VALVE AV Area (Vmax):    2.24 cm AV Area (Vmean):   2.41 cm AV Area (VTI):     2.03 cm  AV Vmax:           105.87 cm/s AV Vmean:          69.151 cm/s AV VTI:            0.159 m AV Peak Grad:      4.5 mmHg AV Mean Grad:      2.2 mmHg LVOT Vmax:         62.26 cm/s LVOT Vmean:        43.837 cm/s LVOT VTI:          0.085 m LVOT/AV VTI ratio: 0.54  AORTA Ao Root diam: 3.60 cm Ao Asc diam:  3.50 cm MR Peak grad: 70.6 mmHg   TRICUSPID VALVE MR Mean grad: 44.0 mmHg   TR Peak grad:   24.2 mmHg MR Vmax:      420.00 cm/s TR Vmax:        246.00 cm/s MR Vmean:     311.0 cm/s                           SHUNTS                           Systemic VTI:  0.09 m                           Systemic Diam: 2.20 cm Carlyle Dolly MD Electronically signed by Carlyle Dolly MD Signature Date/Time: 01/21/2021/2:26:43 PM    Final    US Abdomen Limited RUQ (LIVER/GB)  Result Date: 01/20/2021 CLINICAL DATA:  Elevated LFTs, assess for cholecystitis and biliary dilation EXAM: ULTRASOUND  ABDOMEN LIMITED RIGHT UPPER QUADRANT COMPARISON:  None. FINDINGS: Gallbladder: Cholelithiasis without pericholecystic fluid or wall thickening visualized. No sonographic Murphy sign noted by sonographer. Common bile duct: Diameter: 3 mm Liver: No focal lesion identified. Coarsened hepatic echotexture with diffusely increased parenchymal echogenicity. Portal vein is patent on color Doppler imaging with normal direction of blood flow towards the liver. Other: Smart pleural effusion. IMPRESSION: 1. Cholelithiasis without sonographic evidence of acute cholecystitis. 2. Coarsened hepatic echotexture with diffusely increased parenchymal echogenicity. This is a nonspecific finding but is most commonly seen with fatty infiltration of the liver. There are no obvious focal hepatic lesions. Electronically Signed   By: Dahlia Bailiff MD   On: 01/20/2021 16:35    Subjective: She is alert, denies pain.   Discharge Exam: Vitals:   01/29/21 0457 01/29/21 0835  BP: 122/75   Pulse: 80 73  Resp: 20 16  Temp: 99.2 F (37.3 C)   SpO2: 100% 98%      General: Pt is alert, awake, not in acute distress Cardiovascular: RRR, S1/S2 +, no rubs, no gallops Respiratory: CTA bilaterally, no wheezing, no rhonchi Abdominal: Soft, NT, ND, bowel sounds + Extremities: no edema, no cyanosis    The results of significant diagnostics from this hospitalization (including imaging, microbiology, ancillary and laboratory) are listed below for reference.     Microbiology: Recent Results (from the past 240 hour(s))  Blood Culture (routine x 2)     Status: None   Collection Time: 01/20/21 11:33 AM   Specimen: BLOOD  Result Value Ref Range Status   Specimen Description BLOOD LEFT ANTECUBITAL  Final   Special Requests   Final    BOTTLES DRAWN AEROBIC AND ANAEROBIC Blood Culture adequate volume   Culture   Final    NO GROWTH 5 DAYS Performed at [facility], [address] [place], [place] [address]    Report Status 01/25/2021 FINAL  Final  Blood Culture (routine x 2)     Status: None   Collection Time: 01/20/21 11:38 AM   Specimen: BLOOD  Result Value Ref Range Status   Specimen Description BLOOD BLOOD RIGHT HAND  Final   Special Requests   Final    AEROBIC BOTTLE ONLY Blood Culture results may not be optimal due to an inadequate volume of blood received in culture bottles   Culture   Final    NO GROWTH 5 DAYS Performed at [facility], [place] [address], [place], [place] [address]    Report Status 01/25/2021 FINAL  Final  Resp Panel by RT-PCR (Flu A&B, Covid) Nasopharyngeal Swab     Status: None   Collection Time: 01/20/21 12:01 PM   Specimen: Nasopharyngeal Swab; Nasopharyngeal(NP) swabs in vial transport medium  Result Value Ref Range Status   SARS Coronavirus 2 by RT PCR NEGATIVE NEGATIVE Final    Comment: (NOTE) SARS-CoV-2 target nucleic acids are NOT DETECTED.  The SARS-CoV-2 RNA is generally detectable in upper respiratory specimens during the acute phase of infection. The lowest concentration of SARS-CoV-2 viral  copies this assay can detect is 138 copies/mL. A negative result does not preclude SARS-Cov-2 infection and should not be used as the sole basis for treatment or other patient management decisions. A negative result may occur with  improper specimen collection/handling, submission of specimen other than nasopharyngeal swab, presence of viral mutation(s) within the areas targeted by this assay, and inadequate number of viral copies(<138 copies/mL). A negative result must be combined with clinical observations, patient history,  and epidemiological information. The expected result is Negative.  Fact Sheet for Patients:  EntrepreneurPulse.com.au  Fact Sheet for Healthcare Providers:  IncredibleEmployment.be  This test is no t yet approved or cleared by the [place] FDA and  has been authorized for detection and/or diagnosis of SARS-CoV-2 by FDA under an Emergency Use Authorization (EUA). This EUA will remain  in effect (meaning this test can be used) for the duration of the COVID-19 declaration under Section 564(b)(1) of the Act, 21 U.S.C.section 360bbb-3(b)(1), unless the authorization is terminated  or revoked sooner.       Influenza A by PCR NEGATIVE NEGATIVE Final   Influenza B by PCR NEGATIVE NEGATIVE Final    Comment: (NOTE) The Xpert Xpress SARS-CoV-2/FLU/RSV plus assay is intended as an aid in the diagnosis of influenza from Nasopharyngeal swab specimens and should not be used as a sole basis for treatment. Nasal washings and aspirates are unacceptable for Xpert Xpress SARS-CoV-2/FLU/RSV testing.  Fact Sheet for Patients: EntrepreneurPulse.com.au  Fact Sheet for Healthcare Providers: IncredibleEmployment.be  This test is not yet approved or cleared by the [place] FDA and has been authorized for detection and/or diagnosis of SARS-CoV-2 by FDA under an Emergency Use Authorization (EUA). This  EUA will remain in effect (meaning this test can be used) for the duration of the COVID-19 declaration under Section 564(b)(1) of the Act, 21 U.S.C. section 360bbb-3(b)(1), unless the authorization is terminated or revoked.  Performed at [facility], [place] [address] [place], [place] [address]   Resp Panel by RT-PCR (Flu A&B, Covid) Nasopharyngeal Swab     Status: None   Collection Time: 01/26/21 11:14 AM   Specimen: Nasopharyngeal Swab; Nasopharyngeal(NP) swabs in vial transport medium  Result Value Ref Range Status   SARS Coronavirus 2 by RT PCR NEGATIVE NEGATIVE Final    Comment: (NOTE) SARS-CoV-2 target nucleic acids are NOT DETECTED.  The SARS-CoV-2 RNA is generally detectable in upper respiratory specimens during the acute phase of infection. The lowest concentration of SARS-CoV-2 viral copies this assay can detect is 138 copies/mL. A negative result does not preclude SARS-Cov-2 infection and should not be used as the sole basis for treatment or other patient management decisions. A negative result may occur with  improper specimen collection/handling, submission of specimen other than nasopharyngeal swab, presence of viral mutation(s) within the areas targeted by this assay, and inadequate number of viral copies(<138 copies/mL). A negative result must be combined with clinical observations, patient history, and epidemiological information. The expected result is Negative.  Fact Sheet for Patients:  EntrepreneurPulse.com.au  Fact Sheet for Healthcare Providers:  IncredibleEmployment.be  This test is no t yet approved or cleared by the [place] FDA and  has been authorized for detection and/or diagnosis of SARS-CoV-2 by FDA under an Emergency Use Authorization (EUA). This EUA will remain  in effect (meaning this test can be used) for the duration of the COVID-19 declaration under Section 564(b)(1) of the Act, 21 U.S.C.section  360bbb-3(b)(1), unless the authorization is terminated  or revoked sooner.       Influenza A by PCR NEGATIVE NEGATIVE Final   Influenza B by PCR NEGATIVE NEGATIVE Final    Comment: (NOTE) The Xpert Xpress SARS-CoV-2/FLU/RSV plus assay is intended as an aid in the diagnosis of influenza from Nasopharyngeal swab specimens and should not be used as a sole basis for treatment. Nasal washings and aspirates are unacceptable for Xpert Xpress SARS-CoV-2/FLU/RSV testing.  Fact Sheet for Patients: EntrepreneurPulse.com.au  Fact Sheet for  Healthcare Providers: IncredibleEmployment.be  This test is not yet approved or cleared by the [place] and has been authorized for detection and/or diagnosis of SARS-CoV-2 by FDA under an Emergency Use Authorization (EUA). This EUA will remain in effect (meaning this test can be used) for the duration of the COVID-19 declaration under Section 564(b)(1) of the Act, 21 U.S.C. section 360bbb-3(b)(1), unless the authorization is terminated or revoked.  Performed at [facility], [place] [address], [place], [place] [address]      Labs: BNP (last 3 results) Recent Labs    01/20/21 1705 01/21/21 0234  BNP 1,608.7* 2,712.9*   Basic Metabolic Panel: Recent Labs  Lab 01/23/21 0237 01/24/21 0224 01/25/21 0807 01/29/21 0247  NA 141 141 138 134*  K 3.5 3.8 3.8 4.9  CL 103 102 98 98  CO2 '29 27 29 26  ' GLUCOSE 89 86 97 98  BUN '18 17 16 13  ' CREATININE 0.97 0.74 0.77 0.82  CALCIUM 9.3 9.6 9.5 9.8  MG 1.7 1.8  --   --   PHOS 3.2  --   --   --    Liver Function Tests: Recent Labs  Lab 01/23/21 0237 01/25/21 0807  AST 78* 54*  ALT 207* 135*  ALKPHOS 78 78  BILITOT 0.8 0.8  PROT 5.4* 5.5*  ALBUMIN 2.6* 2.7*   No results for input(s): LIPASE, AMYLASE in the last 168 hours. No results for input(s): AMMONIA in the last 168 hours. CBC: Recent Labs  Lab 01/23/21 0237 01/24/21 0224  WBC 7.9 7.2   HGB 12.9 12.9  HCT 40.5 40.5  MCV 92.0 91.4  PLT 237 255   Cardiac Enzymes: No results for input(s): CKTOTAL, CKMB, CKMBINDEX, TROPONINI in the last 168 hours. BNP: Invalid input(s): POCBNP CBG: No results for input(s): GLUCAP in the last 168 hours. D-Dimer No results for input(s): DDIMER in the last 72 hours. Hgb A1c No results for input(s): HGBA1C in the last 72 hours. Lipid Profile No results for input(s): CHOL, HDL, LDLCALC, TRIG, CHOLHDL, LDLDIRECT in the last 72 hours. Thyroid function studies No results for input(s): TSH, T4TOTAL, T3FREE, THYROIDAB in the last 72 hours.  Invalid input(s): FREET3 Anemia work up No results for input(s): VITAMINB12, FOLATE, FERRITIN, TIBC, IRON, RETICCTPCT in the last 72 hours. Urinalysis    Component Value Date/Time   COLORURINE AMBER (A) 01/20/2021 1630   APPEARANCEUR CLOUDY (A) 01/20/2021 1630   LABSPEC >1.046 (H) 01/20/2021 1630   PHURINE 5.0 01/20/2021 1630   GLUCOSEU NEGATIVE 01/20/2021 1630   HGBUR NEGATIVE 01/20/2021 1630   BILIRUBINUR SMALL (A) 01/20/2021 1630   KETONESUR NEGATIVE 01/20/2021 1630   PROTEINUR >=300 (A) 01/20/2021 1630   NITRITE NEGATIVE 01/20/2021 1630   LEUKOCYTESUR NEGATIVE 01/20/2021 1630   Sepsis Labs Invalid input(s): PROCALCITONIN,  WBC,  LACTICIDVEN Microbiology Recent Results (from the past 240 hour(s))  Blood Culture (routine x 2)     Status: None   Collection Time: 01/20/21 11:33 AM   Specimen: BLOOD  Result Value Ref Range Status   Specimen Description BLOOD LEFT ANTECUBITAL  Final   Special Requests   Final    BOTTLES DRAWN AEROBIC AND ANAEROBIC Blood Culture adequate volume   Culture   Final    NO GROWTH 5 DAYS Performed at [facility], [place] [address] [place], [place] [address]    Report Status 01/25/2021 FINAL  Final  Blood Culture (routine x 2)     Status: None   Collection Time:  01/20/21 11:38 AM   Specimen: BLOOD  Result Value Ref Range Status   Specimen Description  BLOOD BLOOD RIGHT HAND  Final   Special Requests   Final    AEROBIC BOTTLE ONLY Blood Culture results may not be optimal due to an inadequate volume of blood received in culture bottles   Culture   Final    NO GROWTH 5 DAYS Performed at [facility], [place] [address], [place], [place] [address]    Report Status 01/25/2021 FINAL  Final  Resp Panel by RT-PCR (Flu A&B, Covid) Nasopharyngeal Swab     Status: None   Collection Time: 01/20/21 12:01 PM   Specimen: Nasopharyngeal Swab; Nasopharyngeal(NP) swabs in vial transport medium  Result Value Ref Range Status   SARS Coronavirus 2 by RT PCR NEGATIVE NEGATIVE Final    Comment: (NOTE) SARS-CoV-2 target nucleic acids are NOT DETECTED.  The SARS-CoV-2 RNA is generally detectable in upper respiratory specimens during the acute phase of infection. The lowest concentration of SARS-CoV-2 viral copies this assay can detect is 138 copies/mL. A negative result does not preclude SARS-Cov-2 infection and should not be used as the sole basis for treatment or other patient management decisions. A negative result may occur with  improper specimen collection/handling, submission of specimen other than nasopharyngeal swab, presence of viral mutation(s) within the areas targeted by this assay, and inadequate number of viral copies(<138 copies/mL). A negative result must be combined with clinical observations, patient history, and epidemiological information. The expected result is Negative.  Fact Sheet for Patients:  EntrepreneurPulse.com.au  Fact Sheet for Healthcare Providers:  IncredibleEmployment.be  This test is no t yet approved or cleared by the [place] FDA and  has been authorized for detection and/or diagnosis of SARS-CoV-2 by FDA under an Emergency Use Authorization (EUA). This EUA will remain  in effect (meaning this test can be used) for the duration of the COVID-19 declaration under Section  564(b)(1) of the Act, 21 U.S.C.section 360bbb-3(b)(1), unless the authorization is terminated  or revoked sooner.       Influenza A by PCR NEGATIVE NEGATIVE Final   Influenza B by PCR NEGATIVE NEGATIVE Final    Comment: (NOTE) The Xpert Xpress SARS-CoV-2/FLU/RSV plus assay is intended as an aid in the diagnosis of influenza from Nasopharyngeal swab specimens and should not be used as a sole basis for treatment. Nasal washings and aspirates are unacceptable for Xpert Xpress SARS-CoV-2/FLU/RSV testing.  Fact Sheet for Patients: EntrepreneurPulse.com.au  Fact Sheet for Healthcare Providers: IncredibleEmployment.be  This test is not yet approved or cleared by the [place] FDA and has been authorized for detection and/or diagnosis of SARS-CoV-2 by FDA under an Emergency Use Authorization (EUA). This EUA will remain in effect (meaning this test can be used) for the duration of the COVID-19 declaration under Section 564(b)(1) of the Act, 21 U.S.C. section 360bbb-3(b)(1), unless the authorization is terminated or revoked.  Performed at [facility], [place] [address] [place], [place] [address]   Resp Panel by RT-PCR (Flu A&B, Covid) Nasopharyngeal Swab     Status: None   Collection Time: 01/26/21 11:14 AM   Specimen: Nasopharyngeal Swab; Nasopharyngeal(NP) swabs in vial transport medium  Result Value Ref Range Status   SARS Coronavirus 2 by RT PCR NEGATIVE NEGATIVE Final    Comment: (NOTE) SARS-CoV-2 target nucleic acids are NOT DETECTED.  The SARS-CoV-2 RNA is generally detectable in upper respiratory specimens during the acute phase of infection. The lowest concentration of SARS-CoV-2  viral copies this assay can detect is 138 copies/mL. A negative result does not preclude SARS-Cov-2 infection and should not be used as the sole basis for treatment or other patient management decisions. A negative result may occur with  improper  specimen collection/handling, submission of specimen other than nasopharyngeal swab, presence of viral mutation(s) within the areas targeted by this assay, and inadequate number of viral copies(<138 copies/mL). A negative result must be combined with clinical observations, patient history, and epidemiological information. The expected result is Negative.  Fact Sheet for Patients:  EntrepreneurPulse.com.au  Fact Sheet for Healthcare Providers:  IncredibleEmployment.be  This test is no t yet approved or cleared by the [place] FDA and  has been authorized for detection and/or diagnosis of SARS-CoV-2 by FDA under an Emergency Use Authorization (EUA). This EUA will remain  in effect (meaning this test can be used) for the duration of the COVID-19 declaration under Section 564(b)(1) of the Act, 21 U.S.C.section 360bbb-3(b)(1), unless the authorization is terminated  or revoked sooner.       Influenza A by PCR NEGATIVE NEGATIVE Final   Influenza B by PCR NEGATIVE NEGATIVE Final    Comment: (NOTE) The Xpert Xpress SARS-CoV-2/FLU/RSV plus assay is intended as an aid in the diagnosis of influenza from Nasopharyngeal swab specimens and should not be used as a sole basis for treatment. Nasal washings and aspirates are unacceptable for Xpert Xpress SARS-CoV-2/FLU/RSV testing.  Fact Sheet for Patients: EntrepreneurPulse.com.au  Fact Sheet for Healthcare Providers: IncredibleEmployment.be  This test is not yet approved or cleared by the [place] FDA and has been authorized for detection and/or diagnosis of SARS-CoV-2 by FDA under an Emergency Use Authorization (EUA). This EUA will remain in effect (meaning this test can be used) for the duration of the COVID-19 declaration under Section 564(b)(1) of the Act, 21 U.S.C. section 360bbb-3(b)(1), unless the authorization is terminated or revoked.  Performed at  [facility], [place] [address], [place], [place] [address]      Time coordinating discharge: 40 minutes  SIGNED:   Elmarie Shiley, MD  [facility]

## 2021-01-29 NOTE — Care Management Important Message (Signed)
Important Message  Patient Details  Name: Laura Walker MRN: [id number] Date of Birth: [birth year]   Medicare Important Message Given:  Yes     Laura Walker 01/29/2021, 10:11 AM

## 2021-01-30 NOTE — Progress Notes (Signed)
Physical Therapy Treatment Patient Details Name: Laura Walker MRN: [id number] DOB: Jul 31, 1930 Today's Date: 01/30/2021    History of Present Illness Pt is an 87 y.o. female admitted from ALF on 01/20/21 with AMS, weakness. Workup for sepsis, CAP, new onset systolic CHF, metabolic encephalopathy. PMH includes dememntia, RUL adenocarcinoma (s/p radiation 2021), afib on Eliquis, CKD 2, HTN, OSA on CPAP.   PT Comments    Pt keeping eyes closed majority of session, able to state name but inconsistently answering questions with minimal command following; question fatigue vs. cognitive impairment vs. desire to participate (pt conveniently keeping eyes closed when it comes time to mobilize). Pt requires maxA+2 for bed-level mobility, dependent for pericare with urine incontinence. When handed washcloth, pt able to wash face herself. Continue to recommend return to ALF with increased staff assist.   Follow Up Recommendations  Other - return to ALF with increased assist     Equipment Recommendations  Other Product manager lift)    Recommendations for Other Services       Precautions / Restrictions Precautions Precautions: Fall;Other (comment) Precaution Comments: Urine incontinence Restrictions Weight Bearing Restrictions: No    Mobility  Bed Mobility Overal bed mobility: Needs Assistance Bed Mobility: Rolling Rolling: Max assist;+2 for physical assistance         General bed mobility comments: MaxA+2 to roll R/L for pericare, pt able to use bed rail to assist when UE guided to it; maxA to maintain trunk rotation and totalA to position BLEs to maintain sidelying; deferred attempts to sit as pt keeping eyes closed and not consistently participating    Transfers                    Ambulation/Gait                 Stairs             Wheelchair Mobility    Modified Rankin (Stroke Patients Only)       Balance                                             Cognition Arousal/Alertness: Awake/alert Behavior During Therapy: Flat affect Overall Cognitive Status: History of cognitive impairments - at baseline                                 General Comments: Baseline dementia. Pt awake and able to state first name, although when asked to mobilize pt then keeping eyes closed majority of session, but does not appear asleep; pt would follow commands to roll for pericare, as well as use washcloth to wipe face, but then keeping eyes closed and not following other commands or answering questions.      Exercises Other Exercises Other Exercises: BLEs positioned to elevate heels    General Comments General comments (skin integrity, edema, etc.): Supine SpO2 94-100% on RA, HR 74, BP 104/65; noted skin tear on L forearm (notified RN). Pt dependent for all aspects of mobility and ADLs, including pericare, although was able to wash her face when handed cloth      Pertinent Vitals/Pain Pain Assessment: Faces Faces Pain Scale: No hurt Pain Intervention(s): Monitored during session    Home Living  Prior Function            PT Goals (current goals can now be found in the care plan section) Progress towards PT goals: Not progressing toward goals - comment (minimal participation, likely fatigue)    Frequency    Min 2X/week      PT Plan Current plan remains appropriate    Co-evaluation              AM-PAC PT "6 Clicks" Mobility   Outcome Measure  Help needed turning from your back to your side while in a flat bed without using bedrails?: Total Help needed moving from lying on your back to sitting on the side of a flat bed without using bedrails?: Total Help needed moving to and from a bed to a chair (including a wheelchair)?: Total Help needed standing up from a chair using your arms (e.g., wheelchair or bedside chair)?: Total Help needed to walk in hospital room?: Total Help needed  climbing 3-5 steps with a railing? : Total 6 Click Score: 6    End of Session   Activity Tolerance: Patient limited by fatigue Patient left: in bed;with call bell/phone within reach;with bed alarm set Nurse Communication: Mobility status;Need for lift equipment PT Visit Diagnosis: Other abnormalities of gait and mobility (R26.89);Muscle weakness (generalized) (M62.81)     Time: 6759-1638 PT Time Calculation (min) (ACUTE ONLY): 18 min  Charges:  $Therapeutic Activity: 8-22 mins                    Mabeline Caras, PT, DPT Acute Rehabilitation Services  Pager [phone] Office [place] 01/30/2021, 10:24 AM

## 2021-01-30 NOTE — TOC Transition Note (Addendum)
Transition of Care [facility]) - CM/SW Discharge Note   Patient Details  Name: Laura Walker MRN: [id number] Date of Birth: 1930/04/28  Transition of [facility]) CM/SW Contact:  Trula Ore, [place] Phone Number: 01/30/2021, 11:54 AM   Clinical Narrative:     Patient will DC to: [facility]  Anticipated DC date: 01/30/2021  Family notified: [name]  Transport by: Corey Harold (approval number [phone])  ?  Per MD patient ready for DC to [place] with [facility] to follow . RN, patient, patient's family, Audrea Muscat with [facility], and facility notified of DC. Discharge Summary sent to facility. Patient will go to [facility] memory care.Per ALF nurse does not need to call for report. DC packet on chart. DNR signed by MD attached to DC packet.Ambulance transport requested for patient.  CSW signing off.    Final next level of care: Assisted Living [facility]) Barriers to Discharge: No Barriers Identified   Patient Goals and CMS Choice   CMS Medicare.gov Compare Post Acute Care list provided to:: Patient Represenative (must comment) [name] daughter) Choice offered to / list presented to : Adult Children [name] Daughter)  Discharge Placement              Patient chooses bed at:  ([facility]) Patient to be transferred to facility by: [place] Name of family member notified: Kathie Patient and family notified of of transfer: 01/30/21  Discharge Plan and Services                                     Social Determinants of Health (SDOH) Interventions     Readmission Risk Interventions No flowsheet data found.

## 2021-02-01 DIAGNOSIS — I5033 Acute on chronic diastolic (congestive) heart failure: Secondary | ICD-10-CM | POA: Diagnosis not present

## 2021-02-01 DIAGNOSIS — D6869 Other thrombophilia: Secondary | ICD-10-CM | POA: Diagnosis not present

## 2021-02-01 DIAGNOSIS — I700 Atherosclerosis of aorta: Secondary | ICD-10-CM | POA: Diagnosis not present

## 2021-02-01 DIAGNOSIS — N2581 Secondary hyperparathyroidism of renal origin: Secondary | ICD-10-CM | POA: Diagnosis not present

## 2021-02-01 DIAGNOSIS — C3490 Malignant neoplasm of unspecified part of unspecified bronchus or lung: Secondary | ICD-10-CM | POA: Diagnosis not present

## 2021-02-01 DIAGNOSIS — F330 Major depressive disorder, recurrent, mild: Secondary | ICD-10-CM | POA: Diagnosis not present

## 2021-02-01 DIAGNOSIS — I4891 Unspecified atrial fibrillation: Secondary | ICD-10-CM | POA: Diagnosis not present

## 2021-02-01 DIAGNOSIS — D692 Other nonthrombocytopenic purpura: Secondary | ICD-10-CM | POA: Diagnosis not present

## 2021-02-01 DIAGNOSIS — N183 Chronic kidney disease, stage 3 unspecified: Secondary | ICD-10-CM | POA: Diagnosis not present

## 2021-02-02 DIAGNOSIS — I5033 Acute on chronic diastolic (congestive) heart failure: Secondary | ICD-10-CM | POA: Diagnosis not present

## 2021-02-02 DIAGNOSIS — I4891 Unspecified atrial fibrillation: Secondary | ICD-10-CM | POA: Diagnosis not present

## 2021-02-02 DIAGNOSIS — C3490 Malignant neoplasm of unspecified part of unspecified bronchus or lung: Secondary | ICD-10-CM | POA: Diagnosis not present

## 2021-02-05 DIAGNOSIS — R600 Localized edema: Secondary | ICD-10-CM | POA: Diagnosis not present

## 2021-02-05 DIAGNOSIS — I5020 Unspecified systolic (congestive) heart failure: Secondary | ICD-10-CM | POA: Diagnosis not present

## 2021-02-05 DIAGNOSIS — F0390 Unspecified dementia without behavioral disturbance: Secondary | ICD-10-CM | POA: Diagnosis not present

## 2021-02-05 DIAGNOSIS — N3281 Overactive bladder: Secondary | ICD-10-CM | POA: Diagnosis not present

## 2021-02-08 ENCOUNTER — Encounter: Payer: Self-pay | Admitting: Orthopedic Surgery

## 2021-02-13 ENCOUNTER — Telehealth: Payer: PPO | Admitting: Physician Assistant

## 2021-02-14 DIAGNOSIS — G4733 Obstructive sleep apnea (adult) (pediatric): Secondary | ICD-10-CM | POA: Diagnosis not present

## 2021-03-02 ENCOUNTER — Ambulatory Visit: Payer: PPO | Admitting: Orthopedic Surgery

## 2021-05-24 ENCOUNTER — Encounter: Payer: Self-pay | Admitting: Orthopedic Surgery

## 2021-05-24 ENCOUNTER — Encounter: Payer: Self-pay | Admitting: Internal Medicine

## 2021-05-25 DEATH — deceased

## 2021-06-15 NOTE — Telephone Encounter (Signed)
Thanks.  Note sent.
